# Patient Record
Sex: Female | Born: 1995 | Race: Black or African American | Hispanic: No | Marital: Single | Smoking: Never smoker
Health system: Southern US, Community
[De-identification: ages and names within clinical notes are randomized; demographics above are authoritative.]

## PROBLEM LIST (undated history)

## (undated) DIAGNOSIS — F99 Mental disorder, not otherwise specified: Secondary | ICD-10-CM

## (undated) DIAGNOSIS — J45909 Unspecified asthma, uncomplicated: Secondary | ICD-10-CM

## (undated) HISTORY — DX: Unspecified asthma, uncomplicated: J45.909

---
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
## 2015-01-01 ENCOUNTER — Ambulatory Visit (INDEPENDENT_AMBULATORY_CARE_PROVIDER_SITE_OTHER): Payer: PRIVATE HEALTH INSURANCE | Admitting: Certified Nurse Midwife

## 2015-01-01 ENCOUNTER — Encounter: Payer: Self-pay | Admitting: Certified Nurse Midwife

## 2015-01-01 ENCOUNTER — Encounter: Payer: Self-pay | Admitting: Obstetrics

## 2015-01-01 VITALS — BP 111/59 | HR 64 | Temp 98.2°F | Ht 64.0 in | Wt 155.0 lb

## 2015-01-01 DIAGNOSIS — Z862 Personal history of diseases of the blood and blood-forming organs and certain disorders involving the immune mechanism: Secondary | ICD-10-CM

## 2015-01-01 DIAGNOSIS — Z01419 Encounter for gynecological examination (general) (routine) without abnormal findings: Secondary | ICD-10-CM | POA: Diagnosis not present

## 2015-01-01 DIAGNOSIS — N939 Abnormal uterine and vaginal bleeding, unspecified: Secondary | ICD-10-CM

## 2015-01-01 DIAGNOSIS — F3281 Premenstrual dysphoric disorder: Secondary | ICD-10-CM

## 2015-01-01 DIAGNOSIS — Z113 Encounter for screening for infections with a predominantly sexual mode of transmission: Secondary | ICD-10-CM

## 2015-01-01 LAB — COMPREHENSIVE METABOLIC PANEL
ALT: 13 U/L (ref 5–32)
AST: 21 U/L (ref 12–32)
Albumin: 4.3 g/dL (ref 3.6–5.1)
Alkaline Phosphatase: 61 U/L (ref 47–176)
BUN: 11 mg/dL (ref 7–20)
CHLORIDE: 101 mmol/L (ref 98–110)
CO2: 25 mmol/L (ref 20–31)
CREATININE: 0.9 mg/dL (ref 0.50–1.00)
Calcium: 9.7 mg/dL (ref 8.9–10.4)
GLUCOSE: 77 mg/dL (ref 65–99)
Potassium: 3.8 mmol/L (ref 3.8–5.1)
Sodium: 136 mmol/L (ref 135–146)
Total Bilirubin: 0.5 mg/dL (ref 0.2–1.1)
Total Protein: 7.4 g/dL (ref 6.3–8.2)

## 2015-01-01 LAB — CBC WITH DIFFERENTIAL/PLATELET
BASOS PCT: 0 % (ref 0–1)
Basophils Absolute: 0 10*3/uL (ref 0.0–0.1)
EOS ABS: 0.1 10*3/uL (ref 0.0–0.7)
Eosinophils Relative: 1 % (ref 0–5)
HCT: 36.3 % (ref 36.0–46.0)
Hemoglobin: 11.9 g/dL — ABNORMAL LOW (ref 12.0–15.0)
Lymphocytes Relative: 31 % (ref 12–46)
Lymphs Abs: 2.6 10*3/uL (ref 0.7–4.0)
MCH: 29 pg (ref 26.0–34.0)
MCHC: 32.8 g/dL (ref 30.0–36.0)
MCV: 88.3 fL (ref 78.0–100.0)
MONO ABS: 0.4 10*3/uL (ref 0.1–1.0)
MONOS PCT: 5 % (ref 3–12)
MPV: 8.9 fL (ref 8.6–12.4)
NEUTROS PCT: 63 % (ref 43–77)
Neutro Abs: 5.3 10*3/uL (ref 1.7–7.7)
PLATELETS: 347 10*3/uL (ref 150–400)
RBC: 4.11 MIL/uL (ref 3.87–5.11)
RDW: 13.8 % (ref 11.5–15.5)
WBC: 8.4 10*3/uL (ref 4.0–10.5)

## 2015-01-01 LAB — TSH: TSH: 0.404 u[IU]/mL (ref 0.350–4.500)

## 2015-01-01 MED ORDER — FLUOXETINE HCL (PMDD) 20 MG PO TABS: 1.0000 | ORAL_TABLET | Freq: Every day | ORAL | Status: DC

## 2015-01-01 NOTE — Progress Notes (Signed)
Patient ID: Lynn Gonzales, female   DOB: 1995/10/25, 19 y.o.   MRN: [id number]    Subjective:      Lynn ParcelKerani Palmero is a 19 y.o. female here for a routine exam.  Current complaints: cramping with periods, has a hx of ovarian cysts.  Monthly periods, lasting about 7-9 days, soaking a pad/tampon in an hour for the first several days, clots about dime sized.  Does have a hx of anemia with her periods. Has not been on birth control since 2012.  Takes midol for the cramping, sometimes it helps sometimes it does not help with the pain.   Currently sexually active, does not desire pregnancy at this time.    Personal health questionnaire:  Is patient Ashkenazi Jewish, have a family history of breast and/or ovarian cancer: yes; MGM ovarian Is there a family history of uterine cancer diagnosed at age < 8050, gastrointestinal cancer, urinary tract cancer, family member who is a Personnel officerLynch syndrome-associated carrier: no Is the patient overweight and hypertensive, family history of diabetes, personal history of gestational diabetes, preeclampsia or PCOS: no Is patient over 3255, have PCOS,  family history of premature CHD under age 19, diabetes, smoke, have hypertension or peripheral artery disease:  DM MGM, PGM At any time, has a partner hit, kicked or otherwise hurt or frightened you?: no Over the past 2 weeks, have you felt down, depressed or hopeless?: yes when she is on her cycle Over the past 2 weeks, have you felt little interest or pleasure in doing things?:no   Gynecologic History Patient's last menstrual period was 11/20/2014. Contraception: none Last Pap: N/A.  Last mammogram: N/A.   Obstetric History OB History  Gravida Para Term Preterm AB SAB TAB Ectopic Multiple Living  0 0 0 0 0 0 0 0 0 0         Past Medical History  Diagnosis Date  . Asthma     Past Surgical History  Procedure Laterality Date  . Ovarian cyst removal Left 2011  . Tonsillectomy  2015  . Anterior cruciate ligament  repair Left 2015    No current outpatient prescriptions on file. No Known Allergies  Social History  Substance Use Topics  . Smoking status: Never Smoker   . Smokeless tobacco: Not on file  . Alcohol Use: 0.0 oz/week    0 Standard drinks or equivalent per week     Comment: Socially    History reviewed. No pertinent family history.    Review of Systems  Constitutional: negative for fatigue and weight loss Respiratory: negative for cough and wheezing Cardiovascular: negative for chest pain, fatigue and palpitations Gastrointestinal: negative for abdominal pain and change in bowel habits Musculoskeletal:negative for myalgias Neurological: negative for gait problems and tremors Behavioral/Psych: negative for abusive relationship, depression Endocrine: negative for temperature intolerance   Genitourinary:negative for abnormal menstrual periods, genital lesions, hot flashes, sexual problems and vaginal discharge Integument/breast: negative for breast lump, breast tenderness, nipple discharge and skin lesion(s)    Objective:       BP 111/59 mmHg  Pulse 64  Temp(Src) 98.2 F (36.8 C)  Ht 5\' 4"  (1.626 m)  Wt 155 lb (70.308 kg)  BMI 26.59 kg/m2  LMP 11/20/2014 General:   alert  Skin:   no rash or abnormalities  Lungs:   clear to auscultation bilaterally  Heart:   regular rate and rhythm, S1, S2 normal, no murmur, click, rub or gallop  Breasts:   normal without suspicious masses, skin or nipple changes or axillary  nodes  Abdomen:  normal findings: no organomegaly, soft, non-tender and no hernia  Pelvis:  External genitalia: normal general appearance Urinary system: urethral meatus normal and bladder without fullness, nontender Vaginal: normal without tenderness, induration or masses Cervix: normal appearance Adnexa: normal bimanual exam Uterus: anteverted and non-tender, normal size   Lab Review Urine pregnancy test Labs reviewed yes Radiologic studies reviewed no  50%  of 30 min visit spent on counseling and coordination of care.   Assessment:    Healthy female exam.    AUB  Contraception management  H/O ovarian cysts  H/O anemia Plan:    Education reviewed: depression evaluation, low fat, low cholesterol diet, safe sex/STD prevention, self breast exams, skin cancer screening and weight bearing exercise. Follow up in: 2 weeks. Skyla insertion   No orders of the defined types were placed in this encounter.   No orders of the defined types were placed in this encounter.   Possible management options include: continuous OCPs

## 2015-01-02 LAB — RPR

## 2015-01-02 LAB — HIV ANTIBODY (ROUTINE TESTING W REFLEX): HIV: NONREACTIVE

## 2015-01-02 LAB — HEPATITIS C ANTIBODY: HCV AB: NEGATIVE

## 2015-01-02 LAB — HEPATITIS B SURFACE ANTIGEN: Hepatitis B Surface Ag: NEGATIVE

## 2015-01-06 ENCOUNTER — Telehealth: Payer: Self-pay | Admitting: *Deleted

## 2015-01-06 ENCOUNTER — Other Ambulatory Visit: Payer: Self-pay | Admitting: Certified Nurse Midwife

## 2015-01-06 DIAGNOSIS — N760 Acute vaginitis: Secondary | ICD-10-CM

## 2015-01-06 DIAGNOSIS — B9689 Other specified bacterial agents as the cause of diseases classified elsewhere: Secondary | ICD-10-CM

## 2015-01-06 LAB — SURESWAB, VAGINOSIS/VAGINITIS PLUS
ATOPOBIUM VAGINAE: NOT DETECTED Log (cells/mL)
BV CATEGORY: UNDETERMINED — AB
C. albicans, DNA: NOT DETECTED
C. glabrata, DNA: NOT DETECTED
C. parapsilosis, DNA: NOT DETECTED
C. trachomatis RNA, TMA: NOT DETECTED
C. tropicalis, DNA: NOT DETECTED
GARDNERELLA VAGINALIS: 7.1 Log (cells/mL)
LACTOBACILLUS SPECIES: 7.4 Log (cells/mL)
MEGASPHAERA SPECIES: NOT DETECTED Log (cells/mL)
N. gonorrhoeae RNA, TMA: NOT DETECTED
T. VAGINALIS RNA, QL TMA: NOT DETECTED

## 2015-01-06 MED ORDER — METRONIDAZOLE 500 MG PO TABS: 500.0000 mg | ORAL_TABLET | Freq: Two times a day (BID) | ORAL | Status: DC

## 2015-01-06 NOTE — Telephone Encounter (Signed)
Patient is interested in the [place]inst[place]yla IUD. Per Rachelle Denney,CNM okay to schedule patient in 2 weeks from last appointment. Patient states she is currently sexually active and using condoms for protection. Patient states her last intercourse was 01-03-15. Patient advised no unprotected intercourse until after insertion of her device. Patient verbalized understanding. Patient has been scheduled for 01/15/15 @ 9:30 am.

## 2015-01-15 ENCOUNTER — Encounter: Payer: Self-pay | Admitting: Certified Nurse Midwife

## 2015-01-15 ENCOUNTER — Encounter: Payer: Self-pay | Admitting: Obstetrics

## 2015-01-15 ENCOUNTER — Ambulatory Visit (INDEPENDENT_AMBULATORY_CARE_PROVIDER_SITE_OTHER): Payer: PRIVATE HEALTH INSURANCE | Admitting: Certified Nurse Midwife

## 2015-01-15 VITALS — BP 114/58 | HR 52 | Temp 98.4°F | Wt 156.0 lb

## 2015-01-15 DIAGNOSIS — Z3202 Encounter for pregnancy test, result negative: Secondary | ICD-10-CM

## 2015-01-15 DIAGNOSIS — Z30430 Encounter for insertion of intrauterine contraceptive device: Secondary | ICD-10-CM | POA: Diagnosis not present

## 2015-01-15 DIAGNOSIS — Z01812 Encounter for preprocedural laboratory examination: Secondary | ICD-10-CM

## 2015-01-15 NOTE — Progress Notes (Signed)
Patient ID: Durward ParcelKerani Bless, female   DOB: [birth year], 19 y.o.   MRN: [id number]  IUD Procedure Note   DIAGNOSIS: Desires long-term, reversible contraception   PROCEDURE: IUD placement Performing Provider: Dory HornAmy Wren CNM  Patient counseled prior to procedure. I explained risks and benefits of Skyla IUD, reviewed alternative forms of contraception. Patient stated understanding and consented to continue with procedure.   LMP: 01/08/15 Pregnancy Test: Negative Lot #: [id number] Expiration Date: 02/17   IUD type: [   ] Mirena   [   ] Paraguard  [ X ] Skyla  PROCEDURE:  Timeout procedure was performed to ensure right patient and right site.  A bimanual exam was performed to determine the position of the uterus, retroverted. The speculum was placed. The vagina and cervix was sterilized in the usual manner and sterile technique was maintained throughout the course of the procedure. A single toothed tenaculum was applied to the posterior lip of the cervix and gentle traction applied. The depth of the uterus was sounded to 8 cm. With gentle traction on the tenaculum, the IUD was inserted to the appropriate depth and inserted without difficulty.  The string was cut to an estimated 4 cm length. Bleeding was minimal. The patient tolerated the procedure well.   Follow up: The patient tolerated the procedure well without complications.  Standard post-procedure care is explained and return precautions are given.  Orvilla Cornwallachelle Ranay Ketter CNM

## 2015-02-21 ENCOUNTER — Ambulatory Visit (INDEPENDENT_AMBULATORY_CARE_PROVIDER_SITE_OTHER): Payer: PRIVATE HEALTH INSURANCE | Admitting: Certified Nurse Midwife

## 2015-02-21 ENCOUNTER — Ambulatory Visit: Payer: PRIVATE HEALTH INSURANCE | Admitting: Certified Nurse Midwife

## 2015-02-21 ENCOUNTER — Encounter: Payer: Self-pay | Admitting: Certified Nurse Midwife

## 2015-02-21 VITALS — BP 111/72 | HR 55 | Temp 98.2°F | Wt 160.0 lb

## 2015-02-21 DIAGNOSIS — Z975 Presence of (intrauterine) contraceptive device: Secondary | ICD-10-CM

## 2015-02-21 DIAGNOSIS — Z30431 Encounter for routine checking of intrauterine contraceptive device: Secondary | ICD-10-CM | POA: Diagnosis not present

## 2015-02-21 NOTE — Progress Notes (Signed)
Patient ID: Lynn Gonzales, female   DOB: 1996/02/06, 20 y.o.   MRN: [id number]  Chief Complaint  Patient presents with  . Follow-up    skyla, bleeding since inserted, now spotting, some cramping    HPI Lynn ParcelKerani Oquendo is a 20 y.o. female.  Here for f/u for her Skyla IUD.  Patient states that she likes the [place]yla.  Reports prolonged first period of about 18 days, denies any heavy bleeding and has been spotting since.  Her dysmenorrhea was worse since the IUD was placed with increased cramping the week before her period and then continuing the week after her period.  Has not taken anything for the cramping/bleeding.  Encouragement given to the patient about the normalcy of the spotting/cramping and to give the device about 6 months.  If she is continuing to have troublesome bleeding/cramping with the motrin to call the office.    HPI  Past Medical History  Diagnosis Date  . Asthma     Past Surgical History  Procedure Laterality Date  . Ovarian cyst removal Left 2011  . Tonsillectomy  2015  . Anterior cruciate ligament repair Left 2015    No family history on file.  Social History Social History  Substance Use Topics  . Smoking status: Never Smoker   . Smokeless tobacco: Not on file  . Alcohol Use: 0.0 oz/week    0 Standard drinks or equivalent per week     Comment: Socially    No Known Allergies  Current Outpatient Prescriptions  Medication Sig Dispense Refill  . Fluoxetine HCl, PMDD, (SARAFEM) 20 MG TABS Take 1 tablet (20 mg total) by mouth daily. For the 2 weeks around your period (Patient not taking: Reported on 01/15/2015) 30 each 12   No current facility-administered medications for this visit.    Review of Systems Review of Systems Constitutional: negative for fatigue and weight loss Respiratory: negative for cough and wheezing Cardiovascular: negative for chest pain, fatigue and palpitations Gastrointestinal: negative for abdominal pain and change in bowel  habits Genitourinary: +spotting Integument/breast: negative for nipple discharge Musculoskeletal:negative for myalgias Neurological: negative for gait problems and tremors Behavioral/Psych: negative for abusive relationship, depression Endocrine: negative for temperature intolerance     Blood pressure 111/72, pulse 55, temperature 98.2 F (36.8 C), weight 160 lb (72.576 kg), last menstrual period 02/03/2015.  Physical Exam Physical Exam General:   alert  Skin:   no rash or abnormalities  Lungs:   clear to auscultation bilaterally  Heart:   regular rate and rhythm, S1, S2 normal, no murmur, click, rub or gallop  Breasts:   deferred  Abdomen:  normal findings: no organomegaly, soft, non-tender and no hernia  Pelvis:  External genitalia: normal general appearance Urinary system: urethral meatus normal and bladder without fullness, nontender Vaginal: normal without tenderness, induration or masses Cervix: normal appearance, +IUD strings present Adnexa: normal bimanual exam Uterus: anteverted and non-tender, normal size    50% of 15 min visit spent on counseling and coordination of care.   Data Reviewed Previous medical hx, meds  Assessment     IUD in place     Plan    No orders of the defined types were placed in this encounter.   No orders of the defined types were placed in this encounter.     Possible management options include: additional OCPs to stop bleeding.   Follow up as needed.

## 2015-03-31 ENCOUNTER — Ambulatory Visit (HOSPITAL_COMMUNITY)
Admission: RE | Admit: 2015-03-31 | Discharge: 2015-03-31 | Disposition: A | Discharge status: Home / Self Care | Payer: PRIVATE HEALTH INSURANCE | Attending: Psychiatry | Admitting: Psychiatry

## 2015-03-31 DIAGNOSIS — F331 Major depressive disorder, recurrent, moderate: Secondary | ICD-10-CM | POA: Diagnosis not present

## 2015-03-31 DIAGNOSIS — F419 Anxiety disorder, unspecified: Secondary | ICD-10-CM | POA: Diagnosis not present

## 2015-03-31 DIAGNOSIS — J45909 Unspecified asthma, uncomplicated: Secondary | ICD-10-CM | POA: Diagnosis not present

## 2015-03-31 NOTE — BH Assessment (Signed)
Tele Assessment Note   Lynn Gonzales is an 20 y.o. female.  -Patient presents as a walk-in to [facility].  She is unaccompanied.  Patient said that over the last two weeks she has had increased depression and anxiety.  She has had some suicidal thoughts but no intention or plan.  She says it is thoughts like "not wanting to be here."  She said it is more of a feeling that she wants to be rid of her anxiety and depression.  She has had one suicide attempt two years ago.  She did have thoughts and intention & plan back in July but went to see her current outpatient counselor and did not come inpatient.  Patient denies any HI or A/V hallucinations.  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  She lives off campus with a roommate that she does not have a close relationship with.  Patient is having anxiety over test taking and that some of her grades are slipping.  Patient says that she averages about two panic attacks per week but that today she had four.  Patient has had outpatient therapy since high school.  She has a Social worker at [facility] counseling.  She has an appointment tomorrow (02/14) at 15:30.  Patient was at a [facility] in [place] for inpatient care two years ago after a suicide attempt.  Patient is wanting to get outpatient referrals for psychiatry.  She thinks that an anti-anxiety medication may help her.  Patient is not interested in inpt care at this time.  -Clinician discussed patient care with Donell Sievert, PA.  He did not think that patient meets inpatient care criteria.  Recommended outpatient referrals for psychiatry.  Patient was given outpatient referrals and said she will contact the provider of her choice tomorrow.  Diagnosis: MDD recurrent, moderate; Anxiety D/O   Past Medical History:  Past Medical History  Diagnosis Date  . Asthma     Past Surgical History  Procedure Laterality Date  . Ovarian cyst removal  Left 2011  . Tonsillectomy  2015  . Anterior cruciate ligament repair Left 2015    Family History: No family history on file.  Social History:  reports that she has never smoked. She does not have any smokeless tobacco history on file. She reports that she drinks alcohol. She reports that she does not use illicit drugs.  Additional Social History:  Alcohol / Drug Use Pain Medications: None Prescriptions: None Over the Counter: N/A History of alcohol / drug use?: No history of alcohol / drug abuse  CIWA:   COWS:    PATIENT STRENGTHS: (choose at least two) Ability for insight Active sense of humor Average or above average intelligence Capable of independent living Communication skills General fund of knowledge Motivation for treatment/growth Supportive family/friends  Allergies: No Known Allergies  Home Medications:  (Not in a hospital admission)  OB/GYN Status:  No LMP recorded.  General Assessment Data Location of Assessment[facility] TTS Assessment: In system Is this a Tele or Face-to-Face Assessment?: Face-to-Face Is this an Initial Assessment or a Re-assessment for this encounter?: Initial Assessment Marital status: Single Is patient pregnant?: No Pregnancy Status: No Living Arrangements: Non-relatives/Friends (Roommate.  Lives off campus from [employer]) Can pt return to current living arrangement?: Yes Admission Status: Voluntary Is patient capable of signing voluntary admission?: Yes Referral Source: Self/Family/Friend Insurance type: GHI  Medical Screening Exam [facility] Walk-in ONLY) Medical Exam completed: No Reason for  MSE not completed: Patient Refused (Pt signed a MSE declination form.)  Crisis Care Plan Living Arrangements: Non-relatives/Friends (Roommate.  Lives off campus from [employer]) Name of Psychiatrist: None Name of Therapist: counselor [place] w/ [employer] student counseling  Education Status Is patient currently in school?: Yes Current  Grade: Junior year Name of school: [employer] Contact person: patient  Risk to self with the past 6 months Suicidal Ideation: No-Not Currently/Within Last 6 Months Has patient been a risk to self within the past 6 months prior to admission? : No Suicidal Intent: No-Not Currently/Within Last 6 Months Has patient had any suicidal intent within the past 6 months prior to admission? : Yes Is patient at risk for suicide?: No Suicidal Plan?: No-Not Currently/Within Last 6 Months Has patient had any suicidal plan within the past 6 months prior to admission? : Yes Access to Means: No What has been your use of drugs/alcohol within the last 12 months?: Pt denies Previous Attempts/Gestures: Yes How many times?: 1 Other Self Harm Risks: None Triggers for Past Attempts: Family contact, Other (Comment) (School pressure.) Intentional Self Injurious Behavior: None Family Suicide History: No Recent stressful life event(s): Other (Comment) (School pressure) Persecutory voices/beliefs?: No Depression: Yes Depression Symptoms: Despondent, Tearfulness, Isolating, Loss of interest in usual pleasures, Feeling angry/irritable Substance abuse history and/or treatment for substance abuse?: No Suicide prevention information given to non-admitted patients: Not applicable  Risk to Others within the past 6 months Homicidal Ideation: No Does patient have any lifetime risk of violence toward others beyond the six months prior to admission? : No Thoughts of Harm to Others: No Current Homicidal Intent: No Current Homicidal Plan: No Access to Homicidal Means: No Identified Victim: No one History of harm to others?: No Assessment of Violence: None Noted Violent Behavior Description: Pt denies Does patient have access to weapons?: No Criminal Charges Pending?: No Does patient have a court date: No Is patient on probation?: No  Psychosis Hallucinations: None noted Delusions: None noted  Mental Status  Report Appearance/Hygiene: Unremarkable Eye Contact: Good Motor Activity: Freedom of movement, Unremarkable Speech: Logical/coherent Level of Consciousness: Alert Mood: Depressed, Anxious Affect: Anxious, Sad Anxiety Level: Panic Attacks Panic attack frequency: 2x/W. Most recent panic attack: Four today Thought Processes: Coherent, Relevant Judgement: Unimpaired Orientation: Person, Place, Time, Situation, Appropriate for developmental age Obsessive Compulsive Thoughts/Behaviors: None  Cognitive Functioning Concentration: Decreased Memory: Recent Intact, Remote Intact IQ: Above Average Insight: Good Impulse Control: Good Appetite: Fair Weight Loss: 0 Weight Gain: 0 Sleep: No Change Total Hours of Sleep:  (4H/D last week.  8-9 this week.) Vegetative Symptoms: None  ADLScreening [facility] Assessment Services) Patient's cognitive ability adequate to safely complete daily activities?: Yes Patient able to express need for assistance with ADLs?: Yes Independently performs ADLs?: Yes (appropriate for developmental age)  Prior Inpatient Therapy Prior Inpatient Therapy: Yes Prior Therapy Dates: 2 years ago Prior Therapy Facilty/Provider(s): [facility] in [place] Reason for Treatment: SI.  Attempted overdose  Prior Outpatient Therapy Prior Outpatient Therapy: Yes Prior Therapy Dates: Since high school; current since July '16 Prior Therapy Facilty/Provider(s): Various counselors; Copy at [facility] counseling Reason for Treatment: Depression & anxiety Does patient have an ACCT team?: No Does patient have Intensive In-House Services?  : No Does patient have [place] services? : No Does patient have P4CC services?: No  ADL Screening (condition at time of admission) Patient's cognitive ability adequate to safely complete daily activities?: Yes Is the patient deaf or have difficulty hearing?: No Does the patient have  difficulty seeing, even when wearing  glasses/contacts?: No Does the patient have difficulty concentrating, remembering, or making decisions?: No Patient able to express need for assistance with ADLs?: Yes Does the patient have difficulty dressing or bathing?: No Independently performs ADLs?: Yes (appropriate for developmental age) Does the patient have difficulty walking or climbing stairs?: No Weakness of Legs: None Weakness of Arms/Hands: None       Abuse/Neglect Assessment (Assessment to be complete while patient is alone) Physical Abuse: Denies Verbal Abuse: Denies Sexual Abuse: Denies Exploitation of patient/patient's resources: Denies Self-Neglect: Denies     Merchant navy officer (For Healthcare) Does patient have an advance directive?: No Would patient like information on creating an advanced directive?: No - patient declined information    Additional Information 1:1 In Past 12 Months?: No CIRT Risk: No Elopement Risk: No Does patient have medical clearance?: Yes     Disposition:  Disposition Initial Assessment Completed for this Encounter: Yes Disposition of Patient: Outpatient treatment (Pt given outpatient referrals for psychiatry.) Type of outpatient treatment: Adult  Alexandria Lodge 03/31/2015 10:53 PM

## 2015-06-16 ENCOUNTER — Telehealth: Payer: Self-pay | Admitting: *Deleted

## 2015-06-16 NOTE — Telephone Encounter (Signed)
[facility] and her bleeding. 5:43 Mailbox is full- no message left

## 2015-06-17 ENCOUNTER — Other Ambulatory Visit: Payer: Self-pay | Admitting: Certified Nurse Midwife

## 2015-06-17 NOTE — Telephone Encounter (Signed)
Patient states she is having issues with her Skyla. She has been continuously bleeding for 3 months. Patient has bled daily. Patient is using around 4 super tampons a day. Patient has had Skyla since November- bleeding stopped for about 1 month. Patient needs to stop bleeding. Told her I would speak with her provider and get back to her on a plan.

## 2015-06-17 NOTE — Telephone Encounter (Signed)
9:23 Patient called back- left message. 11:30 Mailbox is full- can't leave message.

## 2015-06-24 ENCOUNTER — Telehealth: Payer: Self-pay | Admitting: *Deleted

## 2015-06-24 NOTE — Telephone Encounter (Signed)
Please offer either LOLO or nuva ring.  Thank you.

## 2015-06-24 NOTE — Telephone Encounter (Signed)
Patient called for a response about what to do about her constant bleeding. 5/9 10:11 Spoke with Boykin Reaperachelle- Choices for patient are Provera, OCP or Nuva ring. All depends on how heavy the bleeding is.  Attempt to call patient- mailbox full. Lo Lo if bleeding is not heavy per Rachelle.

## 2015-07-17 ENCOUNTER — Telehealth: Payer: Self-pay | Admitting: *Deleted

## 2015-07-17 NOTE — Telephone Encounter (Signed)
Patient reports she is still bleeding Skyla/OCP. 5:10 Patient states the bleeding only stopped 1 day- she is using LOLOEstrin and she is having SE- dizziness, decreased appetite, and emotional, acne increase. Patient is just starting the 3rd week- bleeding is lighter.

## 2015-07-18 ENCOUNTER — Other Ambulatory Visit: Payer: Self-pay | Admitting: Certified Nurse Midwife

## 2015-07-18 DIAGNOSIS — N926 Irregular menstruation, unspecified: Secondary | ICD-10-CM

## 2015-07-18 DIAGNOSIS — L709 Acne, unspecified: Secondary | ICD-10-CM

## 2015-07-18 MED ORDER — DOXYCYCLINE HYCLATE 50 MG PO CAPS: 50.0000 mg | ORAL_CAPSULE | Freq: Two times a day (BID) | ORAL | Status: DC

## 2015-07-18 NOTE — Telephone Encounter (Signed)
Hi; Please let her know that I have sent in doxycycline for the irregular bleeding with Skyla and to stop the LoLo.   Thank you.  R.Bluma Buresh CNM

## 2015-08-01 NOTE — Telephone Encounter (Signed)
Lynn SquibbJane Can you follow up with pt regarding bleeding.

## 2015-08-05 NOTE — Telephone Encounter (Signed)
Attemted to call patient- call dropped - then called her back and got mailbox full message.

## 2015-09-15 ENCOUNTER — Inpatient Hospital Stay (HOSPITAL_COMMUNITY)
Admission: EM | Admit: 2015-09-15 | Discharge: 2015-09-17 | DRG: 918 | Disposition: A | Discharge status: Other Acute Inpatient Hospital | Payer: BLUE CROSS/BLUE SHIELD | Attending: Internal Medicine | Admitting: Internal Medicine

## 2015-09-15 ENCOUNTER — Encounter (HOSPITAL_COMMUNITY): Payer: Self-pay | Admitting: *Deleted

## 2015-09-15 DIAGNOSIS — R45851 Suicidal ideations: Secondary | ICD-10-CM

## 2015-09-15 DIAGNOSIS — F909 Attention-deficit hyperactivity disorder, unspecified type: Secondary | ICD-10-CM | POA: Diagnosis present

## 2015-09-15 DIAGNOSIS — T1491 Suicide attempt: Secondary | ICD-10-CM | POA: Diagnosis not present

## 2015-09-15 DIAGNOSIS — F339 Major depressive disorder, recurrent, unspecified: Secondary | ICD-10-CM | POA: Diagnosis present

## 2015-09-15 DIAGNOSIS — T1491XA Suicide attempt, initial encounter: Secondary | ICD-10-CM

## 2015-09-15 DIAGNOSIS — T6592XA Toxic effect of unspecified substance, intentional self-harm, initial encounter: Principal | ICD-10-CM | POA: Diagnosis present

## 2015-09-15 DIAGNOSIS — T6591XA Toxic effect of unspecified substance, accidental (unintentional), initial encounter: Secondary | ICD-10-CM | POA: Diagnosis present

## 2015-09-15 HISTORY — DX: Mental disorder, not otherwise specified: F99

## 2015-09-15 LAB — I-STAT ARTERIAL BLOOD GAS, ED
ACID-BASE DEFICIT: 1 mmol/L (ref 0.0–2.0)
BICARBONATE: 24.1 meq/L — AB (ref 20.0–24.0)
O2 Saturation: 97 %
PH ART: 7.397 (ref 7.350–7.450)
TCO2: 25 mmol/L (ref 0–100)
pCO2 arterial: 39.2 mmHg (ref 35.0–45.0)
pO2, Arterial: 90 mmHg (ref 80.0–100.0)

## 2015-09-15 LAB — POC URINE PREG, ED: PREG TEST UR: NEGATIVE

## 2015-09-15 MED ORDER — SODIUM CHLORIDE 0.9 % IV SOLN
INTRAVENOUS | Status: DC
  Administered 2015-09-15: via INTRAVENOUS

## 2015-09-15 MED ORDER — THIAMINE HCL 100 MG/ML IJ SOLN
100.0000 mg | Freq: Four times a day (QID) | INTRAMUSCULAR | Status: DC
  Administered 2015-09-16 – 2015-09-17 (×6): 100 mg via INTRAVENOUS
  Filled 2015-09-15 (×6): qty 2

## 2015-09-15 MED ORDER — PYRIDOXINE HCL 100 MG/ML IJ SOLN
100.0000 mg | Freq: Four times a day (QID) | INTRAMUSCULAR | Status: DC
  Filled 2015-09-15 (×2): qty 1

## 2015-09-15 MED ORDER — SODIUM CHLORIDE 0.9 % IV SOLN
1000.0000 mg | Freq: Once | INTRAVENOUS | Status: AC
  Administered 2015-09-16: 1000 mg via INTRAVENOUS
  Filled 2015-09-15: qty 1

## 2015-09-15 NOTE — ED Triage Notes (Signed)
The pt has no pain or discomfort  She has no burns on her tongue  Salivation is normal  Sitter requested   Chg rn notified of sitter need  Pt undressed and placed into  [place] scrubs    Pt discussed with dr ward  Pt smiling happy

## 2015-09-15 NOTE — ED Notes (Signed)
EDP at bedside  

## 2015-09-15 NOTE — ED Triage Notes (Signed)
The pt rfeports that she attempted suic ide 4 hours ago  By ingesting antifreeze  She has no pain anywhere and she s not sure how much or if she drank  lmp July 3rd

## 2015-09-15 NOTE — ED Provider Notes (Addendum)
By signing my name below, I, Vista Mink, attest that this documentation has been prepared under the direction and in the presence of Garrie Woodin N Briannon Boggio, DO. Electronically signed, Vista Mink, ED Scribe. 09/15/15. 11:44 PM.  TIME SEEN: 11:33 PM  CHIEF COMPLAINT: SI  HPI:  HPI Comments: Lynn Gonzales is a 20 y.o. female, who presents to the Emergency Department s/p a suicide attempt that occurred at around 1900 this evening. Pt states she drank approximately 8oz of antifreeze to attempt to kill herself; denies ingesting any other substances. Pt states she called her parents who told her to go to the hospital. Pt reports she had a previous suicide attempt in 2013 by attempting to overdose. Pt states she currently takes Adderall prn. Pt currently complains of a mild headache but denies any other pain. Pt denies any drug or alcohol use. Pt denies any changes in vision, nausea, vomiting, diarrhea, chest pain, shortness of breath, abdominal pain.  Levin Bacon Burkette: Psychiatrist Ms. Fayrene Fearing: Therapist PCP - in [place]   ROS: See HPI Constitutional: no fever  Eyes: no drainage  ENT: no runny nose   Cardiovascular:  no chest pain  Resp: no SOB  GI: no vomiting GU: no dysuria Integumentary: no rash  Allergy: no hives  Musculoskeletal: no leg swelling  Neurological: no slurred speech ROS otherwise negative  PAST MEDICAL HISTORY/PAST SURGICAL HISTORY:  Past Medical History:  Diagnosis Date  . Asthma   . Mental disorder     MEDICATIONS:  Prior to Admission medications   Medication Sig Start Date End Date Taking? Authorizing Provider  doxycycline (VIBRAMYCIN) 50 MG capsule Take 1 capsule (50 mg total) by mouth 2 (two) times daily. 07/18/15   Rachelle A Denney, CNM  Fluoxetine HCl, PMDD, (SARAFEM) 20 MG TABS Take 1 tablet (20 mg total) by mouth daily. For the 2 weeks around your period Patient not taking: Reported on 01/15/2015 01/01/15   Roe Coombs, CNM    ALLERGIES:  No Known  Allergies  SOCIAL HISTORY:  Social History  Substance Use Topics  . Smoking status: Never Smoker  . Smokeless tobacco: Never Used  . Alcohol use 0.0 oz/week     Comment: Socially    FAMILY HISTORY: No family history on file.  EXAM: BP 123/70   Pulse 60   Temp 98.6 F (37 C)   Resp 16   Ht  (1.626 m)   Wt 156 lb (70.8 kg)   LMP 09/08/2015   SpO2 99%   BMI 26.78 kg/m  CONSTITUTIONAL: Alert and oriented and responds appropriately to questions. Well-appearing; well-nourished HEAD: Normocephalic EYES: Conjunctivae clear, PERRL ENT: normal nose; no rhinorrhea; moist mucous membranes NECK: Supple, no meningismus, no LAD  CARD: RRR; S1 and S2 appreciated; no murmurs, no clicks, no rubs, no gallops RESP: Normal chest excursion without splinting or tachypnea; breath sounds clear and equal bilaterally; no wheezes, no rhonchi, no rales, no hypoxia or respiratory distress, speaking full sentences ABD/GI: Normal bowel sounds; non-distended; soft, non-tender, no rebound, no guarding, no peritoneal signs BACK:  The back appears normal and is non-tender to palpation, there is no CVA tenderness EXT: Normal ROM in all joints; non-tender to palpation; no edema; normal capillary refill; no cyanosis, no calf tenderness or swelling    SKIN: Normal color for age and race; warm; no rash NEURO: Moves all extremities equally, sensation to light touch intact diffusely, cranial nerves II through XII intact PSYCH: The patient's mood and manner are appropriate. Grooming and personal hygiene are  appropriate.  MEDICAL DECISION MAKING: Patient here with ingestion of antifreeze at 7 PM. Reports she thinks she drank a "cup" that she describes as 8 ounces. No other coingestions. No current medical complaints other than mild diffuse headache. Neurologically intact, hemodynamically stable. We'll discuss with poison control. Will obtain labs, urine, EKG.  ED PROGRESS: Discussed with Lawson Fiscal with poison control.  She recommends sending a toxic alcohol panel stat to [facility]. I have discussed this with nursing staff, laboratory staff. Laboratory staff will contact the courier to take this lab directly to [facility]. She also recommends obtaining an ABG. Will give fomepizole 15 mg/kg now - discussed with pharmacist who is making medication immediately. Will also give thiamine 100 mg IV every 6 hours and folic acid 50 mg IV every 6 hours per poison control recommendations.    12:30 AM  Pt is still hemodynamically stable without complaints.  1:00 AM  Pt's labs are unremarkable. Tylenol and salicylate levels negative. Will admit to medicine.   1:10 AM  D/w Dr. Toniann Fail with hospitalist service. He will discuss with critical care to see if patient would be better served in the ICU. Patient to be admitted. She is still stable at this time.  1:18 AM  D/w hospitalist who recommends admission to step down, observation. I will place holding orders per his request. Care transferred to hospitalist service.   EKG Interpretation  Date/Time:  Monday September 15 2015 23:23:55 EDT Ventricular Rate:  57 PR Interval:  138 QRS Duration: 90 QT Interval:  432 QTC Calculation: 420 R Axis:   78 Text Interpretation:  Sinus bradycardia Otherwise normal ECG No old tracing to compare Confirmed by Kenzie Thoreson,  DO, Yarenis Cerino ([id number]) on 09/15/2015 11:30:11 PM        CRITICAL CARE Performed by: Raelyn Number   Total critical care time: 35 minutes  Critical care time was exclusive of separately billable procedures and treating other patients.  Critical care was necessary to treat or prevent imminent or life-threatening deterioration.  Critical care was time spent personally by me on the following activities: development of treatment plan with patient and/or surrogate as well as nursing, discussions with consultants, evaluation of patient's response to treatment, examination of patient, obtaining history from patient or surrogate,  ordering and performing treatments and interventions, ordering and review of laboratory studies, ordering and review of radiographic studies, pulse oximetry and re-evaluation of patient's condition.   I personally performed the services described in this documentation, which was scribed in my presence. The recorded information has been reviewed and is accurate.     Layla Maw Briceida Rasberry, DO 09/16/15 0114    Layla Maw Viva Gallaher, DO 09/16/15 8416

## 2015-09-16 ENCOUNTER — Encounter (HOSPITAL_COMMUNITY): Payer: Self-pay | Admitting: Internal Medicine

## 2015-09-16 DIAGNOSIS — T1491 Suicide attempt: Secondary | ICD-10-CM

## 2015-09-16 DIAGNOSIS — F332 Major depressive disorder, recurrent severe without psychotic features: Secondary | ICD-10-CM | POA: Diagnosis not present

## 2015-09-16 DIAGNOSIS — T518X3D Toxic effect of other alcohols, assault, subsequent encounter: Secondary | ICD-10-CM | POA: Diagnosis not present

## 2015-09-16 DIAGNOSIS — R45851 Suicidal ideations: Secondary | ICD-10-CM | POA: Diagnosis not present

## 2015-09-16 DIAGNOSIS — F909 Attention-deficit hyperactivity disorder, unspecified type: Secondary | ICD-10-CM | POA: Diagnosis present

## 2015-09-16 DIAGNOSIS — F339 Major depressive disorder, recurrent, unspecified: Secondary | ICD-10-CM | POA: Diagnosis present

## 2015-09-16 DIAGNOSIS — T6592XA Toxic effect of unspecified substance, intentional self-harm, initial encounter: Secondary | ICD-10-CM | POA: Diagnosis present

## 2015-09-16 DIAGNOSIS — T6591XA Toxic effect of unspecified substance, accidental (unintentional), initial encounter: Secondary | ICD-10-CM | POA: Diagnosis present

## 2015-09-16 DIAGNOSIS — Z915 Personal history of self-harm: Secondary | ICD-10-CM | POA: Diagnosis not present

## 2015-09-16 DIAGNOSIS — F329 Major depressive disorder, single episode, unspecified: Secondary | ICD-10-CM | POA: Diagnosis present

## 2015-09-16 DIAGNOSIS — F322 Major depressive disorder, single episode, severe without psychotic features: Secondary | ICD-10-CM | POA: Diagnosis present

## 2015-09-16 LAB — CBC
HCT: 34.4 % — ABNORMAL LOW (ref 36.0–46.0)
HEMOGLOBIN: 11 g/dL — AB (ref 12.0–15.0)
MCH: 27.8 pg (ref 26.0–34.0)
MCHC: 32 g/dL (ref 30.0–36.0)
MCV: 86.9 fL (ref 78.0–100.0)
Platelets: 335 10*3/uL (ref 150–400)
RBC: 3.96 MIL/uL (ref 3.87–5.11)
RDW: 13.5 % (ref 11.5–15.5)
WBC: 8.3 10*3/uL (ref 4.0–10.5)

## 2015-09-16 LAB — RAPID URINE DRUG SCREEN, HOSP PERFORMED
AMPHETAMINES: NOT DETECTED
BARBITURATES: NOT DETECTED
Benzodiazepines: NOT DETECTED
Cocaine: NOT DETECTED
OPIATES: NOT DETECTED
TETRAHYDROCANNABINOL: POSITIVE — AB

## 2015-09-16 LAB — COMPREHENSIVE METABOLIC PANEL
ALK PHOS: 60 U/L (ref 38–126)
ALT: 18 U/L (ref 14–54)
AST: 23 U/L (ref 15–41)
Albumin: 3.9 g/dL (ref 3.5–5.0)
Anion gap: 6 (ref 5–15)
BUN: 9 mg/dL (ref 6–20)
CALCIUM: 9.5 mg/dL (ref 8.9–10.3)
CO2: 27 mmol/L (ref 22–32)
CREATININE: 0.83 mg/dL (ref 0.44–1.00)
Chloride: 105 mmol/L (ref 101–111)
Glucose, Bld: 96 mg/dL (ref 65–99)
Potassium: 3.5 mmol/L (ref 3.5–5.1)
Sodium: 138 mmol/L (ref 135–145)
Total Bilirubin: 0.2 mg/dL — ABNORMAL LOW (ref 0.3–1.2)
Total Protein: 7.2 g/dL (ref 6.5–8.1)

## 2015-09-16 LAB — TSH: TSH: 0.988 u[IU]/mL (ref 0.350–4.500)

## 2015-09-16 LAB — SALICYLATE LEVEL

## 2015-09-16 LAB — I-STAT CHEM 8, ED
BUN: 9 mg/dL (ref 6–20)
CALCIUM ION: 1.19 mmol/L (ref 1.13–1.30)
Chloride: 102 mmol/L (ref 101–111)
Creatinine, Ser: 0.8 mg/dL (ref 0.44–1.00)
Glucose, Bld: 89 mg/dL (ref 65–99)
HEMATOCRIT: 31 % — AB (ref 36.0–46.0)
HEMOGLOBIN: 10.5 g/dL — AB (ref 12.0–15.0)
Potassium: 3.5 mmol/L (ref 3.5–5.1)
SODIUM: 140 mmol/L (ref 135–145)
TCO2: 26 mmol/L (ref 0–100)

## 2015-09-16 LAB — URINALYSIS, ROUTINE W REFLEX MICROSCOPIC
BILIRUBIN URINE: NEGATIVE
Glucose, UA: NEGATIVE mg/dL
Hgb urine dipstick: NEGATIVE
KETONES UR: NEGATIVE mg/dL
NITRITE: NEGATIVE
PH: 7 (ref 5.0–8.0)
PROTEIN: NEGATIVE mg/dL
Specific Gravity, Urine: 1.018 (ref 1.005–1.030)

## 2015-09-16 LAB — ETHANOL: Alcohol, Ethyl (B): <5 mg/dL (ref <5)

## 2015-09-16 LAB — URINE MICROSCOPIC-ADD ON

## 2015-09-16 LAB — ACETAMINOPHEN LEVEL: Acetaminophen (Tylenol), Serum: <10 ug/mL — ABNORMAL LOW (ref 10–30)

## 2015-09-16 LAB — MAGNESIUM: Magnesium: 2.1 mg/dL (ref 1.7–2.4)

## 2015-09-16 LAB — MRSA PCR SCREENING: MRSA BY PCR: NEGATIVE

## 2015-09-16 MED ORDER — SODIUM CHLORIDE 0.9 % IV SOLN: 1000.0000 mg | Freq: Two times a day (BID) | INTRAVENOUS | Status: DC

## 2015-09-16 MED ORDER — SODIUM CHLORIDE 0.9 % IV SOLN
INTRAVENOUS | Status: AC
  Administered 2015-09-16: 05:00:00 via INTRAVENOUS

## 2015-09-16 MED ORDER — SODIUM CHLORIDE 0.9 % IV SOLN: 15.0000 mg/kg | Freq: Two times a day (BID) | INTRAVENOUS | Status: DC

## 2015-09-16 MED ORDER — ONDANSETRON HCL 4 MG/2ML IJ SOLN: 4.0000 mg | Freq: Four times a day (QID) | INTRAMUSCULAR | Status: DC | PRN

## 2015-09-16 MED ORDER — ONDANSETRON HCL 4 MG PO TABS: 4.0000 mg | ORAL_TABLET | Freq: Four times a day (QID) | ORAL | Status: DC | PRN

## 2015-09-16 MED ORDER — SODIUM CHLORIDE 0.9 % IV SOLN
10.0000 mg/kg | Freq: Two times a day (BID) | INTRAVENOUS | Status: DC
  Filled 2015-09-16 (×2): qty 0.71

## 2015-09-16 MED ORDER — SODIUM CHLORIDE 0.9 % IV SOLN
50.0000 mg | Freq: Four times a day (QID) | INTRAVENOUS | Status: DC
  Administered 2015-09-16 – 2015-09-17 (×5): 50 mg via INTRAVENOUS
  Filled 2015-09-16 (×11): qty 10

## 2015-09-16 NOTE — Consult Note (Signed)
[facility] Consult   Reason for Consult:  Intentional overdose of antifreeze as a suicide attempt Referring Physician:  Dr. Charlies Silvers Patient Identification: Lynn Gonzales MRN:  [id number] Principal Diagnosis: Antifreeze poisoning Diagnosis:   Patient Active Problem List   Diagnosis Date Noted  . Antifreeze poisoning [T51.8X1A] 09/16/2015  . Suicidal ideation [R45.851] 09/16/2015  . IUD contraception [Z97.5] 02/21/2015    Total Time spent with patient: 1 hour  Subjective:   Lynn Gonzales is a 20 y.o. female patient admitted with suicide attempt and intentional overdose of antifreeze.  HPI:  Lynn Gonzales is a 20 y.o. female, seen, chart reviewed for the face-to-face psychiatric consultation and evaluation of status post intentional ingestion of antifreeze from her car with intent to end her life. Patient endorses being depressed and want to end her life about 2 hours before she acted on those thoughts. Patient reported she has been fighting with depression for a long time and currently participating summer classes and research at [employer]. Patient stated she is being feeling more depressed, isolated, withdrawn, disturbed sleep and appetite, feels counselor was not supportive and saying she has been doing everything she showed to cope with her depression. She felt her parents has been saying that patient knows how to deal with her episodes of depression because he had previous episodes of depression. Patient urine drug screen is positive for tetrahydrocannabinol. Patient seems to be noncompliant with her ADHD medication because no urine drug screen positive for amphetamines. Patient Has Been Diagnosed with ADHD presently not on medications and previous attempts of suicide in 2013. Patient reported she contacted her mother after intentional  ingestion and felt sleepy and feels regrets about drinking antifreeze and came to the emergency department for seeking help as mother  suggested.  On initial examination Patient is not in respiratory distress and labs did not show any metabolic acidosis or renal failure. UA is not showing any crystals. 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 Patient has cannabis abuse and history of suicide attempt and previous acute psychiatric hospitalization at [facility] in [place].   Risk to Self: Is patient at risk for suicide?: Yes Risk to Others:   Prior Inpatient Therapy:   Prior Outpatient Therapy:    Past Medical History:  Past Medical History:  Diagnosis Date  . Asthma   . Mental disorder     Past Surgical History:  Procedure Laterality Date  . ANTERIOR CRUCIATE LIGAMENT REPAIR Left 2015  . OVARIAN CYST REMOVAL Left 2011  . TONSILLECTOMY  2015   Family History:  Family History  Problem Relation Age of Onset  . Diabetes Neg Hx   . Hypertension Neg Hx    Family Psychiatric  History: Patient denied family history of mental illness including depression Social History:  History  Alcohol Use  . 0.0 oz/week    Comment: Socially     History  Drug Use No    Social History   Social History  . Marital status: Single    Spouse name: N/A  . Number of children: N/A  . Years of education: N/A   Social History Main Topics  . Smoking status: Never Smoker  . Smokeless tobacco: Never Used  . Alcohol use 0.0  oz/week     Comment: Socially  . Drug use: No  . Sexual activity: Yes    Partners: Male    Birth control/ protection: Condom   Other Topics Concern  . None   Social History Narrative  . None   Additional Social History:    Allergies:  No Known Allergies  Labs:  Results  for orders placed or performed during the hospital encounter of 09/15/15 (from the past 48 hour(s))  Rapid urine drug screen (hospital performed)     Status: Abnormal   Collection Time: 09/15/15 11:31 PM  Result Value Ref Range   Opiates NONE DETECTED NONE DETECTED   Cocaine NONE DETECTED NONE DETECTED   Benzodiazepines NONE DETECTED NONE DETECTED   Amphetamines NONE DETECTED NONE DETECTED   Tetrahydrocannabinol POSITIVE (A) NONE DETECTED   Barbiturates NONE DETECTED NONE DETECTED    Comment:        DRUG SCREEN FOR MEDICAL PURPOSES ONLY.  IF CONFIRMATION IS NEEDED FOR ANY PURPOSE, NOTIFY LAB WITHIN 5 DAYS.        LOWEST DETECTABLE LIMITS FOR URINE DRUG SCREEN Drug Class       Cutoff (ng/mL) Amphetamine      1000 Barbiturate      200 Benzodiazepine   308 Tricyclics       657 Opiates          300 Cocaine          300 THC              50   Urinalysis, Routine w reflex microscopic (not at [facility])     Status: Abnormal   Collection Time: 09/15/15 11:31 PM  Result Value Ref Range   Color, Urine YELLOW YELLOW   APPearance CLOUDY (A) CLEAR   Specific Gravity, Urine 1.018 1.005 - 1.030   pH 7.0 5.0 - 8.0   Glucose, UA NEGATIVE NEGATIVE mg/dL   Hgb urine dipstick NEGATIVE NEGATIVE   Bilirubin Urine NEGATIVE NEGATIVE   Ketones, ur NEGATIVE NEGATIVE mg/dL   Protein, ur NEGATIVE NEGATIVE mg/dL   Nitrite NEGATIVE NEGATIVE   Leukocytes, UA SMALL (A) NEGATIVE  Urine microscopic-add on     Status: Abnormal   Collection Time: 09/15/15 11:31 PM  Result Value Ref Range   Squamous Epithelial / LPF 0-5 (A) NONE SEEN   WBC, UA 6-30 0 - 5 WBC/hpf   RBC / HPF 0-5 0 - 5 RBC/hpf   Bacteria, UA RARE (A) NONE SEEN   Urine-Other MUCOUS PRESENT   Comprehensive metabolic panel     Status: Abnormal   Collection Time: 09/15/15 11:39 PM  Result Value Ref Range   Sodium 138 135 - 145 mmol/L   Potassium 3.5 3.5 - 5.1 mmol/L   Chloride 105 101 - 111 mmol/L   CO2 27 22 - 32 mmol/L   Glucose, Bld 96 65  - 99 mg/dL   BUN 9 6 - 20 mg/dL   Creatinine, Ser 0.83 0.44 - 1.00 mg/dL   Calcium 9.5 8.9 - 10.3 mg/dL   Total Protein 7.2 6.5 - 8.1 g/dL   Albumin 3.9 3.5 - 5.0 g/dL   AST 23 15 - 41 U/L   ALT 18 14 - 54 U/L   Alkaline Phosphatase 60 38 - 126 U/L   Total Bilirubin 0.2 (L) 0.3 - 1.2 mg/dL   GFR calc non Af Amer >60 >60 mL/min   GFR calc Af Amer >60 >60 mL/min    Comment: (NOTE) The eGFR has been calculated  using the CKD EPI equation. This calculation has not been validated in all clinical situations. eGFR's persistently <60 mL/min signify possible Chronic Kidney Disease.    Anion gap 6 5 - 15  Ethanol     Status: None   Collection Time: 09/15/15 11:39 PM  Result Value Ref Range   Alcohol, Ethyl (B) <5 <5 mg/dL    Comment:        LOWEST DETECTABLE LIMIT FOR SERUM ALCOHOL IS 5 mg/dL FOR MEDICAL PURPOSES ONLY   Salicylate level     Status: None   Collection Time: 09/15/15 11:39 PM  Result Value Ref Range   Salicylate Lvl <6.2 2.8 - 30.0 mg/dL  Acetaminophen level     Status: Abnormal   Collection Time: 09/15/15 11:39 PM  Result Value Ref Range   Acetaminophen (Tylenol), Serum <10 (L) 10 - 30 ug/mL    Comment:        THERAPEUTIC CONCENTRATIONS VARY SIGNIFICANTLY. A RANGE OF 10-30 ug/mL MAY BE AN EFFECTIVE CONCENTRATION FOR MANY PATIENTS. HOWEVER, SOME ARE BEST TREATED AT CONCENTRATIONS OUTSIDE THIS RANGE. ACETAMINOPHEN CONCENTRATIONS >150 ug/mL AT 4 HOURS AFTER INGESTION AND >50 ug/mL AT 12 HOURS AFTER INGESTION ARE OFTEN ASSOCIATED WITH TOXIC REACTIONS.   cbc     Status: Abnormal   Collection Time: 09/15/15 11:39 PM  Result Value Ref Range   WBC 8.3 4.0 - 10.5 K/uL   RBC 3.96 3.87 - 5.11 MIL/uL   Hemoglobin 11.0 (L) 12.0 - 15.0 g/dL   HCT 34.4 (L) 36.0 - 46.0 %   MCV 86.9 78.0 - 100.0 fL   MCH 27.8 26.0 - 34.0 pg   MCHC 32.0 30.0 - 36.0 g/dL   RDW 13.5 11.5 - 15.5 %   Platelets 335 150 - 400 K/uL  Magnesium     Status: None   Collection Time: 09/15/15  11:39 PM  Result Value Ref Range   Magnesium 2.1 1.7 - 2.4 mg/dL  POC Urine Pregnancy, ED (do NOT order at [facility])     Status: None   Collection Time: 09/15/15 11:46 PM  Result Value Ref Range   Preg Test, Ur NEGATIVE NEGATIVE    Comment:        THE SENSITIVITY OF THIS METHODOLOGY IS >24 mIU/mL   I-Stat arterial blood gas, ED     Status: Abnormal   Collection Time: 09/15/15 11:58 PM  Result Value Ref Range   pH, Arterial 7.397 7.350 - 7.450   pCO2 arterial 39.2 35.0 - 45.0 mmHg   pO2, Arterial 90.0 80.0 - 100.0 mmHg   Bicarbonate 24.1 (H) 20.0 - 24.0 mEq/L   TCO2 25 0 - 100 mmol/L   O2 Saturation 97.0 %   Acid-base deficit 1.0 0.0 - 2.0 mmol/L   Patient temperature 98.7 F    Collection site RADIAL, ALLEN'S TEST ACCEPTABLE    Drawn by RT    Sample type ARTERIAL   I-stat chem 8, ed     Status: Abnormal   Collection Time: 09/16/15 12:47 AM  Result Value Ref Range   Sodium 140 135 - 145 mmol/L   Potassium 3.5 3.5 - 5.1 mmol/L   Chloride 102 101 - 111 mmol/L   BUN 9 6 - 20 mg/dL   Creatinine, Ser 0.80 0.44 - 1.00 mg/dL   Glucose, Bld 89 65 - 99 mg/dL   Calcium, Ion 1.19 1.13 - 1.30 mmol/L   TCO2 26 0 - 100 mmol/L   Hemoglobin 10.5 (L) 12.0 - 15.0 g/dL   HCT  31.0 (L) 36.0 - 46.0 %  TSH     Status: None   Collection Time: 09/16/15  4:33 AM  Result Value Ref Range   TSH 0.988 0.350 - 4.500 uIU/mL  MRSA PCR Screening     Status: None   Collection Time: 09/16/15  6:22 AM  Result Value Ref Range   MRSA by PCR NEGATIVE NEGATIVE    Comment:        The GeneXpert MRSA Assay (FDA approved for NASAL specimens only), is one component of a comprehensive MRSA colonization surveillance program. It is not intended to diagnose MRSA infection nor to guide or monitor treatment for MRSA infections.     Current Facility-Administered Medications  Medication Dose Route Frequency Provider Last Rate Last Dose  . 0.9 %  sodium chloride infusion   Intravenous Continuous Kristen N Ward, DO    Stopped at 09/16/15 0409  . 0.9 %  sodium chloride infusion   Intravenous STAT Kristen N Ward, DO 75 mL/hr at 09/16/15 1200    . folic acid 50 mg in sodium chloride 0.9 % 50 mL IVPB  50 mg Intravenous Q6H Kristen N Ward, DO   50 mg at 09/16/15 1020  . ondansetron (ZOFRAN) tablet 4 mg  4 mg Oral Q6H PRN Rise Patience, MD       Or  . ondansetron [facility]) injection 4 mg  4 mg Intravenous Q6H PRN Rise Patience, MD      . thiamine (B-1) injection 100 mg  100 mg Intravenous Q6H Kristen N Ward, DO   100 mg at 09/16/15 6433    Musculoskeletal: Strength & Muscle Tone: within normal limits Gait & Station: normal Patient leans: N/A  Psychiatric Specialty Exam: Physical Exam as per history and physical   ROS: depression, tearful, stressed about school, isolated and feels like helpless and hopeless. Patient does not want disturb her twin sister who is currently pregnant. Reportedly has sedation after intentional ingestion of antifreeze from her car. Denied shortness of breath and chest pain  No Fever-chills, No Headache, No changes with Vision or hearing, reports vertigo No problems swallowing food or Liquids, No Chest pain, Cough or Shortness of Breath, No Abdominal pain, No Nausea or Vommitting, Bowel movements are regular, No Blood in stool or Urine, No dysuria, No new skin rashes or bruises, No new joints pains-aches,  No new weakness, tingling, numbness in any extremity, No recent weight gain or loss, No polyuria, polydypsia or polyphagia,   A full 10 point Review of Systems was done, except as stated above, all other Review of Systems were negative.    Blood pressure (!) 98/41, pulse 74, temperature 98.2 F (36.8 C), temperature source Oral, resp. rate 16, height _0  (1.626 m), weight 70.2 kg (154 lb 12.2 oz), last menstrual period 09/08/2015, SpO2 94 %.Body mass index is 26.57 kg/m.  General Appearance: Guarded  Eye Contact:  Good  Speech:  Clear and Coherent   Volume:  Normal  Mood:  Depressed, Hopeless and Worthless  Affect:  Depressed and Tearful  Thought Process:  Coherent and Goal Directed  Orientation:  Full (Time, Place, and Person)  Thought Content:  Rumination  Suicidal Thoughts:  Yes.  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 Patient reported she has a history of major depressive disorder, recurrent and has seasonal affective disorder. She also has depression associated with her periods. Patient has been seen by a psychiatric services at [facility] including counseling services which are not adequate at this time.  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  Tetrahydrocannabinol abuse: Patient urine drug screen is positive for tetrahydrocannabinol and patient needed substance abuse counseling services  Psychosocial stresses: Patient was not able to identify due to this evaluation except feeling her consult is doing only  10 minutes time and not exploring her psychosocial stressors and coping mechanisms and asking for somebody who can invest in her care.  Appreciate psychiatric consultation and follow up as clinically required Please contact [phone] or [phone] if needs further assistance  Disposition: Contact the unit social service when medically cleared for availability of acute inpatient psychiatric bed Recommend psychiatric Inpatient admission when medically cleared. Supportive therapy provided about ongoing stressors.  Ambrose Finland, MD 09/16/2015 12:43 PM

## 2015-09-16 NOTE — Progress Notes (Signed)
CH responded to consult for suicidal thoughts. Pt was willing to engage in conversation. I feel this was more of an initial meeting and did not sense a willingness to go past surface topics at this point. I provided the ministry of presence and listening.  CH is available for follow up as needed.  Lorne Skeens Karsynn Deweese    09/16/15 1400  Clinical Encounter Type  Visited With Patient  Visit Type Initial;Spiritual support;Social support;Behavioral Health  Referral From Nurse  Spiritual Encounters  Spiritual Needs Emotional

## 2015-09-16 NOTE — Progress Notes (Addendum)
1300 Received pt from 2H15 via wheelchair, A&O x3, calm and pleasant, cooperative w/ 1:1 suicide sitter. Suicide precaution maint.

## 2015-09-16 NOTE — H&P (Signed)
History and Physical    Lynn Gonzales QJJ:[id number] DOB: [birth year] DOA: 09/15/2015  PCP: Pcp Not In System  Patient coming from: Home.  Chief Complaint: Intentional consumption of antifreeze.  HPI: Lynn Gonzales is a 20 y.o. female with ADHD presently not on medications and previous attempts of suicide in 2013 presents to the ER stating that she had intentionally consumed antifreeze last evening around 7 PM. Patient was depressed and wants to commit suicide. Denies taking any other medications. Patient is not in respiratory distress and labs did not show any metabolic acidosis or renal failure. UA is not showing any crystals. Poison control was contacted and patient's blood work was sent for ethylene glycol and methanol and poison control has advised to keep patient on Fomepizole, folic acid and thiamine until alcohol levels are back.   ED Course: See history of present illness.  Review of Systems: As per HPI, rest all negative.   Past Medical History:  Diagnosis Date  . Asthma   . Mental disorder     Past Surgical History:  Procedure Laterality Date  . ANTERIOR CRUCIATE LIGAMENT REPAIR Left 2015  . OVARIAN CYST REMOVAL Left 2011  . TONSILLECTOMY  2015     reports that she has never smoked. She has never used smokeless tobacco. She reports that she drinks alcohol. She reports that she does not use drugs.  No Known Allergies  Family History  Problem Relation Age of Onset  . Diabetes Neg Hx   . Hypertension Neg Hx     Prior to Admission medications   Medication Sig Start Date End Date Taking? Authorizing Provider  amphetamine-dextroamphetamine (ADDERALL XR) 15 MG 24 hr capsule Take 15 mg by mouth every morning.   Yes Historical Provider, MD  doxycycline (VIBRAMYCIN) 50 MG capsule Take 1 capsule (50 mg total) by mouth 2 (two) times daily. Patient not taking: Reported on 09/15/2015 07/18/15   Roe Coombs, CNM  Fluoxetine HCl, PMDD, (SARAFEM) 20 MG TABS Take 1 tablet  (20 mg total) by mouth daily. For the 2 weeks around your period Patient not taking: Reported on 01/15/2015 01/01/15   Roe Coombs, CNM    Physical Exam: Vitals:   09/16/15 0245 09/16/15 0300 09/16/15 0315 09/16/15 0330  BP: 109/60 101/59 (!) 102/54 110/59  Pulse: 66 (!) 57 (!) 54 (!) 55  Resp: 14 12 18 15   Temp:      SpO2: 99% 97% 97% 100%  Weight:      Height:          Constitutional: Not in distress. Vitals:   09/16/15 0245 09/16/15 0300 09/16/15 0315 09/16/15 0330  BP: 109/60 101/59 (!) 102/54 110/59  Pulse: 66 (!) 57 (!) 54 (!) 55  Resp: 14 12 18 15   Temp:      SpO2: 99% 97% 97% 100%  Weight:      Height:       Eyes: Anicteric no pallor. ENMT: No discharge from the ears eyes nose and mouth. Neck: No mass felt. No JVD appreciated. Respiratory: No rhonchi or crepitations. Cardiovascular: S1 and S2 heard. Abdomen: Soft nontender bowel sounds present. Musculoskeletal: No edema. Skin: No rash. Neurologic: Alert awake oriented to time place and person. Moves all extremities. Psychiatric: Appears normal.   Labs on Admission: I have personally reviewed following labs and imaging studies  CBC:  Recent Labs Lab 09/15/15 2339 09/16/15 0047  WBC 8.3  --   HGB 11.0* 10.5*  HCT 34.4* 31.0*  MCV 86.9  --  PLT 335  --    Basic Metabolic Panel:  Recent Labs Lab 09/15/15 2339 09/16/15 0047  NA 138 140  K 3.5 3.5  CL 105 102  CO2 27  --   GLUCOSE 96 89  BUN 9 9  CREATININE 0.83 0.80  CALCIUM 9.5  --   MG 2.1  --    GFR: Estimated Creatinine Clearance: 108.2 mL/min (by C-G formula based on SCr of 0.8 mg/dL). Liver Function Tests:  Recent Labs Lab 09/15/15 2339  AST 23  ALT 18  ALKPHOS 60  BILITOT 0.2*  PROT 7.2  ALBUMIN 3.9   No results for input(s): LIPASE, AMYLASE in the last 168 hours. No results for input(s): AMMONIA in the last 168 hours. Coagulation Profile: No results for input(s): INR, PROTIME in the last 168 hours. Cardiac  Enzymes: No results for input(s): CKTOTAL, CKMB, CKMBINDEX, TROPONINI in the last 168 hours. BNP (last 3 results) No results for input(s): PROBNP in the last 8760 hours. HbA1C: No results for input(s): HGBA1C in the last 72 hours. CBG: No results for input(s): GLUCAP in the last 168 hours. Lipid Profile: No results for input(s): CHOL, HDL, LDLCALC, TRIG, CHOLHDL, LDLDIRECT in the last 72 hours. Thyroid Function Tests: No results for input(s): TSH, T4TOTAL, FREET4, T3FREE, THYROIDAB in the last 72 hours. Anemia Panel: No results for input(s): VITAMINB12, FOLATE, FERRITIN, TIBC, IRON, RETICCTPCT in the last 72 hours. Urine analysis:    Component Value Date/Time   COLORURINE YELLOW 09/15/2015 2331   APPEARANCEUR CLOUDY (A) 09/15/2015 2331   LABSPEC 1.018 09/15/2015 2331   PHURINE 7.0 09/15/2015 2331   GLUCOSEU NEGATIVE 09/15/2015 2331   HGBUR NEGATIVE 09/15/2015 2331   BILIRUBINUR NEGATIVE 09/15/2015 2331   KETONESUR NEGATIVE 09/15/2015 2331   PROTEINUR NEGATIVE 09/15/2015 2331   NITRITE NEGATIVE 09/15/2015 2331   LEUKOCYTESUR SMALL (A) 09/15/2015 2331   Sepsis Labs: (procalcitonin:4,lacticidven:4) )No results found for this or any previous visit (from the past 240 hour(s)).   Radiological Exams on Admission: No results found.  EKG: Independently reviewed. Sinus bradycardia with beats around 57 bpm.  Assessment/Plan Principal Problem:   Antifreeze poisoning Active Problems:   Suicidal ideation    1. Intentional consumption of antifreeze with suicidal ideation - patient is placed on suicide precautions with sitter. At this time as per the recommendation of poison control Ethylene Glycol and methanol levels have been sent and until the levels are available patient will be on Fomepizole, which will be dosed per pharmacy and I have discussed with pharmacy along with folate and thiamine. I had also discussed with pulmonary critical care. At this time patient is not in  distress. Closely follow metabolic panel for any renal failure or worsening metabolic acidosis. 2. Depression - consult psychiatrist in a.m. 3. Anemia, normocytic normochromic - follow CBC and if there is no further worsening further workup as outpatient.   DVT prophylaxis: SCDs. Code Status: Full code.  Family Communication: No family at the bedside.  Disposition Plan: Home.  Consults called: Discussed with poison control and pulmonary critical care.  Admission status: Observation. Stepdown.    Eduard Clos MD [facility] Pager [phone].  If 7PM-7AM, please contact night-coverage [email or web address] Password [email or web address]  09/16/2015, 4:05 AM

## 2015-09-16 NOTE — Progress Notes (Signed)
Pt seen and examined at the bedside. Admission for suicidal ideations and attempt with overdose Psych consulted Transfer to medical floor  Manson Passey [facility] [phone]

## 2015-09-16 NOTE — ED Notes (Signed)
Spoke with Angelique Blonder from poison control for follow up.

## 2015-09-16 NOTE — Progress Notes (Signed)
MEDICATION RELATED CONSULT NOTE - INITIAL   Pharmacy Consult for Fomepizole Indication: etyhylene glycol poisoning  No Known Allergies  Patient Measurements: Height: 5\' 4"  (162.6 cm) Weight: 156 lb (70.8 kg) IBW/kg (Calculated) : 54.7  Vital Signs: Temp: 98.6 F (37 C) (07/31 2253) BP: 112/66 (08/01 0400) Pulse Rate: 57 (08/01 0400) Intake/Output from previous day: No intake/output data recorded. Intake/Output from this shift: No intake/output data recorded.  Labs:  Recent Labs  09/15/15 2339 09/16/15 0047  WBC 8.3  --   HGB 11.0* 10.5*  HCT 34.4* 31.0*  PLT 335  --   CREATININE 0.83 0.80  MG 2.1  --   ALBUMIN 3.9  --   PROT 7.2  --   AST 23  --   ALT 18  --   ALKPHOS 60  --   BILITOT 0.2*  --    Estimated Creatinine Clearance: 108.2 mL/min (by C-G formula based on SCr of 0.8 mg/dL).   Microbiology: No results found for this or any previous visit (from the past 720 hour(s)).  Medical History: Past Medical History:  Diagnosis Date  . Asthma   . Mental disorder     Medications:  See electronic med rec  Assessment: 20 y.o. F presents after trying to commit suicide by ingesting 8 oz ethlyene glycol. Pt received fomepizole loading dose 1000mg  (~15mg /kg) in ED ~0120. Pharmacy consulted to continue fomepizole dosing. Pt also on high dose folic acid and thiamine. Current labs wnl. Ethylene glycol level pending.  Poison Control Center called by ED MD and all recommendations currently being followed.  Goal of Therapy:  Ethylene glycol level < 20 mcg/dL  Normal blood pH  Plan:  - F/u pending ethylene glycol level - Fomepizole (710) 10mg /kg IV q12h x 4 doses then 1000mg  (15mg /kg) q12h until ethylene glycol level < 20 mg/dL - Will call [facility] for further recommendations  Christoper Fabian, PharmD, BCPS Clinical pharmacist, pager [phone] 09/16/2015,4:09 AM

## 2015-09-17 ENCOUNTER — Inpatient Hospital Stay (HOSPITAL_COMMUNITY)
Admission: AD | Admit: 2015-09-17 | Discharge: 2015-09-20 | DRG: 885 | Disposition: A | Discharge status: Home / Self Care | Payer: PRIVATE HEALTH INSURANCE | Source: Intra-hospital | Attending: Psychiatry | Admitting: Psychiatry

## 2015-09-17 ENCOUNTER — Encounter (HOSPITAL_COMMUNITY): Payer: Self-pay | Admitting: *Deleted

## 2015-09-17 DIAGNOSIS — F322 Major depressive disorder, single episode, severe without psychotic features: Secondary | ICD-10-CM | POA: Diagnosis present

## 2015-09-17 DIAGNOSIS — T518X3D Toxic effect of other alcohols, assault, subsequent encounter: Secondary | ICD-10-CM | POA: Diagnosis not present

## 2015-09-17 DIAGNOSIS — R45851 Suicidal ideations: Secondary | ICD-10-CM | POA: Diagnosis present

## 2015-09-17 DIAGNOSIS — T6592XA Toxic effect of unspecified substance, intentional self-harm, initial encounter: Secondary | ICD-10-CM | POA: Diagnosis not present

## 2015-09-17 DIAGNOSIS — T1491 Suicide attempt: Secondary | ICD-10-CM | POA: Diagnosis not present

## 2015-09-17 DIAGNOSIS — Z915 Personal history of self-harm: Secondary | ICD-10-CM | POA: Diagnosis not present

## 2015-09-17 DIAGNOSIS — F329 Major depressive disorder, single episode, unspecified: Secondary | ICD-10-CM | POA: Diagnosis present

## 2015-09-17 LAB — VOLATILES,BLD-ACETONE,ETHANOL,ISOPROP,METHANOL

## 2015-09-17 LAB — ETHYLENE GLYCOL

## 2015-09-17 MED ORDER — SERTRALINE HCL 20 MG/ML PO CONC
25.0000 mg | Freq: Every day | ORAL | Status: DC
Start: 2015-09-18 — End: 2015-09-18
  Filled 2015-09-17: qty 1.25

## 2015-09-17 MED ORDER — TRAZODONE HCL 50 MG PO TABS
50.0000 mg | ORAL_TABLET | Freq: Every day | ORAL | Status: DC
  Filled 2015-09-17 (×5): qty 1

## 2015-09-17 MED ORDER — MAGNESIUM HYDROXIDE 400 MG/5ML PO SUSP: 30.0000 mL | Freq: Every day | ORAL | Status: DC | PRN

## 2015-09-17 MED ORDER — ACETAMINOPHEN 325 MG PO TABS: 650.0000 mg | ORAL_TABLET | Freq: Four times a day (QID) | ORAL | Status: DC | PRN

## 2015-09-17 MED ORDER — ALUM & MAG HYDROXIDE-SIMETH 200-200-20 MG/5ML PO SUSP: 30.0000 mL | ORAL | Status: DC | PRN

## 2015-09-17 MED ORDER — HYDROXYZINE HCL 25 MG PO TABS: 25.0000 mg | ORAL_TABLET | Freq: Four times a day (QID) | ORAL | Status: DC | PRN

## 2015-09-17 NOTE — Tx Team (Signed)
Initial Interdisciplinary Treatment Plan   PATIENT STRESSORS: Educational concerns Occupational concerns   PATIENT STRENGTHS: Active sense of humor Average or above average intelligence Communication skills Financial means Motivation for treatment/growth Physical Health Supportive family/friends   PROBLEM LIST: Problem List/Patient Goals Date to be addressed Date deferred Reason deferred Estimated date of resolution  At risk for suicide 09/17/2015  09/17/2015   D/C  Depression 09/17/2015  09/17/2015   D/C  "Finding a better counselor to talk to" 09/17/2015  09/17/2015   D/C  Better utilizing my coping skills" 09/17/2015  09/17/2015   D/C                                 DISCHARGE CRITERIA:  Adequate post-discharge living arrangements Improved stabilization in mood, thinking, and/or behavior Motivation to continue treatment in a less acute level of care Need for constant or close observation no longer present Reduction of life-threatening or endangering symptoms to within safe limits Verbal commitment to aftercare and medication compliance  PRELIMINARY DISCHARGE PLAN: Outpatient therapy Return to previous living arrangement Return to previous work or school arrangements  PATIENT/FAMIILY INVOLVEMENT: This treatment plan has been presented to and reviewed with the patient, Lynn Gonzales.  The patient and family have been given the opportunity to ask questions and make suggestions.  Larry Sierras P 09/17/2015, 10:41 PM

## 2015-09-17 NOTE — Progress Notes (Signed)
Admission Note:  20 yr old female who presents voluntary in no acute distress for the treatment of SI and Depression.  Patient reports "I mixed antifreeze with fruit juice and drank it" with the intent to die.  Patient appears sad and depressed. Patient was calm and cooperative with admission process. Patient presents with passive SI and contracts for safety upon admission. Patient reports SI thoughts "come and go". Patient denies AVH. Patient reports main stressor as school.  Patient states "I don't know if i'm going to graduate, if I'm transferring, or changing my major".  Patient reports that it is currently finals week and she has to decide what type of research to complete for school.  Patient reports additional stressor of her twin sister having a baby.  Patient states "I have to grow up and be there for my sister. I'm not even sure she is ready to have this baby herself".  Patient reports that she has been working and going to school all summer and states that she has not been sleeping "I go to work 10:00pm-7am and then go to school 9:00am-6:45pm, 5 days a week".  Patient reports that she has been depressed since the 6th grade.  Skin was assessed and found to be clear of any abnormal marks.  Patient has multiple tattoos.  Patient searched and no contraband found, POC and unit policies explained and understanding verbalized. Consents obtained. Food and fluids offered and accepted. Patient had no additional questions or concerns.

## 2015-09-17 NOTE — Clinical Social Work Note (Signed)
CSW met with patient and asked patient's parents to step out of room. Patient was willing to go to [facility]. However, after patient spoke with her parents she informed CSW she no longer wanted to go to [facility] because she did not know what the facility was like. Patient then requested to speak with house RN. House RN met with CSW, patient, and patient's family. Patient reported she was willing to go an inpatient psychiatric facility, but would like a different facility. House RN called and spoke with [place] with [facility] AC Otila Kluver reported she would accept patient to [facility] to room 400 Bed II at 8:00PM. CSW met with patient patient signed Voluntary Admission and Consent for Treatment form. CSW called [facility] transportation.

## 2015-09-17 NOTE — Discharge Summary (Signed)
Physician Discharge Summary  Lynn Gonzales GGY:[id number] DOB: 1995/10/13 DOA: 09/15/2015  PCP: Pcp Not In System  Admit date: 09/15/2015 Discharge date: 09/17/2015  Recommendations for Outpatient Follow-up:  Continue adderall on discharge   Discharge Diagnoses:  Principal Problem:   Antifreeze poisoning Active Problems:   Suicidal ideation    Discharge Condition: stable   Diet recommendation: as tolerated   History of present illness:  Per HPI 09/16/2015  "20 y.o. female with ADHD presently not on medications and previous attempts of suicide in 2013 presents to the ER stating that she had intentionally consumed antifreeze last evening around 7 PM. Patient was depressed and wants to commit suicide. Denies taking any other medications. Patient is not in respiratory distress and labs did not show any metabolic acidosis or renal failure. UA is not showing any crystals. Poison control was contacted and patient's blood work was sent for ethylene glycol and methanol and poison control has advised to keep patient on Fomepizole, folic acid and thiamine until alcohol levels are back."  Hospital Course:   Suicidal attempt by overdose on antifreeze  / suicidal ideation / major depression  - Patient placed on suicide precaution - Sitter at bedside - Patient started on fomepizole - Poison control has been contacted with recommendations to continue to follow metabolic panel for any renal failure - Electrolytes and creatinine all within normal limits so fomepizole stopped 09/16/2015 - Patient seen by psychiatry in consultation and recommendation for inpatient psychiatric treatment - Patient is medically stable for discharge today   Signed:  Manson Passey, MD  [facility] 09/17/2015, 10:00 AM  Pager #: [phone]  Time spent in minutes: less than 30 minutes    Discharge Exam: Vitals:   09/16/15 2150 09/17/15 0541  BP: (!) 108/39 (!) 104/56  Pulse: 73 (!) 51  Resp: 17 16   Temp: 98.6 F (37 C) 98.1 F (36.7 C)   Vitals:   09/16/15 1303 09/16/15 1453 09/16/15 2150 09/17/15 0541  BP: (!) 104/55 119/64 (!) 108/39 (!) 104/56  Pulse: (!) 58 71 73 (!) 51  Resp: 16 16 17 16   Temp: 98.2 F (36.8 C) 97.8 F (36.6 C) 98.6 F (37 C) 98.1 F (36.7 C)  TempSrc: Oral Axillary Oral Oral  SpO2: 99% 100% 100% 100%  Weight:      Height:        General: Pt is alert, follows commands appropriately, not in acute distress Cardiovascular: Regular rate and rhythm, S1/S2 +, no murmurs Respiratory: Clear to auscultation bilaterally, no wheezing, no crackles, no rhonchi Abdominal: Soft, non tender, non distended, bowel sounds +, no guarding Extremities: no edema, no cyanosis, pulses palpable bilaterally DP and PT Neuro: Grossly nonfocal  Discharge Instructions  Discharge Instructions    Call MD for:  difficulty breathing, headache or visual disturbances    Complete by:  As directed   Call MD for:  persistant dizziness or light-headedness    Complete by:  As directed   Call MD for:  persistant nausea and vomiting    Complete by:  As directed   Call MD for:  severe uncontrolled pain    Complete by:  As directed   Diet - low sodium heart healthy    Complete by:  As directed   Increase activity slowly    Complete by:  As directed       Medication List    STOP taking these medications   doxycycline 50 MG capsule Commonly known as:  VIBRAMYCIN  Fluoxetine HCl (PMDD) 20 MG Tabs Commonly known as:  SARAFEM     TAKE these medications   amphetamine-dextroamphetamine 15 MG 24 hr capsule Commonly known as:  ADDERALL XR Take 15 mg by mouth every morning.         The results of significant diagnostics from this hospitalization (including imaging, microbiology, ancillary and laboratory) are listed below for reference.    Significant Diagnostic Studies: No results found.  Microbiology: Recent Results (from the past 240 hour(s))  MRSA PCR Screening     Status:  None   Collection Time: 09/16/15  6:22 AM  Result Value Ref Range Status   MRSA by PCR NEGATIVE NEGATIVE Final    Comment:        The GeneXpert MRSA Assay (FDA approved for NASAL specimens only), is one component of a comprehensive MRSA colonization surveillance program. It is not intended to diagnose MRSA infection nor to guide or monitor treatment for MRSA infections.      Labs: Basic Metabolic Panel:  Recent Labs Lab 09/15/15 2339 09/16/15 0047  NA 138 140  K 3.5 3.5  CL 105 102  CO2 27  --   GLUCOSE 96 89  BUN 9 9  CREATININE 0.83 0.80  CALCIUM 9.5  --   MG 2.1  --    Liver Function Tests:  Recent Labs Lab 09/15/15 2339  AST 23  ALT 18  ALKPHOS 60  BILITOT 0.2*  PROT 7.2  ALBUMIN 3.9   No results for input(s): LIPASE, AMYLASE in the last 168 hours. No results for input(s): AMMONIA in the last 168 hours. CBC:  Recent Labs Lab 09/15/15 2339 09/16/15 0047  WBC 8.3  --   HGB 11.0* 10.5*  HCT 34.4* 31.0*  MCV 86.9  --   PLT 335  --    Cardiac Enzymes: No results for input(s): CKTOTAL, CKMB, CKMBINDEX, TROPONINI in the last 168 hours. BNP: BNP (last 3 results) No results for input(s): BNP in the last 8760 hours.  ProBNP (last 3 results) No results for input(s): PROBNP in the last 8760 hours.  CBG: No results for input(s): GLUCAP in the last 168 hours.

## 2015-09-17 NOTE — Progress Notes (Signed)
Parents at bedside frustrated about MD (psyche)not giving update with the patient. This Clinical research associate stated that according to the MD note that patient is a candidate for acute inpatient psyche bed. Mother is not ina greement with the plan. SW was at the unit and stated a bed was available at [facility]. SW talked with the patient and initially agreed to the plan but the family refuse and wanting to go somewhere else. House Supervisor made aware of the problem .House supervisor will assist with the placement. Will continue to monitor.

## 2015-09-17 NOTE — Discharge Instructions (Signed)
Suicidal Feelings: How to Help Yourself °Suicide is the taking of one's own life. If you feel as though life is getting too tough to handle and are thinking about suicide, get help right away. To get help: °· Call your local emergency services (911 in the [place] °· Call a suicide hotline to speak with a trained counselor who understands how you are feeling. The following is a list of suicide hotlines in the [place]. For a list of hotlines in [place], visit www.suicide.org/hotlines/international/[place]-suicide-hotlines.html. °¨  1-800-273-TALK (1-800-273-8255). °¨  1-800-SUICIDE (1-800-784-2433). °¨  1-888-628-9454. This is a hotline for Spanish speakers. °¨  1-800-799-4TTY (1-800-799-4889). This is a hotline for TTY users. °¨  1-866-4-U-TREVOR (1-866-488-7386). This is a hotline for lesbian, gay, bisexual, transgender, or questioning youth. °· Contact a crisis center or a local suicide prevention center. To find a crisis center or suicide prevention center: °¨ Call your local hospital, clinic, community service organization, mental health center, social service provider, or health department. Ask for assistance in connecting to a crisis center. °¨ Visit www.suicidepreventionlifeline.org/getinvolved/locator for a list of crisis centers in the [place], or visit www.suicideprevention.[place]/thinking-about-suicide/find-a-crisis-centre for a list of centers in [place]. °· Visit the following websites: °¨  National Suicide Prevention Lifeline: www.suicidepreventionlifeline.org °¨  Hopeline: www.hopeline.com °¨  American Foundation for Suicide Prevention: www.afsp.org °¨  The Trevor Project (for lesbian, gay, bisexual, transgender, or questioning youth): www.thetrevorproject.org °HOW CAN I HELP MYSELF FEEL BETTER? °· Promise yourself that you will not do anything drastic when you have suicidal feelings. Remember, there is hope. Many people have gotten through suicidal thoughts and feelings, and you will, too. You may  have gotten through them before, and this proves that you can get through them again. °· Let family, friends, teachers, or counselors know how you are feeling. Try not to isolate yourself from those who care about you. Remember, they will want to help you. Talk with someone every day, even if you do not feel sociable. Face-to-face conversation is best. °· Call a mental health professional and see one regularly. °· Visit your primary health care provider every year. °· Eat a well-balanced diet, and space your meals so you eat regularly. °· Get plenty of rest. °· Avoid alcohol and drugs, and remove them from your home. They will only make you feel worse. °· If you are thinking of taking a lot of medicine, give your medicine to someone who can give it to you one day at a time. If you are on antidepressants and are concerned you will overdose, let your health care provider know so he or she can give you safer medicines. Ask your mental health professional about the possible side effects of any medicines you are taking. °· Remove weapons, poisons, knives, and anything else that could harm you from your home. °· Try to stick to routines. Follow a schedule every day. Put self-care on your schedule. °· Make a list of realistic goals, and cross them off when you achieve them. Accomplishments give a sense of worth. °· Wait until you are feeling better before doing the things you find difficult or unpleasant. °· Exercise if you are able. You will feel better if you exercise for even a half hour each day. °· Go out in the sun or into nature. This will help you recover from depression faster. If you have a favorite place to walk, go there. °· Do the things that have always given you pleasure. Play your favorite music, read a good book, paint a picture, play your favorite instrument, or do anything   else that takes your mind off your depression if it is safe to do. °· Keep your living space well lit. °· When you are feeling well,  write yourself a letter about tips and support that you can read when you are not feeling well. °· Remember that life's difficulties can be sorted out with help. Conditions can be treated. You can work on thoughts and strategies that serve you well. °  °This information is not intended to replace advice given to you by your health care provider. Make sure you discuss any questions you have with your health care provider. °  °Document Released: 08/08/2002 Document Revised: 02/22/2014 Document Reviewed: 05/29/2013 °Elsevier Interactive Patient Education ©2016 [employer] ° °

## 2015-09-17 NOTE — Progress Notes (Signed)
Report given to [facility], RN at inpatient [place] health.

## 2015-09-18 DIAGNOSIS — T1491 Suicide attempt: Secondary | ICD-10-CM

## 2015-09-18 DIAGNOSIS — T6592XA Toxic effect of unspecified substance, intentional self-harm, initial encounter: Secondary | ICD-10-CM

## 2015-09-18 MED ORDER — SERTRALINE HCL 50 MG PO TABS
50.0000 mg | ORAL_TABLET | Freq: Every day | ORAL | Status: DC
  Administered 2015-09-19 – 2015-09-20 (×2): 50 mg via ORAL
  Filled 2015-09-18 (×4): qty 1

## 2015-09-18 MED ORDER — SERTRALINE HCL 25 MG PO TABS
25.0000 mg | ORAL_TABLET | Freq: Every day | ORAL | Status: DC
  Administered 2015-09-18: 25 mg via ORAL
  Filled 2015-09-18 (×2): qty 1

## 2015-09-18 NOTE — Tx Team (Addendum)
Interdisciplinary Treatment Plan Update (Adult) Date: 09/18/2015    Time Reviewed: 9:30 AM  Progress in Treatment: Attending groups: Yes Participating in groups: Minimally Taking medication as prescribed: Yes Tolerating medication: Yes Family/Significant other contact made: Yes, CSW spoke with motherPatient understands diagnosis: Yes Discussing patient identified problems/goals with staff: Yes Medical problems stabilized or resolved: Yes Denies suicidal/homicidal ideation: Yes Issues/concerns per patient self-inventory: Yes Other:  New problem(s) identified: N/A  Discharge Plan or Barriers: Home with outpatient services  Reason for Continuation of Hospitalization:  Depression Anxiety Medication Stabilization   Comments: N/A  Estimated length of stay: Discharge anticipated for 09/20/15   Patient is a 20 year old female who presented to the hospital with depression and SI attempt by drinking antifreeze. Primary triggers for admission include employment, school, and family stressors. Patient will benefit from crisis stabilization, medication evaluation, group therapy and psycho education in addition to case management for discharge planning. At discharge, it is recommended that Pt remain compliant with established discharge plan and continued treatment.   Review of initial/current patient goals per problem list:  1. Goal(s): Patient will participate in aftercare plan   Met: Yes   Target date: 3-5 days post admission date   As evidenced by: Patient will participate within aftercare plan AEB aftercare provider and housing plan at discharge being identified.  8/3: Goal not met: CSW assessing for appropriate referrals for pt and will have follow up secured prior to d/c.  8/4: Goal met. Patient plans to return home to follow up with outpatient services.     2. Goal (s): Patient will exhibit decreased depressive symptoms and suicidal ideations.   Met: Adequate for discharge  per MD   Target date: 3-5 days post admission date   As evidenced by: Patient will utilize self rating of depression at 3 or below and demonstrate decreased signs of depression or be deemed stable for discharge by MD.  8/3: Goal not met: Pt presents with flat affect and depressed mood.  Pt admitted with high levels of depression. Pt to show decreased sign of depression and a rating of 3 or less before d/c.    8/4: Adequate for discharge per MD. Patient reports improvement in her symptoms, denies SI.    Attendees: Patient:    Family:    Physician: Dr. Cobos 09/18/2015 9:30 AM  Nursing: Patrice White, Roni Waller,RN 09/18/2015 9:30 AM  Clinical Social Worker:  , LCSW 09/18/2015 9:30 AM  Other: Heather Smart, LCSW  09/18/2015 9:30 AM  Other:    Other:    Other: Aggie Nwoko,NP 09/18/2015 9:30 AM  Other:     Scribe for Treatment Team:   , LCSW [phone]     

## 2015-09-18 NOTE — BHH Counselor (Signed)
Adult Comprehensive Assessment  Patient ID: Lynn Gonzales, female   DOB: [birth year], 20 y.o.   MRN: [id number]  Information Source: Information source: Patient  Current Stressors:  Educational / Learning stressors: In college at [employer], juggling school and work has been stressful.  Employment / Job issues: Quit job a few days ago, had been working at [employer] for 2 months  Family Relationships: Worried about twin sister who is pregnant Surveyor, quantity / Lack of resources (include bankruptcy): Denies Housing / Lack of housing: Lives in [place] in student apartment with a roommate Physical health (include injuries & life threatening diseases): Asthma Social relationships: Dance movement psychotherapist at school recently left Substance abuse: THC use Bereavement / Loss: Denies  Living/Environment/Situation:  Living Arrangements: Non-relatives/Friends Living conditions (as described by patient or guardian): Lives in [place] in student apartment with a roommate What is atmosphere in current home: Comfortable  Family History:  Marital status: Single Does patient have children?: No  Childhood History:  By whom was/is the patient raised?: Both parents Description of patient's relationship with caregiver when they were a child: Close with parents  Patient's description of current relationship with people who raised him/her: Close with parents  Does patient have siblings?: Yes Number of Siblings: 3 Description of patient's current relationship with siblings: Good relationship with sisters  Did patient suffer any verbal/emotional/physical/sexual abuse as a child?: No Did patient suffer from severe childhood neglect?: No Has patient ever been sexually abused/assaulted/raped as an adolescent or adult?: No Was the patient ever a victim of a crime or a disaster?: No Witnessed domestic violence?: No Has patient been effected by domestic violence as an adult?: No  Education:  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 [employer]  Currently a student?: Yes How long has the patient attended?: 1 yr  Learning disability?: Yes What learning problems does patient have?: Has been diagnosed with ADHD  Employment/Work Situation:   Employment situation: Unemployed What is the longest time patient has a held a job?: 3 years Where was the patient employed at that time?: [employer]  Has patient ever been in the [employer]?: No  Financial Resources:   Surveyor, quantity resources: Income from employment Does patient have a representative payee or guardian?: No  Alcohol/Substance Abuse:   What has been your use of drugs/alcohol within the last 12 months?: THC use If attempted suicide, did drugs/alcohol play a role in this?: No Alcohol/Substance Abuse Treatment Hx: Denies past history Has alcohol/substance abuse ever caused legal problems?: No  Social Support System:   Conservation officer, nature Support System: Good Describe Community Support System: Family support, friends  Type of faith/religion: Ephriam Knuckles How does patient's faith help to cope with current illness?: Helpful  Leisure/Recreation:   Leisure and Hobbies: cook, baking, watching netflix, playing lacross, shopping, spending time with friends   Strengths/Needs:   What things does the patient do well?: work well with others, communicate well, usually in a good mood, good problem solving skills In what areas does patient struggle / problems for patient: letting myself open up to others, chronic depression, discussing her depression with others, juggling school and work   Discharge Plan:   Does patient have access to transportation?: Yes Will patient be returning to same living situation after discharge?: Yes Currently receiving community mental health services: Yes (From Whom) ([facility]) If no, would patient like referral for services when discharged?:  ([place]) Does patient have financial barriers related to discharge  medications?: No  Summary/Recommendations:   Summary and  Recommendations (to be completed by the evaluator): Patient is a 20 year old female who presented to the hospital with depression and SI attempt by drinking antifreeze. Primary triggers for admission include employment, school, and family stressors. Patient will benefit from crisis stabilization, medication evaluation, group therapy and psycho education in addition to case management for discharge planning. At discharge, it is recommended that Pt remain compliant with established discharge plan and continued treatment.  Rainna Nearhood, [place] 09/18/2015

## 2015-09-18 NOTE — BHH Suicide Risk Assessment (Signed)
[facility] Admission Suicide Risk Assessment   Nursing information obtained from:  Patient Demographic factors:  Adolescent or young adult, Living alone, Unemployed Current Mental Status:  Suicidal ideation indicated by patient, Suicide plan, Plan includes specific time, place, or method, Self-harm thoughts, Self-harm behaviors, Intention to act on suicide plan, Belief that plan would result in death Loss Factors:  Decrease in vocational status, Loss of significant relationship Historical Factors:  Prior suicide attempts, NA Risk Reduction Factors:  Positive social support  Total Time spent with patient: 45 minutes Principal Problem: suicide attempt by poisoning  Diagnosis:   Patient Active Problem List   Diagnosis Date Noted  . MDD (major depressive disorder), single episode, severe (HCC) [F32.2] 09/17/2015  . Antifreeze poisoning [T51.8X1A] 09/16/2015  . Suicidal ideation [R45.851] 09/16/2015  . IUD contraception [Z97.5] 02/21/2015     Continued Clinical Symptoms:  Alcohol Use Disorder Identification Test Final Score (AUDIT): 2 The "Alcohol Use Disorders Identification Test", Guidelines for Use in Primary Care, Second Edition.  World Science writer [facility]). Score between 0-7:  no or low risk or alcohol related problems. Score between 8-15:  moderate risk of alcohol related problems. Score between 16-19:  high risk of alcohol related problems. Score 20 or above:  warrants further diagnostic evaluation for alcohol dependence and treatment.   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 Reports history of chronic, fluctuating depression and of ADHD. Impulsively attempted suicide by ingesting antifreeze- initially admitted to medical unit for medical clearance     Psychiatric Specialty Exam: Physical Exam  ROS  Blood pressure (!) 105/91, pulse  80, temperature 98.3 F (36.8 C), temperature source Oral, resp. rate 16, height 5\' 4"  (1.626 m), weight 155 lb (70.3 kg), last menstrual period 09/08/2015, SpO2 100 %.Body mass index is 26.61 kg/m.   see admit note MSE    COGNITIVE FEATURES THAT CONTRIBUTE TO RISK:  Closed-mindedness and Loss of executive function    SUICIDE RISK:   Moderate:  Frequent suicidal ideation with limited intensity, and duration, some specificity in terms of plans, no associated intent, good self-control, limited dysphoria/symptomatology, some risk factors present, and identifiable protective factors, including available and accessible social support.   PLAN OF CARE: Patient will be admitted to inpatient psychiatric unit for stabilization and safety. Will provide and encourage milieu participation. Provide medication management and maked adjustments as needed.  Will follow daily.    I certify that inpatient services furnished can reasonably be expected to improve the patient's condition.  Nehemiah Massed, MD 09/18/2015, 10:31 AM

## 2015-09-18 NOTE — Progress Notes (Signed)
D: Pt presents with flat affect and depressed mood. Pt appears to be minimizing her symptoms. Pt denies feeling depressed but reports depression for several days prior to admission. Pt initially refused Zoloft this morning stating that she had taken it in the past and it was not effective. Pt then brought writer a list of meds that she had taken in the past that were not effective. After speaking with the MD pt then agreed to taking Zoloft 25 mg.  A: Medications administered as ordered per MD. Meds reviewed with pt.Verbal support provided. Pt encouraged to attend groups. 15 minute checks performed for safety.  R: Pt receptive to tx.

## 2015-09-18 NOTE — H&P (Signed)
Psychiatric Admission Assessment Adult  Patient Identification: Lynn Gonzales MRN:  [id number] Date of Evaluation:  09/18/2015 Chief Complaint:   " I did something I regret " Principal Diagnosis: Suicide attempt  Diagnosis:   Patient Active Problem List   Diagnosis Date Noted  . MDD (major depressive disorder), single episode, severe (HCC) [F32.2] 09/17/2015  . Antifreeze poisoning [T51.8X1A] 09/16/2015  . Suicidal ideation [R45.851] 09/16/2015  . 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 She states her depression is chronic, fluctuates , but that generally she " has a handle on it". She states she had a " real bad day", and impulsively attempted suicide by ingesting about a half a cup of antifreeze , after which she regretted it and came to the hospital. Initially admitted to medical unit for medical clearance . She cannot establish any specific trigger or stressor , but states that she had been trying to juggle work and college ( currently taking Summer Courses ) , and had decided to quit her job a few days prior . She states she did feel frustrated because " even though I was doing the right things, studying, working, seeing a therapist, I still felt bad ". At this time she states she is feeling " better", although still " a little down"  Associated Signs/Symptoms: Depression Symptoms:  depressed mood, insomnia, suicidal attempt,  But denies changes in appetite or energy level  (Hypo) Manic Symptoms:  Denies  Anxiety Symptoms:  Denies agoraphobia, denies panic, denies social anxiety  Psychotic Symptoms:  Denies  PTSD Symptoms: Denies  Total Time spent with patient: 45 minutes  Past Psychiatric History:  One prior psychiatric admission for depression and overdose . No history of psychosis, no history of PTSD, no history of violence, denies history of panic or agoraphobia. History of  self cutting,  But not in several years. Reports recurrent depression, usually lasts a few weeks to months.  Denies any history of mania or hypomania, but states she was tried on Depakote briefly three years ago, because " they felt I was high energy". Has been diagnosed with ADHD , and states she did notice her grades improved when she took Adderall .   Is the patient at risk to self? Yes.    Has the patient been a risk to self in the past 6 months? Yes.    Has the patient been a risk to self within the distant past? Yes.    Is the patient a risk to others? No.  Has the patient been a risk to others in the past 6 months? No.  Has the patient been a risk to others within the distant past? No.   Prior Inpatient Therapy:  as above  Prior Outpatient Therapy:  sees school therapist twice a  Month   Alcohol Screening: 1. How often do you have a drink containing alcohol?: 2 to 4 times a month 2. How many drinks containing alcohol do you have on a typical day when you are drinking?: 1 or 2 3. How often do you have six or more drinks on one occasion?: Never Preliminary Score: 0 9. Have you or someone else been injured as a result of your drinking?: No 10. Has a relative or friend or a doctor or another health worker been concerned about your drinking or suggested you cut down?: No Alcohol Use Disorder Identification Test Final Score (AUDIT): 2 Brief Intervention: AUDIT score  less than 7 or less-screening does not suggest unhealthy drinking-brief intervention not indicated Substance Abuse History in the last 12 months: denies any drug or alcohol abuse Consequences of Substance Abuse: Denies  Previous Psychotropic Medications: has been on Adderall in the past, stopped two weeks ago, states that in 2014 tried different medications , including Lexapro, Prozac, Depakote, Abilify, Risperidone . Psychological Evaluations: No  Past Medical History:  Denies medical illnesses other than asthma Past  Medical History:  Diagnosis Date  . Asthma   . Mental disorder     Past Surgical History:  Procedure Laterality Date  . ANTERIOR CRUCIATE LIGAMENT REPAIR Left 2015  . OVARIAN CYST REMOVAL Left 2011  . TONSILLECTOMY  2015   Family History: parents alive, live together, in [place] , has three sisters .  Family History  Problem Relation Age of Onset  . Diabetes Neg Hx   . Hypertension Neg Hx    Family Psychiatric  History: non contributory  Tobacco Screening: Have you used any form of tobacco in the last 30 days? 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  History  Alcohol Use  . 0.0 oz/week    Comment: Socially     History  Drug Use No    Additional Social History:  Allergies:  No Known Allergies Lab Results: No results found for this or any previous visit (from the past 48 hour(s)).  Blood Alcohol level:  Lab Results  Component Value Date   ETH <5 09/15/2015    Metabolic Disorder Labs:  No results found for: HGBA1C, MPG No results found for: PROLACTIN No results found for: CHOL, TRIG, HDL, CHOLHDL, VLDL, LDLCALC  Current Medications: Current Facility-Administered Medications  Medication Dose Route Frequency Provider Last Rate Last Dose  . acetaminophen (TYLENOL) tablet 650 mg  650 mg Oral Q6H PRN Kerry Hough, PA-C      . alum & mag hydroxide-simeth (MAALOX/MYLANTA) 200-200-20 MG/5ML suspension 30 mL  30 mL Oral Q4H PRN Kerry Hough, PA-C      . hydrOXYzine (ATARAX/VISTARIL) tablet 25 mg  25 mg Oral Q6H PRN Kerry Hough, PA-C      . magnesium hydroxide (MILK OF MAGNESIA) suspension 30 mL  30 mL Oral Daily PRN Kerry Hough, PA-C      . sertraline (ZOLOFT)  tablet 25 mg  25 mg Oral Daily Kerry Hough, PA-C      . traZODone (DESYREL) tablet 50 mg  50 mg Oral QHS Kerry Hough, PA-C       PTA Medications: Prescriptions Prior to Admission  Medication Sig Dispense Refill Last Dose  . amphetamine-dextroamphetamine (ADDERALL XR) 15 MG 24 hr capsule Take 15 mg by mouth every morning.   Past Month at Unknown time    Musculoskeletal: Strength & Muscle Tone: within normal limits Gait & Station: normal Patient leans: N/A  Psychiatric Specialty Exam: Physical Exam  Review of Systems  Constitutional: Negative.   Eyes: Negative.   Respiratory: Negative.   Cardiovascular: Negative.   Gastrointestinal: Negative.   Genitourinary: Negative.   Skin: Negative.   Neurological: Positive for headaches. Negative for seizures.  Endo/Heme/Allergies: Negative.   Psychiatric/Behavioral: Positive for depression and suicidal ideas.  All other systems reviewed and are  negative.   Blood pressure (!) 105/91, pulse 80, temperature 98.3 F (36.8 C), temperature source Oral, resp. rate 16, height 5\' 4"  (1.626 m), weight 155 lb (70.3 kg), last menstrual period 09/08/2015, SpO2 100 %.Body mass index is 26.61 kg/m.  General Appearance: Well Groomed  Eye Contact:  Good  Speech:  Normal Rate  Volume:  Normal  Mood:  states she feels better, still depressed, but improved   Affect:  mildly constricted, but reactive   Thought Process:  Linear  Orientation:  Full (Time, Place, and Person)  Thought Content:  denies hallucinations, no delusions  Suicidal Thoughts:  No- denies any suicidal or self injurious ideations , contracts for safety on the unit   Homicidal Thoughts:  No- denies any homicidal ideations   Memory:  recent and remote grossly intact   Judgement:  Other:  improved   Insight:  fair   Psychomotor Activity:  Normal  Concentration:  Concentration: Good and Attention Span: Good  Recall:  Good  Fund of Knowledge:  Good  Language:  Good  Akathisia:   Negative  Handed:  Right  AIMS (if indicated):     Assets:  Desire for Improvement Resilience  ADL's:  Intact  Cognition:  WNL  Sleep:  Number of Hours: 6.25       Treatment Plan Summary: Daily contact with patient to assess and evaluate symptoms and progress in treatment, Medication management, Plan inpatient treatment and medications as below   Observation Level/Precautions:  15 minute checks  Laboratory:  as needed   Psychotherapy:  Milieu, supportive , groups   Medications:  We discussed options- at this time continue Zoloft antidepressant trial- will titrate to 50 mgrs daily   Consultations:  As needed   Discharge Concerns:  -   Estimated LOS: 5 days   Other:     I certify that inpatient services furnished can reasonably be expected to improve the patient's condition.    Nehemiah Massed, MD 8/3/20179:03 AM

## 2015-09-18 NOTE — BHH Group Notes (Signed)
BHH LCSW Group Therapy 09/18/2015 1:15 PM Type of Therapy: Group Therapy Participation Level: Active  Participation Quality: Attentive, Sharing and Supportive  Affect: Blunted  Cognitive: Alert and Oriented  Insight: Developing/Improving and Engaged  Engagement in Therapy: Developing/Improving and Engaged  Modes of Intervention: Activity, Clarification, Confrontation, Discussion, Education, Exploration, Limit-setting, Orientation, Problem-solving, Rapport Building, Reality Testing, Socialization and Support  Summary of Progress/Problems: Patient was attentive and engaged with speaker from [employer]. Patient was attentive to speaker while they shared their story of dealing with mental health and overcoming it. Patient expressed interest in their programs and services and received information on their agency. Patient processed ways they can relate to the speaker.   Cinque Begley, LCSW Clinical Social Worker [facility] [phone]   

## 2015-09-19 NOTE — BHH Group Notes (Signed)
   BHH LCSW Group Therapy 09/19/2015  1:15 pm   Type of Therapy: Group Therapy Participation Level: Minimal  Participation Quality: Attentive  Affect: Blunted  Cognitive: Alert and Oriented  Insight: Developing/Improving and Engaged  Engagement in Therapy: Developing/Improving and Engaged  Modes of Intervention: Clarification, Confrontation, Discussion, Education, Exploration, Limit-setting, Orientation, Problem-solving, Rapport Building, Dance movement psychotherapist, Socialization and Support  Summary of Progress/Problems: The topic for group was balance in life. Today's group focused on defining balance in one's own words, identifying things that can knock one off balance, and exploring healthy ways to maintain balance in life. Group members were asked to provide an example of a time when they felt off balance, describe how they handled that situation,and process healthier ways to regain balance in the future. Group members were asked to share the most important tool for maintaining balance that they learned while at [facility] and how they plan to apply this method after discharge. Patient participated minimally in discussion but identified academics as meaningful in her pursuit to become a physician.   Samuella Bruin, MSW, LCSW Clinical Social Worker [facility] [phone]

## 2015-09-19 NOTE — Progress Notes (Signed)
  [facility] Adult Case Management Discharge Plan :  Will you be returning to the same living situation after discharge:  Yes,  patient plans to return home At discharge, do you have transportation home?: Yes,  family Do you have the ability to pay for your medications: Yes,  patient will be provided with prescriptions at discharge  Release of information consent forms completed and in the chart;  Patient's signature needed at discharge.  Patient to Follow up at: Follow-up Information    Patient has declined referral for outpatient services at this time,she would prefer to schedule her own appointments. Provided with listing of local providers. .           Next level of care provider has access to [facility] Link:no  Safety Planning and Suicide Prevention discussed: Yes,  with patient and mother  Have you used any form of tobacco in the last 30 days? (Cigarettes, Smokeless Tobacco, Cigars, and/or Pipes): No  Has patient been referred to the Quitline?: Patient refused referral  Patient has been referred for addiction treatment: No, patient declined  Shaneece Stockburger, [place] 09/19/2015, 3:45 PM

## 2015-09-19 NOTE — [facility] Suicide Risk Assessment (Signed)
[facility] INPATIENT:  Family/Significant Other Suicide Prevention Education  Suicide Prevention Education:  Education Completed; mother Kimball Vander [phone],  (name of family member/significant other) has been identified by the patient as the family member/significant other with whom the patient will be residing, and identified as the person(s) who will aid the patient in the event of a mental health crisis (suicidal ideations/suicide attempt).  With written consent from the patient, the family member/significant other has been provided the following suicide prevention education, prior to the and/or following the discharge of the patient.  The suicide prevention education provided includes the following:  Suicide risk factors  Suicide prevention and interventions  National Suicide Hotline telephone number  [facility] assessment telephone number  [facility] Emergency Assistance 911  [facility] and/or Residential Mobile Crisis Unit telephone number  Request made of family/significant other to:  Remove weapons (e.g., guns, rifles, knives), all items previously/currently identified as safety concern.    Remove drugs/medications (over-the-counter, prescriptions, illicit drugs), all items previously/currently identified as a safety concern.  The family member/significant other verbalizes understanding of the suicide prevention education information provided.  The family member/significant other agrees to remove the items of safety concern listed above.  Harinder Romas, [place] 09/19/2015, 3:41 PM

## 2015-09-19 NOTE — Progress Notes (Signed)
Recreation Therapy Notes  Date: 09/19/15 Time: 0930 Location: 300 Hall Group Room  Group Topic: Stress Management  Goal Area(s) Addresses:  Patient will verbalize importance of using healthy stress management.  Patient will identify positive emotions associated with healthy stress management.   Intervention: Stress Management  Activity :  Peaceful Waves Guided Imagery.  LRT introduced to the technique of guided imagery to the patients.  Patients were asked to follow along with LRT as a script was read to engage in the technique.  Education:  Stress Management, Discharge Planning.    Clinical Observations/Feedback: Pt did not attend group.    Dyanara Cozza, LRT/CTRS  

## 2015-09-19 NOTE — Progress Notes (Signed)
D:  Patient denied SI and HI, contracts for safety.  Denied A/V hallucinations.   A:  Medications administered per MD orders.  Emotional support and encouragement given patient. R:  Safety maintained with 15 minute checks.  

## 2015-09-19 NOTE — Plan of Care (Signed)
Problem: Education: Goal: Knowledge of the prescribed therapeutic regimen will improve Outcome: Progressing Nurse discussed depression/coping skills with patient.        

## 2015-09-19 NOTE — Progress Notes (Signed)
[facility] MD Progress Note  09/19/2015 1:00 PM Lynn Gonzales  MRN:  [id number] Subjective:  Patient states she feels " all right " today. States she had a somewhat difficult family visit with her mother last evening- states mother supportive but at the same time confronting her recent behaviors and unhappy about finding out that patient had alcohol in her apartment . Of note, patient denies alcohol abuse . Denies medication side effects thus far. Denies any physical /neurological symptoms or pain following antifreeze  Ingestion- no GI or GU  symptoms,  no hematuria,fully alert, attentive, no confusion. Objective : I have met with patient and have discussed case with treatment team . Patient presents alert, attentive, fully oriented x 3. Reports feeling better, and at this time denies any suicidal ideations. She does continue to present vaguely depressed, although affect is reactive . She denies medication side effects.  Visible on unit, limited participation in groups,staff reports affect presents sad, flat at times .  We reviewed medications , patient states she has been on different antidepressant medications in the past , does not remember having had side effects, unsure if it was effective, but apparently took for only a short period of time. At patient's request and in her presence I spoke with her mother on the phone. Mother reported patient seemed improved, stable when she visited her yesterday, and reiterated her  support for patient and for ongoing outpatient therapy and treatment after discharge. Mother requested that staff inform her of patient's projected  Discharge date  with enough time, as she does not live close .  Patient is future oriented, hoping for discharge soon, planning on returning to classes .  Principal Problem: suicidal attempt  Diagnosis:   Patient Active Problem List   Diagnosis Date Noted  . MDD (major depressive disorder), single episode, severe ([place]) [F32.2] 09/17/2015   . Antifreeze poisoning [T51.8X1A] 09/16/2015  . Suicidal ideation [R45.851] 09/16/2015  . IUD contraception [Z97.5] 02/21/2015   Total Time spent with patient: 25 minutes    Past Medical History:  Past Medical History:  Diagnosis Date  . Asthma   . Mental disorder     Past Surgical History:  Procedure Laterality Date  . ANTERIOR CRUCIATE LIGAMENT REPAIR Left 2015  . OVARIAN CYST REMOVAL Left 2011  . TONSILLECTOMY  2015   Family History:  Family History  Problem Relation Age of Onset  . Diabetes Neg Hx   . Hypertension Neg Hx     Social History:  History  Alcohol Use  . 0.0 oz/week    Comment: Socially     History  Drug Use No    Social History   Social History  . Marital status: Single    Spouse name: N/A  . Number of children: N/A  . Years of education: N/A   Social History Main Topics  . Smoking status: Never Smoker  . Smokeless tobacco: Never Used  . Alcohol use 0.0 oz/week     Comment: Socially  . Drug use: No  . Sexual activity: Yes    Partners: Male    Birth control/ protection: Condom, IUD   Other Topics Concern  . None   Social History Narrative  . None   Additional Social History:   Sleep: improved   Appetite:  Good  Current Medications: Current Facility-Administered Medications  Medication Dose Route Frequency Provider Last Rate Last Dose  . acetaminophen (TYLENOL) tablet 650 mg  650 mg Oral Q6H PRN Laverle Hobby, PA-C      .  alum & mag hydroxide-simeth (MAALOX/MYLANTA) 200-200-20 MG/5ML suspension 30 mL  30 mL Oral Q4H PRN Laverle Hobby, PA-C      . hydrOXYzine (ATARAX/VISTARIL) tablet 25 mg  25 mg Oral Q6H PRN Laverle Hobby, PA-C      . magnesium hydroxide (MILK OF MAGNESIA) suspension 30 mL  30 mL Oral Daily PRN Laverle Hobby, PA-C      . sertraline (ZOLOFT) tablet 50 mg  50 mg Oral Daily Jenne Campus, MD   50 mg at 09/19/15 0809  . traZODone (DESYREL) tablet 50 mg  50 mg Oral QHS Laverle Hobby, PA-C        Lab  Results: No results found for this or any previous visit (from the past 48 hour(s)).  Blood Alcohol level:  Lab Results  Component Value Date   ETH <5 79/04/8331    Metabolic Disorder Labs: No results found for: HGBA1C, MPG No results found for: PROLACTIN No results found for: CHOL, TRIG, HDL, CHOLHDL, VLDL, LDLCALC  Physical Findings: AIMS: Facial and Oral Movements Muscles of Facial Expression: None, normal Lips and Perioral Area: None, normal Jaw: None, normal Tongue: None, normal,Extremity Movements Upper (arms, wrists, hands, fingers): None, normal Lower (legs, knees, ankles, toes): None, normal, Trunk Movements Neck, shoulders, hips: None, normal, Overall Severity Severity of abnormal movements (highest score from questions above): None, normal Incapacitation due to abnormal movements: None, normal Patient's awareness of abnormal movements (rate only patient's report): No Awareness, Dental Status Current problems with teeth and/or dentures?: No Does patient usually wear dentures?: No  CIWA:    COWS:     Musculoskeletal: Strength & Muscle Tone: within normal limits Gait & Station: normal Patient leans: N/A  Psychiatric Specialty Exam: Physical Exam  ROS no headache, no chest pain, no shortness of breath, no vomiting   Blood pressure (!) 106/50, pulse 73, temperature 98.4 F (36.9 C), temperature source Oral, resp. rate 18, height _0  (1.626 m), weight 155 lb (70.3 kg), last menstrual period 09/08/2015, SpO2 100 %.Body mass index is 26.61 kg/m.  General Appearance: Well Groomed  Eye Contact:  Good  Speech:  Normal Rate  Volume:  Normal  Mood:  reports mood is "OK", minimizes depression at this time  Affect:  mildly constricted, but reactive   Thought Process:  Linear  Orientation:  Full (Time, Place, and Person)  Thought Content:  denies hallucinations, no delusions   Suicidal Thoughts:  No at this time denies any suicidal ideations, denies any self injurious  ideations, contracts for safety   Homicidal Thoughts:  No denies any violent or homicidal ideations   Memory:  recent and remote grossly intact   Judgement:  Other:  improved   Insight:  Fair  Psychomotor Activity:  Normal  Concentration:  Concentration: Good and Attention Span: Good  Recall:  Good  Fund of Knowledge:  Good  Language:  Good  Akathisia:  Negative  Handed:  Right  AIMS (if indicated):     Assets:  Desire for Improvement Resilience  ADL's:  Intact  Cognition:  WNL  Sleep:  Number of Hours: 6.75   Assessment - patient reports feeling better,  And at this time minimizing depression , but affect does remain somewhat constricted, although she does smile at times during session . She denies any suicidal ideations or any physical or neurological symptoms following her recent ethylene glycol ingestion. She is tolerating Zoloft trial well thus far . At this time better able to process recent decompensation,  feels it was a combination of stressors, including managing work and education/college responsibilities, feeling upset about being depressed in spite of going to therapy and doing well in school, and states she has noticed a tendency towards increased depressive symptoms on the days prior to the onset of menses .    Treatment Plan Summary: Daily contact with patient to assess and evaluate symptoms and progress in treatment, Medication management, Plan inpatient treatment  and medications as below  Encourage ongoing group and milieu participation  Continue Zoloft 50 mgrs QDAY for depression, anxiety- side effects reviewed, including potential for increased suicidal ideations or activation early in treatment with antidepressants in young adults . Continue Vistaril 25 mgrs Q 6 hours PRN for anxiety, agitation Continue Trazodone 50 mgrs QHS for insomnia . Patient interested in continuing outpatient psychotherapy and psychiatric medication management after discharge from unit    Neita Garnet, MD 09/19/2015, 1:00 PM

## 2015-09-20 DIAGNOSIS — F322 Major depressive disorder, single episode, severe without psychotic features: Principal | ICD-10-CM

## 2015-09-20 MED ORDER — TRAZODONE HCL 50 MG PO TABS: 50.0000 mg | ORAL_TABLET | Freq: Every day | ORAL | 0 refills | Status: DC

## 2015-09-20 MED ORDER — SERTRALINE HCL 50 MG PO TABS: 50.0000 mg | ORAL_TABLET | Freq: Every day | ORAL | 0 refills | Status: DC

## 2015-09-20 MED ORDER — HYDROXYZINE HCL 25 MG PO TABS: 25.0000 mg | ORAL_TABLET | Freq: Four times a day (QID) | ORAL | 0 refills | Status: DC | PRN

## 2015-09-20 NOTE — Plan of Care (Signed)
Problem: Activity: Goal: Sleeping patterns will improve Outcome: Progressing PT has been sleeping without the aide of medication

## 2015-09-20 NOTE — Progress Notes (Signed)
D.  Pt pleasant on approach, denies complaints at this time.  Pt positive for evening wrap up group. Pt went to room after group to read.  Denies SI/HI/hallucinations at this time.  A.  Support and encouragement offered  R.  Pt remains safe on the unit, will continue to monitor.

## 2015-09-20 NOTE — Discharge Summary (Signed)
Physician Discharge Summary Note  Patient:  Lynn Gonzales is an 20 y.o., female MRN:  [id number] DOB:  09/24/1995 Patient phone:  [phone] (home)  Patient address:   [address] [facility] [address],  Total Time spent with patient: Greater than 30 minutes  Date of Admission:  09/17/2015 Date of Discharge: 09-20-15  Reason for Admission:  Suicide attempt by ingestion of antifreeze.  Principal Problem: Major depressive disorder Discharge Diagnoses: Patient Active Problem List   Diagnosis Date Noted  . MDD (major depressive disorder), single episode, severe ([facility]) [F32.2] 09/17/2015  . Antifreeze poisoning [T51.8X1A] 09/16/2015  . Suicidal ideation [R45.851] 09/16/2015  . IUD contraception [Z97.5] 02/21/2015   Past Psychiatric History: Major depression  Past Medical History:  Past Medical History:  Diagnosis Date  . Asthma   . Mental disorder     Past Surgical History:  Procedure Laterality Date  . ANTERIOR CRUCIATE LIGAMENT REPAIR Left 2015  . OVARIAN CYST REMOVAL Left 2011  . TONSILLECTOMY  2015   Family History:  Family History  Problem Relation Age of Onset  . Diabetes Neg Hx   . Hypertension Neg Hx    Family Psychiatric  History: See H&P  Social History:  History  Alcohol Use  . 0.0 oz/week    Comment: Socially     History  Drug Use No    Social History   Social History  . Marital status: Single    Spouse name: N/A  . Number of children: N/A  . Years of education: N/A   Social History Main Topics  . Smoking status: Never Smoker  . Smokeless tobacco: Never Used  . Alcohol use 0.0 oz/week     Comment: Socially  . Drug use: No  . Sexual activity: Yes    Partners: Male    Birth control/ protection: Condom, IUD   Other Topics Concern  . None   Social History Narrative  . 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 She states her depression is chronic, fluctuates , but that  generally she " has a handle on it". She states she had a " real bad day", and impulsively attempted suicide by ingesting about a half a cup of antifreeze , after which she regretted it and came to the hospital. Initially admitted to medical unit for medical clearance . She cannot establish any specific trigger or stressor , but states that she had been trying to juggle work and college ( currently taking Summer Courses ) , and had decided to quit her job a few days prior . She states she did feel frustrated because " even though I was doing the right things, studying, working, seeing a therapist, I still felt bad ". At this time she states she is feeling " better", although still " a little down"  Lynn Gonzales was admitted to the [facility] adult unit for mood stabilization treatments after a suicide attempt. She was experiencing worsening symptoms of depression triggered by what she described as "had a bad day", then, impulsively ingested a half of cup of an antifreeze. 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 She was in need of mood stabilization treatments.   After evaluation of her symptoms, Lynn Gonzales was started on medication regimen targeting her presenting symptoms (see MAR). She was also enrolled in the group milieu being offered & held on this unit. She was taught & leaned coping skills that should held her cope better after discharge.  She presented no other significant pre-existing medical issues that required treatment. Lynn Gonzales tolerated her treatment regimen without any adverse effects or reactions reported.  Lynn Gonzales's symptoms responded well to her treatment regimen. This is evidenced by her reports of improved mood, absence of AVH & SI. She is currently being discharged to continue further mental health care on an outpatient basis as noted below. She left [facility] with all belongings in no apparent distress. Transportation per family.  Physical Findings: AIMS: Facial and Oral Movements Muscles of  Facial Expression: None, normal Lips and Perioral Area: None, normal Jaw: None, normal Tongue: None, normal,Extremity Movements Upper (arms, wrists, hands, fingers): None, normal Lower (legs, knees, ankles, toes): None, normal, Trunk Movements Neck, shoulders, hips: None, normal, Overall Severity Severity of abnormal movements (highest score from questions above): None, normal Incapacitation due to abnormal movements: None, normal Patient's awareness of abnormal movements (rate only patient's report): No Awareness, Dental Status Current problems with teeth and/or dentures?: No Does patient usually wear dentures?: No  CIWA:  CIWA-Ar Total: 1 COWS:  COWS Total Score: 1  Musculoskeletal: Strength & Muscle Tone: within normal limits Gait & Station: normal Patient leans: N/A  Psychiatric Specialty Exam: Physical Exam  Constitutional: She appears well-developed.  HENT:  Head: Normocephalic.  Eyes: Pupils are equal, round, and reactive to light.  Neck: Normal range of motion.  Cardiovascular: Normal rate.   Respiratory: Effort normal.  GI: Soft.  Genitourinary:  Genitourinary Comments: Denies any issues in this area  Musculoskeletal: Normal range of motion.  Neurological: She is alert.  Skin: Skin is warm.    Review of Systems  Constitutional: Negative.   HENT: Negative.   Eyes: Negative.   Respiratory: Negative.   Cardiovascular: Negative.   Gastrointestinal: Negative.   Genitourinary: Negative.   Musculoskeletal: Negative.   Skin: Negative.   Neurological: Negative.   Endo/Heme/Allergies: Negative.   Psychiatric/Behavioral: Positive for depression (Stable). Negative for hallucinations, memory loss and suicidal ideas. The patient has insomnia (Stable). The patient is not nervous/anxious.     Blood pressure (!) 105/57, pulse 75, temperature 98.6 F (37 C), temperature source Oral, resp. rate 16, height 5\' 4"  (1.626 m), weight 70.3 kg (155 lb), last menstrual period  09/08/2015, SpO2 100 %.Body mass index is 26.61 kg/m.  See Md's SRA   Have you used any form of tobacco in the last 30 days? (Cigarettes, Smokeless Tobacco, Cigars, and/or Pipes): No  Has this patient used any form of tobacco in the last 30 days? (Cigarettes, Smokeless Tobacco, Cigars, and/or Pipes)No  Blood Alcohol level:  Lab Results  Component Value Date   ETH <5 09/15/2015    Metabolic Disorder Labs:  No results found for: HGBA1C, MPG No results found for: PROLACTIN No results found for: CHOL, TRIG, HDL, CHOLHDL, VLDL, LDLCALC  See Psychiatric Specialty Exam and Suicide Risk Assessment completed by Attending Physician prior to discharge.  Discharge destination:  Home  Is patient on multiple antipsychotic therapies at discharge:  No   Has Patient had three or more failed trials of antipsychotic monotherapy by history:  No  Recommended Plan for Multiple Antipsychotic Therapies: NA    Medication List    STOP taking these medications   amphetamine-dextroamphetamine 15 MG 24 hr capsule Commonly known as:  ADDERALL XR     TAKE these medications     Indication  hydrOXYzine 25 MG tablet Commonly known as:  ATARAX/VISTARIL Take 1 tablet (25 mg total) by mouth every 6 (six) hours as needed for anxiety.  Indication:  Anxiety Neurosis   sertraline 50 MG tablet Commonly known as:  ZOLOFT Take 1 tablet (50 mg total) by mouth daily. For depression  Indication:  Major Depressive Disorder   traZODone 50 MG tablet Commonly known as:  DESYREL Take 1 tablet (50 mg total) by mouth at bedtime. For insomnia  Indication:  Trouble Sleeping      Follow-up Information    Patient has declined referral for outpatient services at this time,she would prefer to schedule her own appointments. Provided with listing of local providers. .          Follow-up recommendations: Activity:  As tolerated Diet: As recommended by your primary care doctor. Keep all scheduled follow-up  appointments as recommended.   Comments: Patient is instructed prior to discharge to: Take all medications as prescribed by his/her mental healthcare provider. Report any adverse effects and or reactions from the medicines to his/her outpatient provider promptly. Patient has been instructed & cautioned: To not engage in alcohol and or illegal drug use while on prescription medicines. In the event of worsening symptoms, patient is instructed to call the crisis hotline, 911 and or go to the nearest ED for appropriate evaluation and treatment of symptoms. To follow-up with his/her primary care provider for your other medical issues, concerns and or health care needs.   Signed: Sanjuana Kava, NP, PMHNP, FNP 09/20/2015, 10:10 AM  I have examined the patient and agree with the discharge plan and findings. I have also done suicide assessment on this patient.

## 2015-09-20 NOTE — [facility] Group Notes (Signed)
[facility] Group Notes: (Clinical Social Work)   09/20/2015      Type of Therapy:  Group Therapy at 1:15pm  Participation Level:  Did Not Attend due to having been discharged   [facility], LCSW 09/20/2015, 4:08 PM

## 2015-09-20 NOTE — [facility] Suicide Risk Assessment (Signed)
[facility] Discharge Suicide Risk Assessment   Principal Problem: <principal problem not specified> Discharge Diagnoses:  Patient Active Problem List   Diagnosis Date Noted  . MDD (major depressive disorder), single episode, severe ([facility]) [F32.2] 09/17/2015  . Antifreeze poisoning [T51.8X1A] 09/16/2015  . Suicidal ideation [R45.851] 09/16/2015  . IUD contraception [Z97.5] 02/21/2015    Total Time spent with patient: 30 minutes  Musculoskeletal: Strength & Muscle Tone: within normal limits Gait & Station: normal Patient leans: no lean  Psychiatric Specialty Exam: Review of Systems  Constitutional: Negative for fever.  Cardiovascular: Negative for chest pain.  Skin: Negative for rash.  Psychiatric/Behavioral: Negative for depression and suicidal ideas.    Blood pressure (!) 105/57, pulse 75, temperature 98.6 F (37 C), temperature source Oral, resp. rate 16, height 5\' 4"  (1.626 m), weight 70.3 kg (155 lb), last menstrual period 09/08/2015, SpO2 100 %.Body mass index is 26.61 kg/m.  General Appearance: Casual  Eye Contact::  Fair  Speech:  Normal Rate409  Volume:  Normal  Mood:  Euthymic  Affect:  Congruent  Thought Process:  Goal Directed  Orientation:  Full (Time, Place, and Person)  Thought Content:  Rumination  Suicidal Thoughts:  No  Homicidal Thoughts:  No  Memory:  Immediate;   Fair Recent;   Good  Judgement:  Fair  Insight:  Fair  Psychomotor Activity:  Normal  Concentration:  Good  Recall:  Good  Fund of Knowledge:Good  Language: Good  Akathisia:  No  Handed:  Right  AIMS (if indicated):     Assets:  Desire for Improvement  Sleep:  Number of Hours: 6.5  Cognition: WNL  ADL's:  Intact   Mental Status Per Nursing Assessment::   On Admission:  Suicidal ideation indicated by patient, Suicide plan, Plan includes specific time, place, or method, Self-harm thoughts, Self-harm behaviors, Intention to act on suicide plan, Belief that plan would result in  death  Demographic Factors:  young female but has good support system.  Loss Factors: Loss of significant relationship  Historical Factors: Impulsivity  Risk Reduction Factors:   Positive therapeutic relationship and Positive coping skills or problem solving skills  Continued Clinical Symptoms:  Previous Psychiatric Diagnoses and Treatments  Cognitive Features That Contribute To Risk:  None    Suicide Risk:  Minimal: No identifiable suicidal ideation.  Patients presenting with no risk factors but with morbid ruminations; may be classified as minimal risk based on the severity of the depressive symptoms  Follow-up Information    Patient has declined referral for outpatient services at this time,she would prefer to schedule her own appointments. Provided with listing of local providers. .          Tolerating zoloft and future oriented. Wants to keep outpatient appointment and insight is improved See discharge summary for detail Plan Of Care/Follow-up recommendations:  Activity:  as tolerated Diet:  regular  Thresa [name], MD 09/20/2015, 10:48 AM

## 2015-09-20 NOTE — Progress Notes (Signed)
Patient ID: Lynn Gonzales, female   DOB: Jul 15, 1995, 20 y.o.   MRN: [id number]   Pt was discharged home with family. Pt reported that she was ready for discharge, and that she was feeling much better. Pt reported that her depression was a 2, her hopelessness was a 0, and her anxiety was a 3. Pt reported that she was negative SI/HI, no AH/VH noted. All discharge instructions were given to patient, no issues or concerns noted at time of discharge.

## 2015-10-23 ENCOUNTER — Encounter: Payer: Self-pay | Admitting: Obstetrics

## 2015-10-23 ENCOUNTER — Ambulatory Visit (INDEPENDENT_AMBULATORY_CARE_PROVIDER_SITE_OTHER): Payer: PRIVATE HEALTH INSURANCE | Admitting: Certified Nurse Midwife

## 2015-10-23 ENCOUNTER — Encounter: Payer: Self-pay | Admitting: *Deleted

## 2015-10-23 VITALS — BP 106/64 | HR 88 | Temp 100.3°F | Wt 152.6 lb

## 2015-10-23 DIAGNOSIS — N926 Irregular menstruation, unspecified: Secondary | ICD-10-CM

## 2015-10-23 DIAGNOSIS — N730 Acute parametritis and pelvic cellulitis: Secondary | ICD-10-CM | POA: Diagnosis not present

## 2015-10-23 DIAGNOSIS — Z3202 Encounter for pregnancy test, result negative: Secondary | ICD-10-CM | POA: Diagnosis not present

## 2015-10-23 DIAGNOSIS — R6889 Other general symptoms and signs: Secondary | ICD-10-CM | POA: Diagnosis not present

## 2015-10-23 LAB — POCT URINE PREGNANCY: Preg Test, Ur: NEGATIVE

## 2015-10-23 MED ORDER — CEFTRIAXONE SODIUM 1 G IJ SOLR
250.0000 mg | Freq: Once | INTRAMUSCULAR | Status: AC
  Administered 2015-10-23: 250 mg via INTRAMUSCULAR

## 2015-10-23 MED ORDER — DOXYCYCLINE HYCLATE 100 MG PO TABS: 100.0000 mg | ORAL_TABLET | Freq: Two times a day (BID) | ORAL | 0 refills | Status: DC

## 2015-10-23 MED ORDER — METRONIDAZOLE 500 MG PO TABS: 500.0000 mg | ORAL_TABLET | Freq: Two times a day (BID) | ORAL | 0 refills | Status: DC

## 2015-10-23 MED ORDER — IBUPROFEN 800 MG PO TABS: 800.0000 mg | ORAL_TABLET | Freq: Three times a day (TID) | ORAL | 1 refills | Status: DC | PRN

## 2015-10-23 NOTE — Progress Notes (Signed)
Patient ID: Lynn Gonzales, female   DOB: 20-Nov-1995, 20 y.o.   MRN: [id number]  Chief Complaint  Patient presents with  . Vaginal Bleeding    Pt states she is passing clots larger than golf ball size and fainting while on her menstrial cycle this month. She also complains of sore throat, and states she has had an increased temp since yesterday.    HPI Lynn Gonzales is a 20 y.o. female.  Has had a fever and sore throat for a few days.  Had recently been diagnosed with Chlamydia and took Azithromycin yesterday.  States that she had been happy with her IUD up till this month when she started having a lot of abdominal pain and golf ball sized clots.      HPI  Past Medical History:  Diagnosis Date  . Asthma   . Mental disorder     Past Surgical History:  Procedure Laterality Date  . ANTERIOR CRUCIATE LIGAMENT REPAIR Left 2015  . OVARIAN CYST REMOVAL Left 2011  . TONSILLECTOMY  2015    Family History  Problem Relation Age of Onset  . Diabetes Neg Hx   . Hypertension Neg Hx     Social History Social History  Substance Use Topics  . Smoking status: Never Smoker  . Smokeless tobacco: Never Used  . Alcohol use No     Comment: Socially    No Known Allergies  Current Outpatient Prescriptions  Medication Sig Dispense Refill  . doxycycline (VIBRA-TABS) 100 MG tablet Take 1 tablet (100 mg total) by mouth 2 (two) times daily. 28 tablet 0  . hydrOXYzine (ATARAX/VISTARIL) 25 MG tablet Take 1 tablet (25 mg total) by mouth every 6 (six) hours as needed for anxiety. (Patient not taking: Reported on 10/23/2015) 60 tablet 0  . ibuprofen (ADVIL,MOTRIN) 800 MG tablet Take 1 tablet (800 mg total) by mouth every 8 (eight) hours as needed. 60 tablet 1  . metroNIDAZOLE (FLAGYL) 500 MG tablet Take 1 tablet (500 mg total) by mouth 2 (two) times daily. 28 tablet 0  . sertraline (ZOLOFT) 50 MG tablet Take 1 tablet (50 mg total) by mouth daily. For depression (Patient not taking: Reported on 10/23/2015)  30 tablet 0  . traZODone (DESYREL) 50 MG tablet Take 1 tablet (50 mg total) by mouth at bedtime. For insomnia (Patient not taking: Reported on 10/23/2015) 30 tablet 0   No current facility-administered medications for this visit.     Review of Systems Review of Systems Constitutional: + for fatigue and negative for weight loss, + fever Respiratory: + for cough and negative for wheezing Cardiovascular: negative for chest pain, and palpitations Gastrointestinal: negative for abdominal pain and change in bowel habits Genitourinary:+ AUB with pain Integument/breast: negative for nipple discharge Musculoskeletal:+for myalgias Neurological: negative for gait problems and tremors Behavioral/Psych: negative for abusive relationship, depression Endocrine: negative for temperature intolerance     Blood pressure 106/64, pulse 88, temperature 100.3 F (37.9 C), weight 152 lb 9.6 oz (69.2 kg), last menstrual period 10/20/2015.  Physical Exam Physical Exam General:   alert  Skin:   no rash or abnormalities  Lungs:   clear to auscultation bilaterally  Heart:   regular rate and rhythm, S1, S2 normal, no murmur, click, rub or gallop  Breasts:   deferred  Abdomen:  normal findings: no organomegaly, soft, non-tender and no hernia  Pelvis:  External genitalia: normal general appearance Urinary system: urethral meatus normal and bladder without fullness, nontender Vaginal: normal without tenderness, induration or masses, +  vaginal bleeding with dark rubera Cervix: +CMT, IUD strings presen Adnexa: normal bimanual exam Uterus: anteverted and tender, normal size    50% of 25 min visit spent on counseling and coordination of care.   Data Reviewed Previous medical hx, meds, labs  Assessment     PID Probable Viral illness IUD check up    Plan    Orders Placed This Encounter  Procedures  . Influenza a and b  . US Transvaginal Non-OB    Standing Status:   Future    Standing Expiration Date:    12/22/2016    Order Specific Question:   Reason for Exam (SYMPTOM  OR DIAGNOSIS REQUIRED)    Answer:   acute PID    Order Specific Question:   Preferred imaging location?    Answer:   Internal  . US Pelvis Complete    Standing Status:   Future    Standing Expiration Date:   12/22/2016    Order Specific Question:   Reason for Exam (SYMPTOM  OR DIAGNOSIS REQUIRED)    Answer:   recent PID, has Skyla IUD    Order Specific Question:   Preferred imaging location?    Answer:   Internal  . CBC with Differential/Platelet  . POCT urine pregnancy   Meds ordered this encounter  Medications  . doxycycline (VIBRA-TABS) 100 MG tablet    Sig: Take 1 tablet (100 mg total) by mouth 2 (two) times daily.    Dispense:  28 tablet    Refill:  0  . metroNIDAZOLE (FLAGYL) 500 MG tablet    Sig: Take 1 tablet (500 mg total) by mouth 2 (two) times daily.    Dispense:  28 tablet    Refill:  0  . ibuprofen (ADVIL,MOTRIN) 800 MG tablet    Sig: Take 1 tablet (800 mg total) by mouth every 8 (eight) hours as needed.    Dispense:  60 tablet    Refill:  1  . cefTRIAXone (ROCEPHIN) injection 250 mg    Order Specific Question:   Antibiotic Indication:    Answer:   Other Indication (list below)    Order Specific Question:   Other Indication:    Answer:   PID     Possible management options include:IUD removal Follow up as needed.

## 2015-10-23 NOTE — Patient Instructions (Addendum)
Pt advised to call office tomorrow for Flu results. Pelvic Inflammatory Disease Pelvic inflammatory disease (PID) refers to an infection in some or all of the female organs. The infection can be in the uterus, ovaries, fallopian tubes, or the surrounding tissues in the pelvis. PID can cause abdominal or pelvic pain that comes on suddenly (acute pelvic pain). PID is a serious infection because it can lead to lasting (chronic) pelvic pain or the inability to have children (infertility). CAUSES This condition is most often caused by an infection that is spread during sexual contact. However, the infection can also be caused by the normal bacteria that are found in the vaginal tissues if these bacteria travel upward into the reproductive organs. PID can also occur following:  The birth of a baby.  A miscarriage.  An abortion.  Major pelvic surgery.  The use of an intrauterine device (IUD).  A sexual assault. RISK FACTORS This condition is more likely to develop in women who:  Are younger than 20 years of age.  [facility].  Use nonbarrier contraception.  Have multiple sexual partners.  Have sex with someone who has symptoms of an STD (sexually transmitted disease).  Use oral contraception. At times, certain behaviors can also increase the possibility of getting PID, such as:  Using a vaginal douche.  Having an IUD in place. SYMPTOMS Symptoms of this condition include:  Abdominal or pelvic pain.  Fever.  Chills.  Abnormal vaginal discharge.  Abnormal uterine bleeding.  Unusual pain shortly after the end of a menstrual period.  Painful urination.  Pain with sexual intercourse.  Nausea and vomiting. DIAGNOSIS To diagnose this condition, your health care provider will do a physical exam and take your medical history. A pelvic exam typically reveals great tenderness in the uterus and the surrounding pelvic tissues. You may also have tests, such  as:  Lab tests, including a pregnancy test, blood tests, and urine test.  Culture tests of the vagina and cervix to check for an STD.  Ultrasound.  A laparoscopic procedure to look inside the pelvis.  Examining vaginal secretions under a microscope. TREATMENT Treatment for this condition may involve one or more approaches.  Antibiotic medicines may be prescribed to be taken by mouth.  Sexual partners may need to be treated if the infection is caused by an STD.  For more severe cases, hospitalization may be needed to give antibiotics directly into a vein through an IV tube.  Surgery may be needed if other treatments do not help, but this is rare. It may take weeks until you are completely well. If you are diagnosed with PID, you should also be checked for human immunodeficiency virus (HIV). Your health care provider may test you for infection again 3 months after treatment. You should not have unprotected sex. HOME CARE INSTRUCTIONS  Take over-the-counter and prescription medicines only as told by your health care provider.  If you were prescribed an antibiotic medicine, take it as told by your health care provider. Do not stop taking the antibiotic even if you start to feel better.  Do not have sexual intercourse until treatment is completed or as told by your health care provider. If PID is confirmed, your recent sexual partners will need treatment, especially if you had unprotected sex.  Keep all follow-up visits as told by your health care provider. This is important. SEEK MEDICAL CARE IF:  You have increased or abnormal vaginal discharge.  Your pain does not improve.  You vomit.  You have a fever.  You cannot tolerate your medicines.  Your partner has an STD.  You have pain when you urinate. SEEK IMMEDIATE MEDICAL CARE IF:  You have increased abdominal or pelvic pain.  You have chills.  Your symptoms are not better in 72 hours even with treatment.   This  information is not intended to replace advice given to you by your health care provider. Make sure you discuss any questions you have with your health care provider.   Document Released: 02/01/2005 Document Revised: 10/23/2014 Document Reviewed: 03/11/2014 Elsevier Interactive Patient Education 2016 [employer]. Sexually Transmitted Disease A sexually transmitted disease (STD) is a disease or infection that may be passed (transmitted) from person to person, usually during sexual activity. This may happen by way of saliva, semen, blood, vaginal mucus, or urine. Common STDs include:  Gonorrhea.  Chlamydia.  Syphilis.  HIV and AIDS.  Genital herpes.  Hepatitis B and C.  Trichomonas.  Human papillomavirus (HPV).  Pubic lice.  Scabies.  Mites.  Bacterial vaginosis. WHAT ARE CAUSES OF STDs? An STD may be caused by bacteria, a virus, or parasites. STDs are often transmitted during sexual activity if one person is infected. However, they may also be transmitted through nonsexual means. STDs may be transmitted after:   Sexual intercourse with an infected person.  Sharing sex toys with an infected person.  Sharing needles with an infected person or using unclean piercing or tattoo needles.  Having intimate contact with the genitals, mouth, or rectal areas of an infected person.  Exposure to infected fluids during birth. WHAT ARE THE SIGNS AND SYMPTOMS OF STDs? Different STDs have different symptoms. Some people may not have any symptoms. If symptoms are present, they may include:  Painful or bloody urination.  Pain in the pelvis, abdomen, vagina, anus, throat, or eyes.  A skin rash, itching, or irritation.  Growths, ulcerations, blisters, or sores in the genital and anal areas.  Abnormal vaginal discharge with or without bad odor.  Penile discharge in men.  Fever.  Pain or bleeding during sexual intercourse.  Swollen glands in the groin area.  Yellow skin and  eyes (jaundice). This is seen with hepatitis.  Swollen testicles.  Infertility.  Sores and blisters in the mouth. HOW ARE STDs DIAGNOSED? To make a diagnosis, your health care provider may:  Take a medical history.  Perform a physical exam.  Take a sample of any discharge to examine.  Swab the throat, cervix, opening to the penis, rectum, or vagina for testing.  Test a sample of your first morning urine.  Perform blood tests.  Perform a Pap test, if this applies.  Perform a colposcopy.  Perform a laparoscopy. HOW ARE STDs TREATED? Treatment depends on the STD. Some STDs may be treated but not cured.  Chlamydia, gonorrhea, trichomonas, and syphilis can be cured with antibiotic medicine.  Genital herpes, hepatitis, and HIV can be treated, but not cured, with prescribed medicines. The medicines lessen symptoms.  Genital warts from HPV can be treated with medicine or by freezing, burning (electrocautery), or surgery. Warts may come back.  HPV cannot be cured with medicine or surgery. However, abnormal areas may be removed from the cervix, vagina, or vulva.  If your diagnosis is confirmed, your recent sexual partners need treatment. This is true even if they are symptom-free or have a negative culture or evaluation. They should not have sex until their health care providers say it is okay.  Your health  care provider may test you for infection again 3 months after treatment. HOW CAN I REDUCE MY RISK OF GETTING AN STD? Take these steps to reduce your risk of getting an STD:  Use latex condoms, dental dams, and water-soluble lubricants during sexual activity. Do not use petroleum jelly or oils.  Avoid having multiple sex partners.  Do not have sex with someone who has other sex partners  Do not have sex with anyone you do not know or who is at high risk for an STD.  Avoid risky sex practices that can break your skin.  Do not have sex if you have open sores on your mouth  or skin.  Avoid drinking too much alcohol or taking illegal drugs. Alcohol and drugs can affect your judgment and put you in a vulnerable position.  Avoid engaging in oral and anal sex acts.  Get vaccinated for HPV and hepatitis. If you have not received these vaccines in the past, talk to your health care provider about whether one or both might be right for you.  If you are at risk of being infected with HIV, it is recommended that you take a prescription medicine daily to prevent HIV infection. This is called pre-exposure prophylaxis (PrEP). You are considered at risk if:  You are a man who has sex with other men (MSM).  You are a heterosexual man or woman and are sexually active with more than one partner.  You take drugs by injection.  You are sexually active with a partner who has HIV.  Talk with your health care provider about whether you are at high risk of being infected with HIV. If you choose to begin PrEP, you should first be tested for HIV. You should then be tested every 3 months for as long as you are taking PrEP. WHAT SHOULD I DO IF I THINK I HAVE AN STD?  See your health care provider.  Tell your sexual partner(s). They should be tested and treated for any STDs.  Do not have sex until your health care provider says it is okay. WHEN SHOULD I GET IMMEDIATE MEDICAL CARE? Contact your health care provider right away if:   You have severe abdominal pain.  You are a man and notice swelling or pain in your testicles.  You are a woman and notice swelling or pain in your vagina.   This information is not intended to replace advice given to you by your health care provider. Make sure you discuss any questions you have with your health care provider.   Document Released: 04/24/2002 Document Revised: 02/22/2014 Document Reviewed: 08/22/2012 Elsevier Interactive Patient Education [employer]

## 2015-10-24 ENCOUNTER — Telehealth: Payer: Self-pay | Admitting: Pediatrics

## 2015-10-24 ENCOUNTER — Ambulatory Visit: Payer: PRIVATE HEALTH INSURANCE

## 2015-10-24 NOTE — Telephone Encounter (Signed)
Pt called and left message on nurse line in regards to flu results from yesterday.  The results are not in yet. Pt was advised and asked how to protect her sister from flu. I advised she wear mask and hand washing. She voiced understanding.

## 2015-10-25 LAB — CBC WITH DIFFERENTIAL/PLATELET
BASOS ABS: 0 10*3/uL (ref 0.0–0.2)
BASOS: 0 %
EOS (ABSOLUTE): 0.1 10*3/uL (ref 0.0–0.4)
Eos: 1 %
Hematocrit: 33.1 % — ABNORMAL LOW (ref 34.0–46.6)
Hemoglobin: 10.6 g/dL — ABNORMAL LOW (ref 11.1–15.9)
IMMATURE GRANULOCYTES: 0 %
Immature Grans (Abs): 0 10*3/uL (ref 0.0–0.1)
Lymphocytes Absolute: 1.1 10*3/uL (ref 0.7–3.1)
Lymphs: 14 %
MCH: 27.9 pg (ref 26.6–33.0)
MCHC: 32 g/dL (ref 31.5–35.7)
MCV: 87 fL (ref 79–97)
MONOS ABS: 0.8 10*3/uL (ref 0.1–0.9)
Monocytes: 10 %
NEUTROS ABS: 5.9 10*3/uL (ref 1.4–7.0)
NEUTROS PCT: 75 %
PLATELETS: 306 10*3/uL (ref 150–379)
RBC: 3.8 x10E6/uL (ref 3.77–5.28)
RDW: 14.7 % (ref 12.3–15.4)
WBC: 7.9 10*3/uL (ref 3.4–10.8)

## 2015-10-25 LAB — INFLUENZA A AND B
INFLUENZA A AG, EIA: POSITIVE — AB
Influenza B Ag, EIA: NEGATIVE

## 2015-10-27 ENCOUNTER — Other Ambulatory Visit: Payer: Self-pay | Admitting: Certified Nurse Midwife

## 2015-10-27 DIAGNOSIS — D509 Iron deficiency anemia, unspecified: Secondary | ICD-10-CM

## 2015-10-27 MED ORDER — NIFEREX PO TABS: 1.0000 | ORAL_TABLET | Freq: Two times a day (BID) | ORAL | 5 refills | Status: DC

## 2015-10-28 ENCOUNTER — Ambulatory Visit (HOSPITAL_COMMUNITY)
Admission: RE | Admit: 2015-10-28 | Discharge: 2015-10-28 | Disposition: A | Discharge status: Home / Self Care | Payer: BLUE CROSS/BLUE SHIELD | Source: Ambulatory Visit | Attending: Certified Nurse Midwife | Admitting: Certified Nurse Midwife

## 2015-10-28 DIAGNOSIS — N730 Acute parametritis and pelvic cellulitis: Secondary | ICD-10-CM | POA: Insufficient documentation

## 2015-10-28 DIAGNOSIS — Z975 Presence of (intrauterine) contraceptive device: Secondary | ICD-10-CM | POA: Insufficient documentation

## 2015-10-29 ENCOUNTER — Telehealth: Payer: Self-pay | Admitting: *Deleted

## 2015-10-29 NOTE — Telephone Encounter (Signed)
Patient aware needs to make appointment to remove and replace her Mirena.

## 2015-10-29 NOTE — Telephone Encounter (Signed)
Patient made aware that she did have the Flu but it is to far out to get Tamiflu.Patient states she is doing better anyway.

## 2015-11-03 ENCOUNTER — Ambulatory Visit (INDEPENDENT_AMBULATORY_CARE_PROVIDER_SITE_OTHER): Payer: PRIVATE HEALTH INSURANCE | Admitting: Obstetrics & Gynecology

## 2015-11-03 ENCOUNTER — Ambulatory Visit: Payer: Self-pay | Admitting: Obstetrics & Gynecology

## 2015-11-03 ENCOUNTER — Encounter: Payer: Self-pay | Admitting: *Deleted

## 2015-11-03 VITALS — BP 102/33 | HR 53 | Temp 98.4°F | Wt 150.0 lb

## 2015-11-03 DIAGNOSIS — Z30432 Encounter for removal of intrauterine contraceptive device: Secondary | ICD-10-CM | POA: Diagnosis not present

## 2015-11-03 DIAGNOSIS — Z30430 Encounter for insertion of intrauterine contraceptive device: Secondary | ICD-10-CM

## 2015-11-03 DIAGNOSIS — R102 Pelvic and perineal pain: Secondary | ICD-10-CM

## 2015-11-03 DIAGNOSIS — Z30014 Encounter for initial prescription of intrauterine contraceptive device: Secondary | ICD-10-CM

## 2015-11-03 MED ORDER — LEVONORGESTREL 20 MCG/24HR IU IUD
INTRAUTERINE_SYSTEM | Freq: Once | INTRAUTERINE | Status: AC
  Administered 2015-11-03: 11:00:00 via INTRAUTERINE

## 2015-11-03 NOTE — Patient Instructions (Signed)
Levonorgestrel intrauterine device (IUD) What is this medicine? LEVONORGESTREL IUD (LEE voe nor jes trel) is a contraceptive (birth control) device. The device is placed inside the uterus by a healthcare professional. It is used to prevent pregnancy and can also be used to treat heavy bleeding that occurs during your period. Depending on the device, it can be used for 3 to 5 years. This medicine may be used for other purposes; ask your health care provider or pharmacist if you have questions. What should I tell my health care provider before I take this medicine? They need to know if you have any of these conditions: -abnormal Pap smear -cancer of the breast, uterus, or cervix -diabetes -endometritis -genital or pelvic infection now or in the past -have more than one sexual partner or your partner has more than one partner -heart disease -history of an ectopic or tubal pregnancy -immune system problems -IUD in place -liver disease or tumor -problems with blood clots or take blood-thinners -use intravenous drugs -uterus of unusual shape -vaginal bleeding that has not been explained -an unusual or allergic reaction to levonorgestrel, other hormones, silicone, or polyethylene, medicines, foods, dyes, or preservatives -pregnant or trying to get pregnant -breast-feeding How should I use this medicine? This device is placed inside the uterus by a health care professional. Talk to your pediatrician regarding the use of this medicine in children. Special care may be needed. Overdosage: If you think you have taken too much of this medicine contact a poison control center or emergency room at once. NOTE: This medicine is only for you. Do not share this medicine with others. What if I miss a dose? This does not apply. What may interact with this medicine? Do not take this medicine with any of the following medications: -amprenavir -bosentan -fosamprenavir This medicine may also interact with  the following medications: -aprepitant -barbiturate medicines for inducing sleep or treating seizures -bexarotene -griseofulvin -medicines to treat seizures like carbamazepine, ethotoin, felbamate, oxcarbazepine, phenytoin, topiramate -modafinil -pioglitazone -rifabutin -rifampin -rifapentine -some medicines to treat HIV infection like atazanavir, indinavir, lopinavir, nelfinavir, tipranavir, ritonavir -St. John's wort -warfarin This list may not describe all possible interactions. Give your health care provider a list of all the medicines, herbs, non-prescription drugs, or dietary supplements you use. Also tell them if you smoke, drink alcohol, or use illegal drugs. Some items may interact with your medicine. What should I watch for while using this medicine? Visit your doctor or health care professional for regular check ups. See your doctor if you or your partner has sexual contact with others, becomes HIV positive, or gets a sexual transmitted disease. This product does not protect you against HIV infection (AIDS) or other sexually transmitted diseases. You can check the placement of the IUD yourself by reaching up to the top of your vagina with clean fingers to feel the threads. Do not pull on the threads. It is a good habit to check placement after each menstrual period. Call your doctor right away if you feel more of the IUD than just the threads or if you cannot feel the threads at all. The IUD may come out by itself. You may become pregnant if the device comes out. If you notice that the IUD has come out use a backup birth control method like condoms and call your health care provider. Using tampons will not change the position of the IUD and are okay to use during your period. What side effects may I notice from receiving this medicine?   Side effects that you should report to your doctor or health care professional as soon as possible: -allergic reactions like skin rash, itching or  hives, swelling of the face, lips, or tongue -fever, flu-like symptoms -genital sores -high blood pressure -no menstrual period for 6 weeks during use -pain, swelling, warmth in the leg -pelvic pain or tenderness -severe or sudden headache -signs of pregnancy -stomach cramping -sudden shortness of breath -trouble with balance, talking, or walking -unusual vaginal bleeding, discharge -yellowing of the eyes or skin Side effects that usually do not require medical attention (report to your doctor or health care professional if they continue or are bothersome): -acne -breast pain -change in sex drive or performance -changes in weight -cramping, dizziness, or faintness while the device is being inserted -headache -irregular menstrual bleeding within first 3 to 6 months of use -nausea This list may not describe all possible side effects. Call your doctor for medical advice about side effects. You may report side effects to FDA at 1-800-FDA-1088. Where should I keep my medicine? This does not apply. NOTE: This sheet is a summary. It may not cover all possible information. If you have questions about this medicine, talk to your doctor, pharmacist, or health care provider.    2016, Elsevier/Gold Standard. (2011-03-04 13:54:04) Intrauterine Device Insertion, Care After Refer to this sheet in the next few weeks. These instructions provide you with information on caring for yourself after your procedure. Your health care provider may also give you more specific instructions. Your treatment has been planned according to current medical practices, but problems sometimes occur. Call your health care provider if you have any problems or questions after your procedure. WHAT TO EXPECT AFTER THE PROCEDURE Insertion of the IUD may cause some discomfort, such as cramping. The cramping should improve after the IUD is in place. You may have bleeding after the procedure. This is normal. It varies from light  spotting for a few days to menstrual-like bleeding. When the IUD is in place, a string will extend past the cervix into the vagina for 1-2 inches. The strings should not bother you or your partner. If they do, talk to your health care provider.  HOME CARE INSTRUCTIONS   Check your intrauterine device (IUD) to make sure it is in place before you resume sexual activity. You should be able to feel the strings. If you cannot feel the strings, something may be wrong. The IUD may have fallen out of the uterus, or the uterus may have been punctured (perforated) during placement. Also, if the strings are getting longer, it may mean that the IUD is being forced out of the uterus. You no longer have full protection from pregnancy if any of these problems occur.  You may resume sexual intercourse if you are not having problems with the IUD. The copper IUD is considered immediately effective, and the hormone IUD works right away if inserted within 7 days of your period starting. You will need to use a backup method of birth control for 7 days if the IUD in inserted at any other time in your cycle.  Continue to check that the IUD is still in place by feeling for the strings after every menstrual period.  You may need to take pain medicine such as acetaminophen or ibuprofen. Only take medicines as directed by your health care provider. SEEK MEDICAL CARE IF:   You have bleeding that is heavier or lasts longer than a normal menstrual cycle.  You have a fever.    You have increasing cramps or abdominal pain not relieved with medicine.  You have abdominal pain that does not seem to be related to the same area of earlier cramping and pain.  You are lightheaded, unusually weak, or faint.  You have abnormal vaginal discharge or smells.  You have pain during sexual intercourse.  You cannot feel the IUD strings, or the IUD string has gotten longer.  You feel the IUD at the opening of the cervix in the  vagina.  You think you are pregnant, or you miss your menstrual period.  The IUD string is hurting your sex partner. MAKE SURE YOU:  Understand these instructions.  Will watch your condition.  Will get help right away if you are not doing well or get worse.   This information is not intended to replace advice given to you by your health care provider. Make sure you discuss any questions you have with your health care provider.   Document Released: 09/30/2010 Document Revised: 11/22/2012 Document Reviewed: 07/23/2012 Elsevier Interactive Patient Education 2016 Elsevier Inc.  

## 2015-11-03 NOTE — Progress Notes (Signed)
History:  20 y.o. G0P0000 here today for IUD removal and reinsertion. Pt was seen for pelvic pain and was thought to have PID. No cx were obtained. Pt is still on meds. A sono was done that revealed a misplaced IUD.   Pt had a 3 year IUD placed initially but, reports significant bleeding with that.  The following portions of the patient's history were reviewed and updated as appropriate: allergies, current medications, past family history, past medical history, past social history, past surgical history and problem list.  Review of Systems:  Pertinent items are noted in HPI.  Objective:  Physical Exam Blood pressure (!) 102/33, pulse (!) 53, temperature 98.4 F (36.9 C), weight 150 lb (68 kg), last menstrual period 10/30/2015. Gen: NAD Abd: Soft, nontender and nondistended   GYNECOLOGY CLINIC PROCEDURE NOTE Patient identified, informed consent performed.  Discussed risks of irregular bleeding, cramping, infection, malpositioning or misplacement of the IUD outside the uterus which may require further procedures. Time out was performed.  Patient was in the dorsal lithotomy position, normal external genitalia was noted.  A speculum was placed in the patient's vagina, normal discharge was noted, no lesions. The cervix was visualized, no lesions, no abnormal discharge;  and no CMT was noted.  The strings of the IUD were grasped and pulled using ring forceps.  The IUD was successfully removed in its entirety. Patient tolerated the procedure well.  At this point the cervix was cleaned with Betadine x 2.  Grasped anteriorly with a single tooth tenaculum.  Uterus sounded to 6 cm.  Mirena IUD placed per manufacturer's recommendations.  Strings trimmed to 3 cm. Tenaculum was removed, good hemostasis noted.  Patient tolerated procedure well.    Labs and Imaging US Transvaginal Non-ob  Result Date: 10/28/2015 CLINICAL DATA:  Acute pelvic inflammatory disease. IUD. Pelvic pain. EXAM: TRANSABDOMINAL AND  TRANSVAGINAL ULTRASOUND OF PELVIS TECHNIQUE: Both transabdominal and transvaginal ultrasound examinations of the pelvis were performed. Transabdominal technique was performed for global imaging of the pelvis including uterus, ovaries, adnexal regions, and pelvic cul-de-sac. It was necessary to proceed with endovaginal exam following the transabdominal exam to visualize the endometrium. COMPARISON:  None FINDINGS: Uterus Measurements: 7.1 x 2.8 x 4.9 cm. No fibroids or other mass visualized. Endometrium Thickness: Normal thickness, 6 mm. No focal abnormality visualized. IUD noted in the cervix and lower uterine segment region. Right ovary Measurements: 2.9 x 2.4 x 1.7 cm. Normal appearance/no adnexal mass. Left ovary Measurements: 3.3 x 2.0 x 3.1 cm. Normal appearance/no adnexal mass. Other findings No free fluid. IMPRESSION: No acute findings. IUD in the lower uterine segment and cervical region. Electronically Signed   By: Charlett Nose M.D.   On: 10/28/2015 09:42   US Pelvis Complete  Result Date: 10/28/2015 CLINICAL DATA:  Acute pelvic inflammatory disease. IUD. Pelvic pain. EXAM: TRANSABDOMINAL AND TRANSVAGINAL ULTRASOUND OF PELVIS TECHNIQUE: Both transabdominal and transvaginal ultrasound examinations of the pelvis were performed. Transabdominal technique was performed for global imaging of the pelvis including uterus, ovaries, adnexal regions, and pelvic cul-de-sac. It was necessary to proceed with endovaginal exam following the transabdominal exam to visualize the endometrium. COMPARISON:  None FINDINGS: Uterus Measurements: 7.1 x 2.8 x 4.9 cm. No fibroids or other mass visualized. Endometrium Thickness: Normal thickness, 6 mm. No focal abnormality visualized. IUD noted in the cervix and lower uterine segment region. Right ovary Measurements: 2.9 x 2.4 x 1.7 cm. Normal appearance/no adnexal mass. Left ovary Measurements: 3.3 x 2.0 x 3.1 cm. Normal appearance/no adnexal mass.  Other findings No free fluid.  IMPRESSION: No acute findings. IUD in the lower uterine segment and cervical region. Electronically Signed   By: Charlett NoseKevin  Dover M.D.   On: 10/28/2015 09:42    Assessment & Plan:  IUD removal and reinsertion due to malposition of prior IUD.  Pt with presumed PID on a prior visit.  Cx not done at that time.Marland Kitchen.  Cx obtained prior to IUD removal and reinsertion.  Patient was given post-procedure instructions.  Patient was also asked to follow up in 4 weeks for IUD check.  F/u on GC/Clhamydia cx  Meko Masterson L. Harraway-Smith, M.D., Evern CoreFACOG

## 2015-11-06 LAB — NUSWAB VAGINITIS PLUS (VG+)
Candida albicans, NAA: POSITIVE — AB
Candida glabrata, NAA: POSITIVE — AB
Chlamydia trachomatis, NAA: NEGATIVE
Neisseria gonorrhoeae, NAA: NEGATIVE
TRICH VAG BY NAA: NEGATIVE

## 2015-11-10 ENCOUNTER — Other Ambulatory Visit: Payer: Self-pay | Admitting: Obstetrics & Gynecology

## 2015-11-10 DIAGNOSIS — B3731 Acute candidiasis of vulva and vagina: Secondary | ICD-10-CM

## 2015-11-10 DIAGNOSIS — B373 Candidiasis of vulva and vagina: Secondary | ICD-10-CM

## 2015-11-10 MED ORDER — FLUCONAZOLE 150 MG PO TABS: 150.0000 mg | ORAL_TABLET | Freq: Once | ORAL | 0 refills | Status: AC

## 2015-11-11 NOTE — Progress Notes (Signed)
LM on VM- Rx at pharmacy. Please call for results

## 2015-11-12 LAB — CHLAMYDIA/GONOCOCCUS/TRICHOMONAS, NAA
CHLAMYDIA BY NAA: NEGATIVE
Gonococcus by NAA: NEGATIVE
Trich vag by NAA: NEGATIVE

## 2015-11-13 ENCOUNTER — Other Ambulatory Visit: Payer: PRIVATE HEALTH INSURANCE

## 2015-11-14 ENCOUNTER — Ambulatory Visit: Payer: PRIVATE HEALTH INSURANCE | Admitting: Certified Nurse Midwife

## 2015-12-03 ENCOUNTER — Ambulatory Visit (INDEPENDENT_AMBULATORY_CARE_PROVIDER_SITE_OTHER): Payer: PRIVATE HEALTH INSURANCE | Admitting: Obstetrics and Gynecology

## 2015-12-03 ENCOUNTER — Encounter: Payer: Self-pay | Admitting: Obstetrics

## 2015-12-03 VITALS — BP 107/70 | HR 65

## 2015-12-03 DIAGNOSIS — B373 Candidiasis of vulva and vagina: Secondary | ICD-10-CM | POA: Diagnosis not present

## 2015-12-03 DIAGNOSIS — Z30431 Encounter for routine checking of intrauterine contraceptive device: Secondary | ICD-10-CM

## 2015-12-03 DIAGNOSIS — B3731 Acute candidiasis of vulva and vagina: Secondary | ICD-10-CM

## 2015-12-03 DIAGNOSIS — Z975 Presence of (intrauterine) contraceptive device: Secondary | ICD-10-CM

## 2015-12-03 MED ORDER — TERCONAZOLE 0.4 % VA CREA: 1.0000 | TOPICAL_CREAM | Freq: Every day | VAGINAL | 0 refills | Status: DC

## 2015-12-03 NOTE — Patient Instructions (Signed)

## 2015-12-03 NOTE — Progress Notes (Signed)
Pt states that she needs Rx for yeast infection after taking antibiotics Pt states that she is having irregular spotting with IUD107/70

## 2015-12-03 NOTE — Progress Notes (Signed)
Subjective:     Lynn Gonzales is a 20 y.o. female here for follow of Gonzales insertion. Lynn Gonzales inserted on 11/03/15. She reports some spotting and she thinks she still has a yeast infection. Was treated after last visit with Diflucan. But still with some itching and discharge. She been sexual active since Gonzales insertion wihtout problems.   Review of Systems Pertinent items are noted in HPI.    Objective:    PE  AF    VSS Lungs clear Heart RRR Pelvic Nl EGBUS some spotting noted, Gonzales string visualized    Assessment:    Gonzales check Yeast Vagitinis   Plan:    Will treat yeast with Terazol x 7 days F/U with yearly or PRN

## 2015-12-30 ENCOUNTER — Telehealth: Payer: Self-pay | Admitting: *Deleted

## 2015-12-30 NOTE — Telephone Encounter (Signed)
Pt called to office stating that she is still bleeding with Mirena.  Return call to pt. Pt states her IUD was placed in September.  Pt states that she is still bleeding and has bad cramping.  Pt made aware that irregular bleeding is normal with any device. Pt advised to take Tylenol/Motrin as needed for cramping.   Pt would like to know if any other suggestions. Please advise.

## 2016-01-20 ENCOUNTER — Ambulatory Visit: Payer: PRIVATE HEALTH INSURANCE | Admitting: Psychology

## 2016-01-20 ENCOUNTER — Ambulatory Visit (INDEPENDENT_AMBULATORY_CARE_PROVIDER_SITE_OTHER): Payer: PRIVATE HEALTH INSURANCE | Admitting: Psychology

## 2016-01-20 DIAGNOSIS — F3341 Major depressive disorder, recurrent, in partial remission: Secondary | ICD-10-CM | POA: Diagnosis not present

## 2016-01-28 ENCOUNTER — Ambulatory Visit (INDEPENDENT_AMBULATORY_CARE_PROVIDER_SITE_OTHER): Payer: PRIVATE HEALTH INSURANCE | Admitting: Psychology

## 2016-01-28 DIAGNOSIS — F3341 Major depressive disorder, recurrent, in partial remission: Secondary | ICD-10-CM | POA: Diagnosis not present

## 2016-02-05 ENCOUNTER — Ambulatory Visit (INDEPENDENT_AMBULATORY_CARE_PROVIDER_SITE_OTHER): Payer: PRIVATE HEALTH INSURANCE | Admitting: Psychology

## 2016-02-05 DIAGNOSIS — F411 Generalized anxiety disorder: Secondary | ICD-10-CM

## 2016-02-17 ENCOUNTER — Ambulatory Visit (INDEPENDENT_AMBULATORY_CARE_PROVIDER_SITE_OTHER): Payer: PRIVATE HEALTH INSURANCE | Admitting: Psychology

## 2016-02-17 DIAGNOSIS — F3341 Major depressive disorder, recurrent, in partial remission: Secondary | ICD-10-CM | POA: Diagnosis not present

## 2016-02-25 ENCOUNTER — Ambulatory Visit: Payer: Managed Care, Other (non HMO) | Admitting: Psychology

## 2016-03-02 ENCOUNTER — Ambulatory Visit (INDEPENDENT_AMBULATORY_CARE_PROVIDER_SITE_OTHER): Payer: PRIVATE HEALTH INSURANCE | Admitting: Psychology

## 2016-03-02 DIAGNOSIS — F3341 Major depressive disorder, recurrent, in partial remission: Secondary | ICD-10-CM | POA: Diagnosis not present

## 2016-03-04 ENCOUNTER — Ambulatory Visit (INDEPENDENT_AMBULATORY_CARE_PROVIDER_SITE_OTHER): Payer: PRIVATE HEALTH INSURANCE | Admitting: Family Medicine

## 2016-03-04 ENCOUNTER — Encounter: Payer: Self-pay | Admitting: Family Medicine

## 2016-03-04 VITALS — BP 112/73 | HR 76 | Wt 158.0 lb

## 2016-03-04 DIAGNOSIS — Z30017 Encounter for initial prescription of implantable subdermal contraceptive: Secondary | ICD-10-CM | POA: Diagnosis not present

## 2016-03-04 DIAGNOSIS — Z3202 Encounter for pregnancy test, result negative: Secondary | ICD-10-CM

## 2016-03-04 LAB — POCT URINE PREGNANCY: Preg Test, Ur: NEGATIVE

## 2016-03-04 MED ORDER — ETONOGESTREL 68 MG ~~LOC~~ IMPL
68.0000 mg | DRUG_IMPLANT | Freq: Once | SUBCUTANEOUS | Status: AC
  Administered 2016-03-04: 68 mg via SUBCUTANEOUS

## 2016-03-04 NOTE — Patient Instructions (Addendum)
Safe Sex Safe sex is about reducing the risk of giving or getting a sexually transmitted disease (STD). STDs are spread through sexual contact involving the genitals, mouth, or rectum. Some STDs can be cured and others cannot. Safe sex can also prevent unintended pregnancies.  WHAT ARE SOME SAFE SEX PRACTICES?  Limit your sexual activity to only one partner who is having sex with only you.  Talk to your partner about his or her past partners, past STDs, and drug use.  Use a condom every time you have sexual intercourse. This includes vaginal, oral, and anal sexual activity. Both females and males should wear condoms during oral sex. Only use latex or polyurethane condoms and water-based lubricants. Using petroleum-based lubricants or oils to lubricate a condom will weaken the condom and increase the chance that it will break. The condom should be in place from the beginning to the end of sexual activity. Wearing a condom reduces, but does not completely eliminate, your risk of getting or giving an STD. STDs can be spread by contact with infected body fluids and skin.  Get vaccinated for hepatitis B and HPV.  Avoid alcohol and recreational drugs, which can affect your judgment. You may forget to use a condom or participate in high-risk sex.  For females, avoid douching after sexual intercourse. Douching can spread an infection farther into the reproductive tract.  Check your body for signs of sores, blisters, rashes, or unusual discharge. See your health care provider if you notice any of these signs.  Avoid sexual contact if you have symptoms of an infection or are being treated for an STD. If you or your partner has herpes, avoid sexual contact when blisters are present. Use condoms at all other times.  If you are at risk of being infected with HIV, it is recommended that you take a prescription medicine daily to prevent HIV infection. This is called pre-exposure prophylaxis (PrEP). You are  considered at risk if:  You are a man who has sex with other men (MSM).  You are a heterosexual man or woman who is sexually active with more than one partner.  You take drugs by injection.  You are sexually active with a partner who has HIV.  Talk with your health care provider about whether you are at high risk of being infected with HIV. If you choose to begin PrEP, you should first be tested for HIV. You should then be tested every 3 months for as long as you are taking PrEP.  See your health care provider for regular screenings, exams, and tests for other STDs. Before having sex with a new partner, each of you should be screened for STDs and should talk about the results with each other. WHAT ARE THE BENEFITS OF SAFE SEX?   There is less chance of getting or giving an STD.  You can prevent unwanted or unintended pregnancies.  By discussing safe sex concerns with your partner, you may increase feelings of intimacy, comfort, trust, and honesty between the two of you. This information is not intended to replace advice given to you by your health care provider. Make sure you discuss any questions you have with your health care provider. Document Released: 03/11/2004 Document Revised: 02/22/2014 Document Reviewed: 12/22/2014 Elsevier Interactive Patient Education  2017 [employer]. Etonogestrel implant What is this medicine? ETONOGESTREL (et oh noe JES trel) is a contraceptive (birth control) device. It is used to prevent pregnancy. It can be used for up to 3 years. COMMON  BRAND NAME(S): Implanon, Nexplanon What should I tell my health care provider before I take this medicine? They need to know if you have any of these conditions: -abnormal vaginal bleeding -blood vessel disease or blood clots -cancer of the breast, cervix, or liver -depression -diabetes -gallbladder disease -headaches -heart disease or recent heart attack -high blood pressure -high cholesterol -kidney  disease -liver disease -renal disease -seizures -tobacco smoker -an unusual or allergic reaction to etonogestrel, other hormones, anesthetics or antiseptics, medicines, foods, dyes, or preservatives -pregnant or trying to get pregnant -breast-feeding How should I use this medicine? This device is inserted just under the skin on the inner side of your upper arm by a health care professional. Talk to your pediatrician regarding the use of this medicine in children. Special care may be needed. What if I miss a dose? This does not apply. What may interact with this medicine? Do not take this medicine with any of the following medications: -amprenavir -bosentan -fosamprenavir This medicine may also interact with the following medications: -barbiturate medicines for inducing sleep or treating seizures -certain medicines for fungal infections like ketoconazole and itraconazole -griseofulvin -medicines to treat seizures like carbamazepine, felbamate, oxcarbazepine, phenytoin, topiramate -modafinil -phenylbutazone -rifampin -some medicines to treat HIV infection like atazanavir, indinavir, lopinavir, nelfinavir, tipranavir, ritonavir -St. John's wort What should I watch for while using this medicine? This product does not protect you against HIV infection (AIDS) or other sexually transmitted diseases. You should be able to feel the implant by pressing your fingertips over the skin where it was inserted. Contact your doctor if you cannot feel the implant, and use a non-hormonal birth control method (such as condoms) until your doctor confirms that the implant is in place. If you feel that the implant may have broken or become bent while in your arm, contact your healthcare provider. What side effects may I notice from receiving this medicine? Side effects that you should report to your doctor or health care professional as soon as possible: -allergic reactions like skin rash, itching or hives,  swelling of the face, lips, or tongue -breast lumps -changes in emotions or moods -depressed mood -heavy or prolonged menstrual bleeding -pain, irritation, swelling, or bruising at the insertion site -scar at site of insertion -signs of infection at the insertion site such as fever, and skin redness, pain or discharge -signs of pregnancy -signs and symptoms of a blood clot such as breathing problems; changes in vision; chest pain; severe, sudden headache; pain, swelling, warmth in the leg; trouble speaking; sudden numbness or weakness of the face, arm or leg -signs and symptoms of liver injury like dark yellow or brown urine; general ill feeling or flu-like symptoms; light-colored stools; loss of appetite; nausea; right upper belly pain; unusually weak or tired; yellowing of the eyes or skin -unusual vaginal bleeding, discharge -signs and symptoms of a stroke like changes in vision; confusion; trouble speaking or understanding; severe headaches; sudden numbness or weakness of the face, arm or leg; trouble walking; dizziness; loss of balance or coordination Side effects that usually do not require medical attention (report to your doctor or health care professional if they continue or are bothersome): -acne -back pain -breast pain -changes in weight -dizziness -general ill feeling or flu-like symptoms -headache -irregular menstrual bleeding -nausea -sore throat -vaginal irritation or inflammation Where should I keep my medicine? This drug is given in a hospital or clinic and will not be stored at home.  2017 Elsevier/Gold Standard (2015-03-06 10:56:20)

## 2016-03-04 NOTE — Progress Notes (Signed)
Patient is in the office and states that her IUD fell out 2 days ago, and wants to see other birth control options. Patient states she does not need STD testing today.

## 2016-03-04 NOTE — Progress Notes (Signed)
   Subjective:    Patient ID: Lynn Gonzales is a 21 y.o. female presenting with GYN (Patient is in the office for GYN visit.)  on 03/04/2016  HPI: IUD placed in 9/17. IUD fell out during cycle on 03/01/16. [facility] and was [facility] by [facility]/s and so had it re-placed by Mirena. Now this one has fallen out. She is a G0. Not interested in pregnancy at this time. Skyla placed due to not doing well with OC's. Tried several had weight gain and acne with those. Placed on these for contraception and for bleeding and ovarian cysts. Reports no intercourse since IUD fell out.  Review of Systems  Constitutional: Negative for chills and fever.  Respiratory: Negative for shortness of breath.   Cardiovascular: Negative for chest pain.  Gastrointestinal: Negative for abdominal pain, nausea and vomiting.  Genitourinary: Negative for dysuria.  Skin: Negative for rash.      Objective:    BP 112/73   Pulse 76   Wt 158 lb (71.7 kg)   BMI 27.12 kg/m  Physical Exam  Constitutional: She is oriented to person, place, and time. She appears well-developed and well-nourished. No distress.  HENT:  Head: Normocephalic and atraumatic.  Eyes: No scleral icterus.  Neck: Neck supple.  Cardiovascular: Normal rate.   Pulmonary/Chest: Effort normal.  Abdominal: Soft.  Neurological: She is alert and oriented to person, place, and time.  Skin: Skin is warm and dry.  Psychiatric: She has a normal mood and affect.   Procedure: Patient given informed consent, signed copy in the chart, time out was performed. Pregnancy test was neg. Appropriate time out taken.  Patient's left arm was prepped and draped in the usual sterile fashion.. The ruler used to measure and mark insertion area.  Pt was prepped with alcohol swab and then injected with 3 cc of 1% lidocaine with epinephrine.  Pt was prepped with betadine, Nexplanon removed form packaging,  Device confirmed in needle, then inserted full length of needle  and withdrawn per handbook instructions.  Pt insertion site covered with pressure dressing.   Minimal blood loss.  Pt tolerated the procedure well.       Assessment & Plan:  Nexplanon insertion - Advised of bleeding profile, ok with it. Also discussed mood changes. Offered NuvaRing as well. Wants Nexplanon. - Plan: POCT urine pregnancy, etonogestrel (NEXPLANON) implant 68 mg   Total face-to-face time with patient: 15 minutes. Over 50% of encounter was spent on counseling and coordination of care. Return in about 3 months (around 06/02/2016) for a follow-up.  Reva Boresanya S Alahni Varone 03/04/2016 1:19 PM

## 2016-03-09 ENCOUNTER — Ambulatory Visit (INDEPENDENT_AMBULATORY_CARE_PROVIDER_SITE_OTHER): Payer: PRIVATE HEALTH INSURANCE | Admitting: Psychology

## 2016-03-09 DIAGNOSIS — F3341 Major depressive disorder, recurrent, in partial remission: Secondary | ICD-10-CM

## 2016-03-11 ENCOUNTER — Other Ambulatory Visit: Payer: Self-pay | Admitting: *Deleted

## 2016-03-11 DIAGNOSIS — B379 Candidiasis, unspecified: Secondary | ICD-10-CM

## 2016-03-11 DIAGNOSIS — R399 Unspecified symptoms and signs involving the genitourinary system: Secondary | ICD-10-CM

## 2016-03-11 MED ORDER — SULFAMETHOXAZOLE-TRIMETHOPRIM 800-160 MG PO TABS: 1.0000 | ORAL_TABLET | Freq: Two times a day (BID) | ORAL | 0 refills | Status: DC

## 2016-03-11 MED ORDER — FLUCONAZOLE 150 MG PO TABS: 150.0000 mg | ORAL_TABLET | Freq: Once | ORAL | 0 refills | Status: AC

## 2016-03-11 NOTE — Progress Notes (Signed)
Pt called to office with symptoms of UTI and yeast infection. Pt was treated per protocol.  Pt advised to f/u with office if symptoms do not improve.

## 2016-03-16 ENCOUNTER — Ambulatory Visit (INDEPENDENT_AMBULATORY_CARE_PROVIDER_SITE_OTHER): Payer: PRIVATE HEALTH INSURANCE | Admitting: Psychology

## 2016-03-16 DIAGNOSIS — F3341 Major depressive disorder, recurrent, in partial remission: Secondary | ICD-10-CM | POA: Diagnosis not present

## 2016-03-23 ENCOUNTER — Ambulatory Visit: Payer: Managed Care, Other (non HMO) | Admitting: Psychology

## 2016-03-30 ENCOUNTER — Ambulatory Visit (INDEPENDENT_AMBULATORY_CARE_PROVIDER_SITE_OTHER): Payer: PRIVATE HEALTH INSURANCE | Admitting: Psychology

## 2016-03-30 DIAGNOSIS — F3341 Major depressive disorder, recurrent, in partial remission: Secondary | ICD-10-CM | POA: Diagnosis not present

## 2016-04-06 ENCOUNTER — Ambulatory Visit (INDEPENDENT_AMBULATORY_CARE_PROVIDER_SITE_OTHER): Payer: PRIVATE HEALTH INSURANCE | Admitting: Psychology

## 2016-04-06 DIAGNOSIS — F3341 Major depressive disorder, recurrent, in partial remission: Secondary | ICD-10-CM | POA: Diagnosis not present

## 2016-04-13 ENCOUNTER — Ambulatory Visit (INDEPENDENT_AMBULATORY_CARE_PROVIDER_SITE_OTHER): Payer: PRIVATE HEALTH INSURANCE | Admitting: Psychology

## 2016-04-13 DIAGNOSIS — F3341 Major depressive disorder, recurrent, in partial remission: Secondary | ICD-10-CM | POA: Diagnosis not present

## 2016-04-20 ENCOUNTER — Ambulatory Visit: Payer: Self-pay | Admitting: Psychology

## 2016-04-27 ENCOUNTER — Ambulatory Visit: Payer: Self-pay | Admitting: Psychology

## 2016-05-04 ENCOUNTER — Ambulatory Visit: Payer: Self-pay | Admitting: Psychology

## 2016-05-11 ENCOUNTER — Ambulatory Visit (INDEPENDENT_AMBULATORY_CARE_PROVIDER_SITE_OTHER): Payer: BLUE CROSS/BLUE SHIELD | Admitting: Psychology

## 2016-05-11 DIAGNOSIS — F3341 Major depressive disorder, recurrent, in partial remission: Secondary | ICD-10-CM | POA: Diagnosis not present

## 2016-05-25 ENCOUNTER — Ambulatory Visit (INDEPENDENT_AMBULATORY_CARE_PROVIDER_SITE_OTHER): Payer: BLUE CROSS/BLUE SHIELD | Admitting: Psychology

## 2016-05-25 DIAGNOSIS — F3341 Major depressive disorder, recurrent, in partial remission: Secondary | ICD-10-CM

## 2016-06-01 ENCOUNTER — Ambulatory Visit: Payer: BLUE CROSS/BLUE SHIELD | Admitting: Psychology

## 2016-06-08 ENCOUNTER — Ambulatory Visit (INDEPENDENT_AMBULATORY_CARE_PROVIDER_SITE_OTHER): Payer: PRIVATE HEALTH INSURANCE | Admitting: Psychology

## 2016-06-08 DIAGNOSIS — F3341 Major depressive disorder, recurrent, in partial remission: Secondary | ICD-10-CM

## 2016-06-15 ENCOUNTER — Ambulatory Visit (INDEPENDENT_AMBULATORY_CARE_PROVIDER_SITE_OTHER): Payer: PRIVATE HEALTH INSURANCE | Admitting: Psychology

## 2016-06-15 DIAGNOSIS — F3341 Major depressive disorder, recurrent, in partial remission: Secondary | ICD-10-CM

## 2016-06-22 ENCOUNTER — Ambulatory Visit: Payer: BLUE CROSS/BLUE SHIELD | Admitting: Psychology

## 2016-06-29 ENCOUNTER — Ambulatory Visit (INDEPENDENT_AMBULATORY_CARE_PROVIDER_SITE_OTHER): Payer: PRIVATE HEALTH INSURANCE | Admitting: Psychology

## 2016-06-29 DIAGNOSIS — F3341 Major depressive disorder, recurrent, in partial remission: Secondary | ICD-10-CM

## 2016-07-06 ENCOUNTER — Ambulatory Visit: Payer: BLUE CROSS/BLUE SHIELD | Admitting: Psychology

## 2016-07-13 ENCOUNTER — Ambulatory Visit: Payer: Self-pay | Admitting: Psychology

## 2016-07-20 ENCOUNTER — Ambulatory Visit: Payer: Self-pay | Admitting: Psychology

## 2016-07-27 ENCOUNTER — Ambulatory Visit: Payer: BLUE CROSS/BLUE SHIELD | Admitting: Psychology

## 2016-08-03 DIAGNOSIS — Z9109 Other allergy status, other than to drugs and biological substances: Secondary | ICD-10-CM | POA: Insufficient documentation

## 2016-08-03 DIAGNOSIS — F900 Attention-deficit hyperactivity disorder, predominantly inattentive type: Secondary | ICD-10-CM | POA: Insufficient documentation

## 2016-08-17 ENCOUNTER — Ambulatory Visit (INDEPENDENT_AMBULATORY_CARE_PROVIDER_SITE_OTHER): Payer: BLUE CROSS/BLUE SHIELD | Admitting: Psychology

## 2016-08-17 DIAGNOSIS — F3341 Major depressive disorder, recurrent, in partial remission: Secondary | ICD-10-CM

## 2016-08-24 ENCOUNTER — Ambulatory Visit: Payer: BLUE CROSS/BLUE SHIELD | Admitting: Psychology

## 2016-09-07 ENCOUNTER — Ambulatory Visit (INDEPENDENT_AMBULATORY_CARE_PROVIDER_SITE_OTHER): Payer: BLUE CROSS/BLUE SHIELD | Admitting: Psychology

## 2016-09-07 DIAGNOSIS — F3341 Major depressive disorder, recurrent, in partial remission: Secondary | ICD-10-CM | POA: Diagnosis not present

## 2016-09-21 ENCOUNTER — Ambulatory Visit (INDEPENDENT_AMBULATORY_CARE_PROVIDER_SITE_OTHER): Payer: BLUE CROSS/BLUE SHIELD | Admitting: Psychology

## 2016-09-21 DIAGNOSIS — F3341 Major depressive disorder, recurrent, in partial remission: Secondary | ICD-10-CM

## 2016-09-28 ENCOUNTER — Ambulatory Visit (INDEPENDENT_AMBULATORY_CARE_PROVIDER_SITE_OTHER): Payer: BLUE CROSS/BLUE SHIELD | Admitting: Psychology

## 2016-09-28 DIAGNOSIS — F3341 Major depressive disorder, recurrent, in partial remission: Secondary | ICD-10-CM | POA: Diagnosis not present

## 2016-10-05 ENCOUNTER — Ambulatory Visit: Payer: BLUE CROSS/BLUE SHIELD | Admitting: Psychology

## 2016-10-12 ENCOUNTER — Ambulatory Visit (INDEPENDENT_AMBULATORY_CARE_PROVIDER_SITE_OTHER): Payer: BLUE CROSS/BLUE SHIELD | Admitting: Psychology

## 2016-10-12 DIAGNOSIS — F3341 Major depressive disorder, recurrent, in partial remission: Secondary | ICD-10-CM

## 2016-10-19 ENCOUNTER — Ambulatory Visit (INDEPENDENT_AMBULATORY_CARE_PROVIDER_SITE_OTHER): Payer: PRIVATE HEALTH INSURANCE | Admitting: Psychology

## 2016-10-19 DIAGNOSIS — F3341 Major depressive disorder, recurrent, in partial remission: Secondary | ICD-10-CM

## 2016-10-26 ENCOUNTER — Ambulatory Visit: Payer: Self-pay | Admitting: Psychology

## 2016-11-02 ENCOUNTER — Ambulatory Visit: Payer: BLUE CROSS/BLUE SHIELD | Admitting: Psychology

## 2016-11-09 ENCOUNTER — Ambulatory Visit: Payer: Self-pay | Admitting: Psychology

## 2016-11-15 ENCOUNTER — Ambulatory Visit (INDEPENDENT_AMBULATORY_CARE_PROVIDER_SITE_OTHER): Payer: PRIVATE HEALTH INSURANCE | Admitting: Psychology

## 2016-11-15 DIAGNOSIS — F331 Major depressive disorder, recurrent, moderate: Secondary | ICD-10-CM | POA: Diagnosis not present

## 2016-11-16 ENCOUNTER — Ambulatory Visit (INDEPENDENT_AMBULATORY_CARE_PROVIDER_SITE_OTHER): Payer: PRIVATE HEALTH INSURANCE | Admitting: Psychology

## 2016-11-16 DIAGNOSIS — F331 Major depressive disorder, recurrent, moderate: Secondary | ICD-10-CM

## 2016-11-18 ENCOUNTER — Ambulatory Visit (INDEPENDENT_AMBULATORY_CARE_PROVIDER_SITE_OTHER): Payer: PRIVATE HEALTH INSURANCE | Admitting: Psychology

## 2016-11-18 DIAGNOSIS — F331 Major depressive disorder, recurrent, moderate: Secondary | ICD-10-CM | POA: Diagnosis not present

## 2016-11-23 ENCOUNTER — Ambulatory Visit (INDEPENDENT_AMBULATORY_CARE_PROVIDER_SITE_OTHER): Payer: PRIVATE HEALTH INSURANCE | Admitting: Psychology

## 2016-11-23 DIAGNOSIS — F3341 Major depressive disorder, recurrent, in partial remission: Secondary | ICD-10-CM

## 2016-11-23 IMAGING — US US PELVIS COMPLETE
1 series · 15 of 25 positions shown · non-contrast
Comparison: None

CLINICAL DATA: Acute pelvic inflammatory disease. IUD. Pelvic pain.



[Series 1: us pelvis complete · 105 acquisitions, 15 frames shown]
[im 1/105]
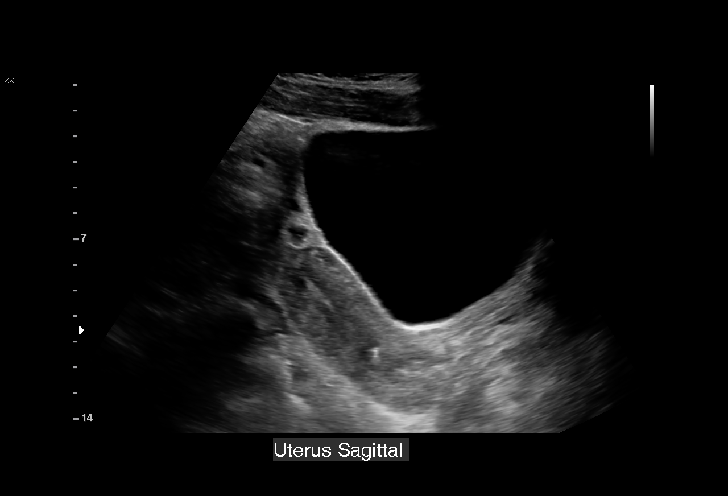
[im 9/105]
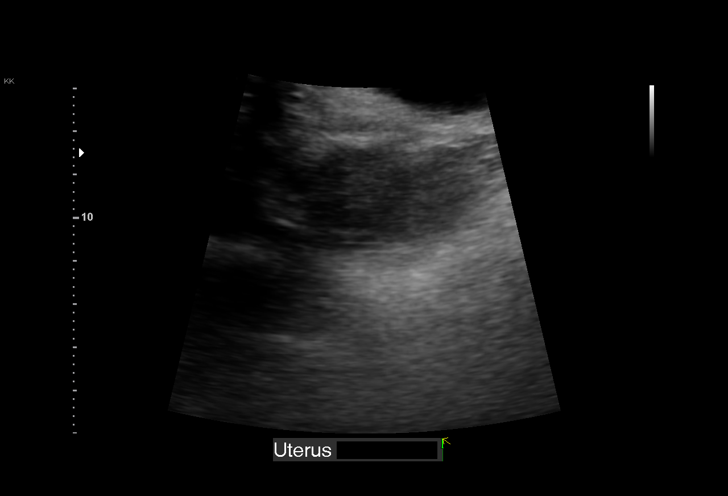
[im 18/105]
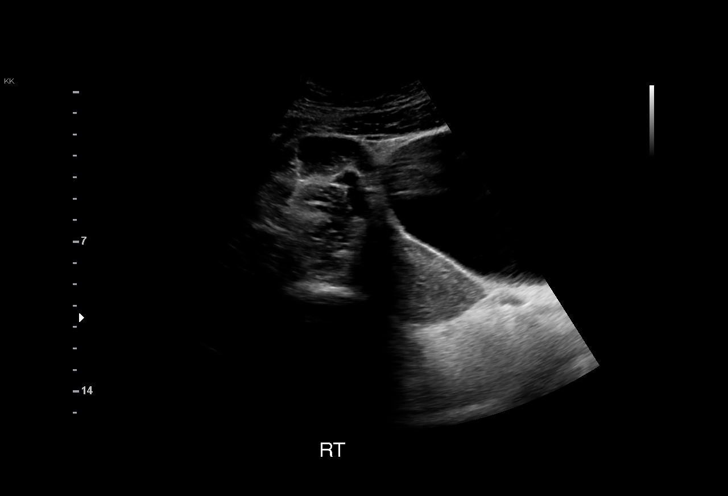
[im 22/105]
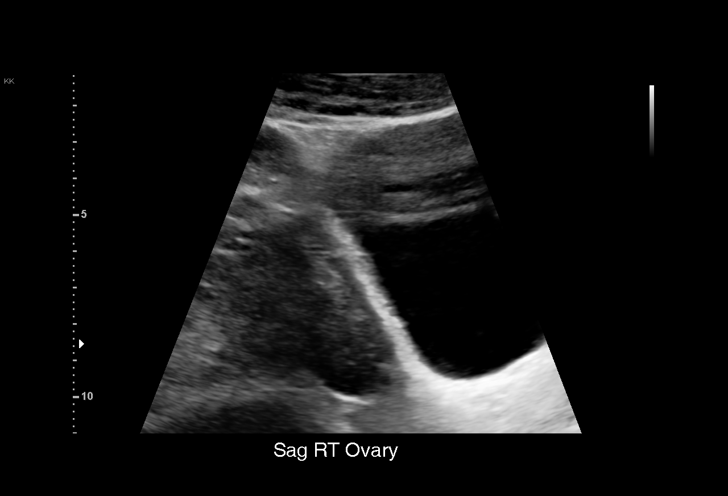
[im 31/105]
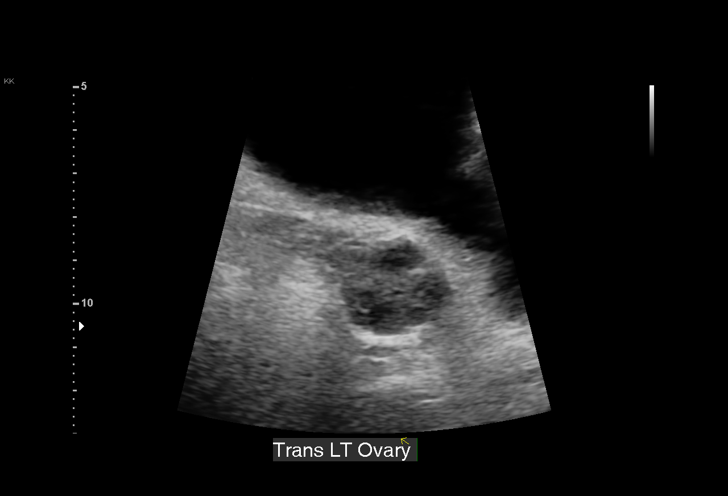
[im 40/105]
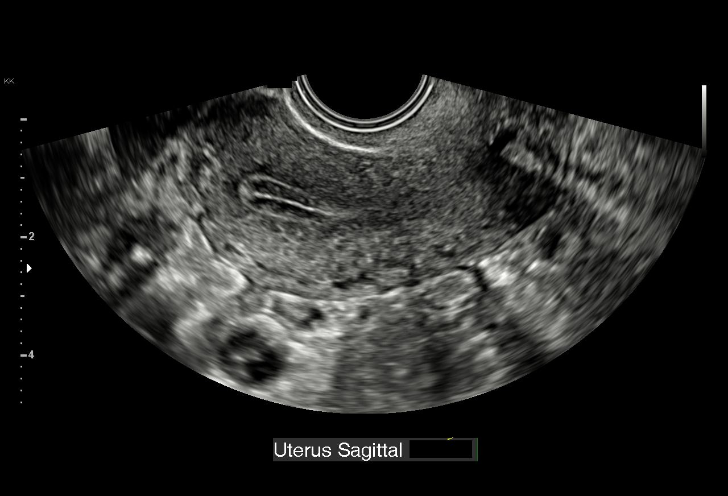
[im 44/105]
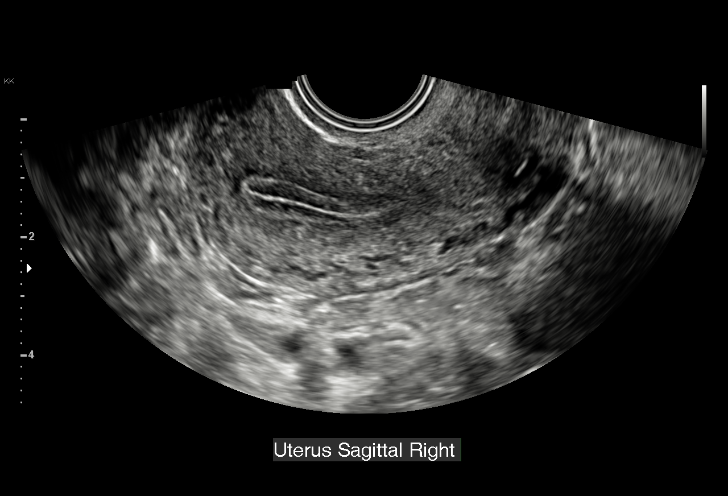
[im 53/105]
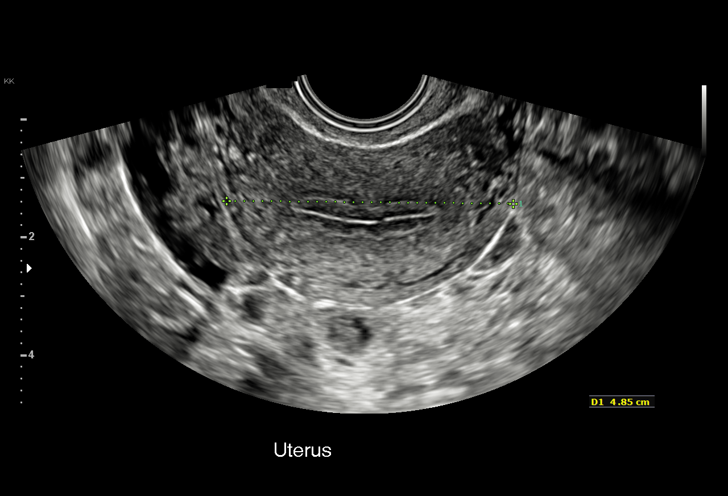
[im 61/105]
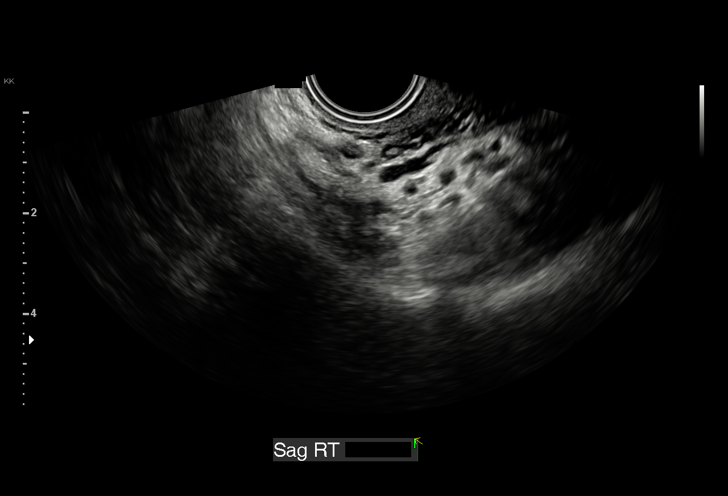
[im 66/105]
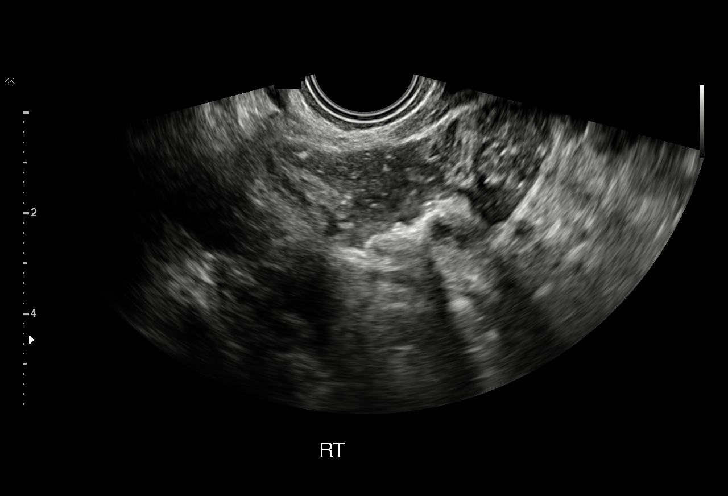
[im 74/105]
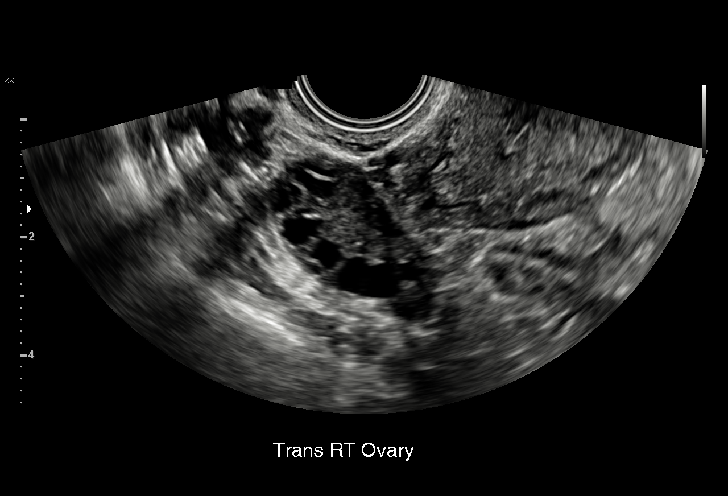
[im 83/105]
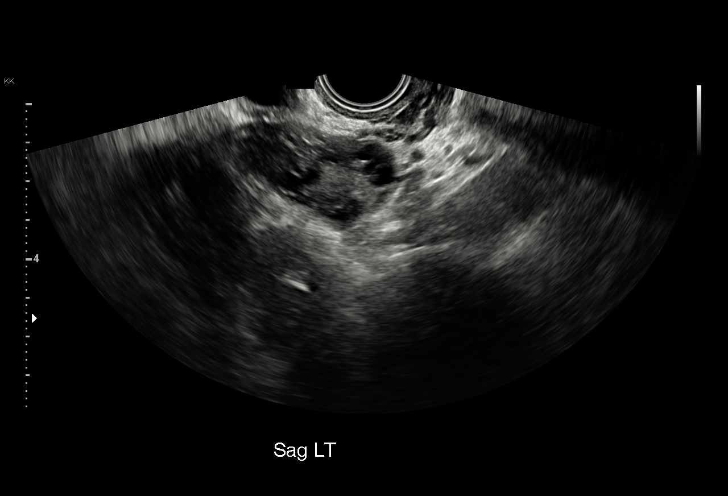
[im 87/105]
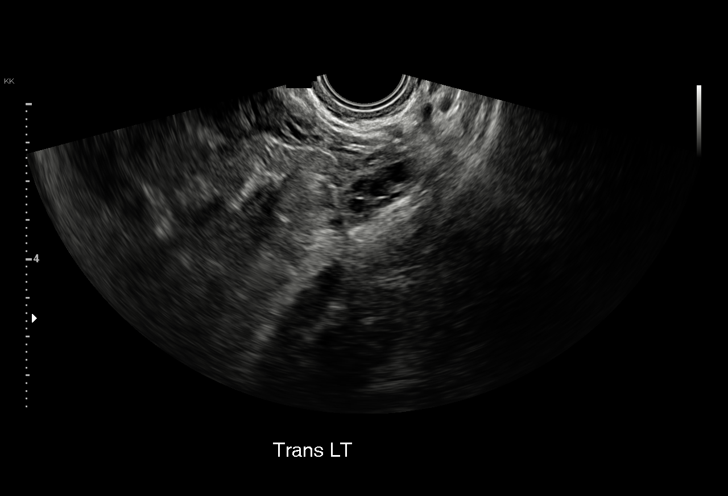
[im 96/105]
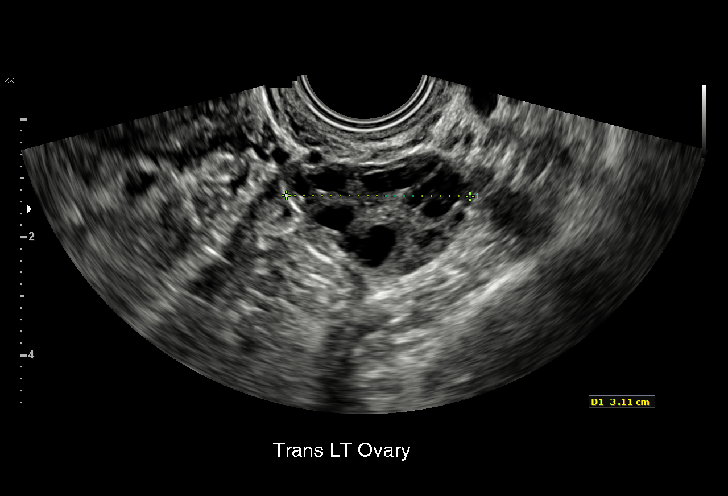
[im 105/105]
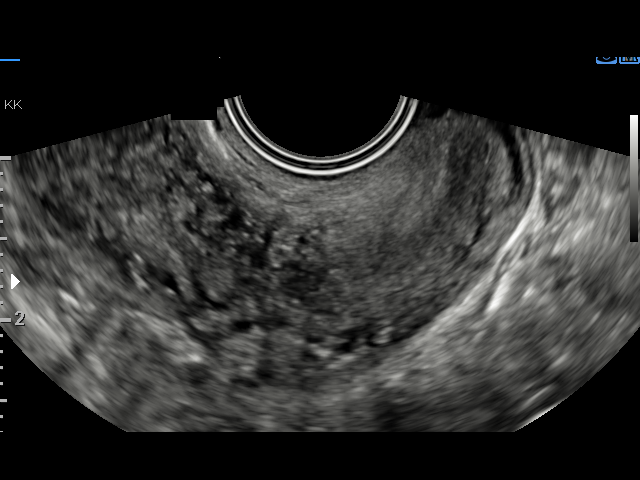

[15 of 25 positions shown; findings below may reference images not displayed]

FINDINGS: Uterus

Measurements: 7.1 x 2.8 x 4.9 cm. No fibroids or other mass
visualized.

Endometrium

Thickness: Normal thickness, 6 mm. No focal abnormality visualized.
IUD noted in the cervix and lower uterine segment region.

Right ovary

Measurements: 2.9 x 2.4 x 1.7 cm. Normal appearance/no adnexal mass.

Left ovary

Measurements: 3.3 x 2.0 x 3.1 cm. Normal appearance/no adnexal mass.

Other findings

No free fluid.
IMPRESSION: No acute findings.

IUD in the lower uterine segment and cervical region.

## 2016-11-24 ENCOUNTER — Ambulatory Visit: Payer: BLUE CROSS/BLUE SHIELD | Admitting: Psychology

## 2016-11-25 ENCOUNTER — Ambulatory Visit (INDEPENDENT_AMBULATORY_CARE_PROVIDER_SITE_OTHER): Payer: PRIVATE HEALTH INSURANCE | Admitting: Psychology

## 2016-11-25 DIAGNOSIS — F331 Major depressive disorder, recurrent, moderate: Secondary | ICD-10-CM

## 2016-11-30 ENCOUNTER — Ambulatory Visit (INDEPENDENT_AMBULATORY_CARE_PROVIDER_SITE_OTHER): Payer: PRIVATE HEALTH INSURANCE | Admitting: Psychology

## 2016-11-30 DIAGNOSIS — F331 Major depressive disorder, recurrent, moderate: Secondary | ICD-10-CM

## 2016-12-02 ENCOUNTER — Ambulatory Visit: Payer: Self-pay | Admitting: Psychology

## 2016-12-02 ENCOUNTER — Ambulatory Visit: Payer: BLUE CROSS/BLUE SHIELD | Admitting: Psychology

## 2016-12-07 ENCOUNTER — Ambulatory Visit (INDEPENDENT_AMBULATORY_CARE_PROVIDER_SITE_OTHER): Payer: PRIVATE HEALTH INSURANCE | Admitting: Psychology

## 2016-12-07 DIAGNOSIS — F3341 Major depressive disorder, recurrent, in partial remission: Secondary | ICD-10-CM | POA: Diagnosis not present

## 2016-12-14 ENCOUNTER — Ambulatory Visit (INDEPENDENT_AMBULATORY_CARE_PROVIDER_SITE_OTHER): Payer: PRIVATE HEALTH INSURANCE | Admitting: Psychology

## 2016-12-14 DIAGNOSIS — F3341 Major depressive disorder, recurrent, in partial remission: Secondary | ICD-10-CM

## 2016-12-21 ENCOUNTER — Ambulatory Visit (INDEPENDENT_AMBULATORY_CARE_PROVIDER_SITE_OTHER): Payer: PRIVATE HEALTH INSURANCE | Admitting: Psychology

## 2016-12-21 DIAGNOSIS — F331 Major depressive disorder, recurrent, moderate: Secondary | ICD-10-CM | POA: Diagnosis not present

## 2016-12-23 ENCOUNTER — Ambulatory Visit (INDEPENDENT_AMBULATORY_CARE_PROVIDER_SITE_OTHER): Payer: PRIVATE HEALTH INSURANCE | Admitting: Psychology

## 2016-12-23 DIAGNOSIS — F331 Major depressive disorder, recurrent, moderate: Secondary | ICD-10-CM | POA: Diagnosis not present

## 2016-12-24 ENCOUNTER — Ambulatory Visit (HOSPITAL_COMMUNITY)
Admission: RE | Admit: 2016-12-24 | Discharge: 2016-12-24 | Disposition: A | Discharge status: Home / Self Care | Payer: PRIVATE HEALTH INSURANCE | Attending: Psychiatry | Admitting: Psychiatry

## 2016-12-24 DIAGNOSIS — R45851 Suicidal ideations: Secondary | ICD-10-CM | POA: Insufficient documentation

## 2016-12-24 DIAGNOSIS — F4310 Post-traumatic stress disorder, unspecified: Secondary | ICD-10-CM | POA: Diagnosis not present

## 2016-12-24 DIAGNOSIS — Z1330 Encounter for screening examination for mental health and behavioral disorders, unspecified: Secondary | ICD-10-CM | POA: Diagnosis present

## 2016-12-24 DIAGNOSIS — F329 Major depressive disorder, single episode, unspecified: Secondary | ICD-10-CM | POA: Diagnosis not present
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
## 2016-12-27 ENCOUNTER — Ambulatory Visit (INDEPENDENT_AMBULATORY_CARE_PROVIDER_SITE_OTHER): Payer: PRIVATE HEALTH INSURANCE | Admitting: Psychology

## 2016-12-27 DIAGNOSIS — F331 Major depressive disorder, recurrent, moderate: Secondary | ICD-10-CM | POA: Diagnosis not present

## 2016-12-28 ENCOUNTER — Ambulatory Visit (INDEPENDENT_AMBULATORY_CARE_PROVIDER_SITE_OTHER): Payer: PRIVATE HEALTH INSURANCE | Admitting: Psychology

## 2016-12-28 DIAGNOSIS — F331 Major depressive disorder, recurrent, moderate: Secondary | ICD-10-CM | POA: Diagnosis not present

## 2017-01-04 ENCOUNTER — Ambulatory Visit (INDEPENDENT_AMBULATORY_CARE_PROVIDER_SITE_OTHER): Payer: PRIVATE HEALTH INSURANCE | Admitting: Psychology

## 2017-01-04 DIAGNOSIS — F3341 Major depressive disorder, recurrent, in partial remission: Secondary | ICD-10-CM | POA: Diagnosis not present

## 2017-01-11 ENCOUNTER — Ambulatory Visit: Payer: Self-pay | Admitting: Psychology

## 2017-01-18 ENCOUNTER — Ambulatory Visit (INDEPENDENT_AMBULATORY_CARE_PROVIDER_SITE_OTHER): Payer: PRIVATE HEALTH INSURANCE | Admitting: Psychology

## 2017-01-18 DIAGNOSIS — F3341 Major depressive disorder, recurrent, in partial remission: Secondary | ICD-10-CM | POA: Diagnosis not present

## 2017-01-25 ENCOUNTER — Ambulatory Visit: Payer: Self-pay | Admitting: Psychology

## 2017-02-01 ENCOUNTER — Ambulatory Visit (INDEPENDENT_AMBULATORY_CARE_PROVIDER_SITE_OTHER): Payer: PRIVATE HEALTH INSURANCE | Admitting: Psychology

## 2017-02-01 DIAGNOSIS — F3341 Major depressive disorder, recurrent, in partial remission: Secondary | ICD-10-CM | POA: Diagnosis not present

## 2017-02-22 ENCOUNTER — Ambulatory Visit: Payer: BLUE CROSS/BLUE SHIELD | Admitting: Psychology

## 2017-03-01 ENCOUNTER — Ambulatory Visit: Payer: Self-pay | Admitting: Psychology

## 2017-03-03 ENCOUNTER — Other Ambulatory Visit: Payer: Self-pay | Admitting: Physician Assistant

## 2017-03-03 DIAGNOSIS — R1032 Left lower quadrant pain: Secondary | ICD-10-CM

## 2017-03-08 ENCOUNTER — Ambulatory Visit: Payer: BLUE CROSS/BLUE SHIELD | Admitting: Psychology

## 2017-03-15 ENCOUNTER — Ambulatory Visit: Payer: BLUE CROSS/BLUE SHIELD | Admitting: Psychology

## 2017-03-22 ENCOUNTER — Ambulatory Visit: Payer: BLUE CROSS/BLUE SHIELD | Admitting: Psychology

## 2017-03-28 ENCOUNTER — Ambulatory Visit (INDEPENDENT_AMBULATORY_CARE_PROVIDER_SITE_OTHER): Payer: PRIVATE HEALTH INSURANCE | Admitting: Psychology

## 2017-03-28 DIAGNOSIS — F331 Major depressive disorder, recurrent, moderate: Secondary | ICD-10-CM | POA: Diagnosis not present

## 2017-04-05 ENCOUNTER — Ambulatory Visit
Admission: RE | Admit: 2017-04-05 | Discharge: 2017-04-05 | Disposition: A | Discharge status: Home / Self Care | Payer: BLUE CROSS/BLUE SHIELD | Source: Ambulatory Visit | Attending: Physician Assistant | Admitting: Physician Assistant

## 2017-04-05 DIAGNOSIS — R1032 Left lower quadrant pain: Secondary | ICD-10-CM

## 2017-04-12 ENCOUNTER — Other Ambulatory Visit: Payer: Self-pay | Admitting: Physician Assistant

## 2017-04-12 DIAGNOSIS — R9389 Abnormal findings on diagnostic imaging of other specified body structures: Secondary | ICD-10-CM

## 2017-04-19 ENCOUNTER — Other Ambulatory Visit: Payer: Self-pay | Admitting: Physician Assistant

## 2017-04-19 ENCOUNTER — Other Ambulatory Visit (HOSPITAL_COMMUNITY)
Admission: RE | Admit: 2017-04-19 | Discharge: 2017-04-19 | Disposition: A | Discharge status: Home / Self Care | Payer: PRIVATE HEALTH INSURANCE | Source: Ambulatory Visit | Attending: Physician Assistant | Admitting: Physician Assistant

## 2017-04-19 DIAGNOSIS — Z0000 Encounter for general adult medical examination without abnormal findings: Secondary | ICD-10-CM | POA: Diagnosis present

## 2017-04-23 LAB — CYTOLOGY - PAP
BACTERIAL VAGINITIS: NEGATIVE
Candida vaginitis: POSITIVE — AB
DIAGNOSIS: UNDETERMINED — AB
HPV: DETECTED — AB
Trichomonas: NEGATIVE

## 2017-04-25 ENCOUNTER — Ambulatory Visit (INDEPENDENT_AMBULATORY_CARE_PROVIDER_SITE_OTHER): Payer: PRIVATE HEALTH INSURANCE | Admitting: Psychology

## 2017-04-25 DIAGNOSIS — F331 Major depressive disorder, recurrent, moderate: Secondary | ICD-10-CM

## 2017-04-29 ENCOUNTER — Ambulatory Visit
Admission: RE | Admit: 2017-04-29 | Discharge: 2017-04-29 | Disposition: A | Discharge status: Home / Self Care | Payer: PRIVATE HEALTH INSURANCE | Source: Ambulatory Visit | Attending: Physician Assistant | Admitting: Physician Assistant

## 2017-04-29 DIAGNOSIS — R9389 Abnormal findings on diagnostic imaging of other specified body structures: Secondary | ICD-10-CM

## 2017-04-29 MED ORDER — IOPAMIDOL (ISOVUE-300) INJECTION 61%
100.0000 mL | Freq: Once | INTRAVENOUS | Status: AC | PRN
  Administered 2017-04-29: 100 mL via INTRAVENOUS

## 2017-05-17 ENCOUNTER — Ambulatory Visit (INDEPENDENT_AMBULATORY_CARE_PROVIDER_SITE_OTHER): Payer: PRIVATE HEALTH INSURANCE | Admitting: Psychology

## 2017-05-17 DIAGNOSIS — F320 Major depressive disorder, single episode, mild: Secondary | ICD-10-CM | POA: Diagnosis not present

## 2017-05-31 ENCOUNTER — Ambulatory Visit (INDEPENDENT_AMBULATORY_CARE_PROVIDER_SITE_OTHER): Payer: PRIVATE HEALTH INSURANCE | Admitting: Psychology

## 2017-05-31 DIAGNOSIS — F331 Major depressive disorder, recurrent, moderate: Secondary | ICD-10-CM

## 2017-06-02 ENCOUNTER — Ambulatory Visit: Payer: BLUE CROSS/BLUE SHIELD | Admitting: Psychology

## 2017-06-14 ENCOUNTER — Ambulatory Visit (INDEPENDENT_AMBULATORY_CARE_PROVIDER_SITE_OTHER): Payer: PRIVATE HEALTH INSURANCE | Admitting: Psychology

## 2017-06-14 DIAGNOSIS — F331 Major depressive disorder, recurrent, moderate: Secondary | ICD-10-CM | POA: Diagnosis not present

## 2017-10-13 ENCOUNTER — Ambulatory Visit (INDEPENDENT_AMBULATORY_CARE_PROVIDER_SITE_OTHER): Payer: PRIVATE HEALTH INSURANCE | Admitting: Psychology

## 2017-10-13 DIAGNOSIS — F331 Major depressive disorder, recurrent, moderate: Secondary | ICD-10-CM | POA: Diagnosis not present

## 2017-12-08 ENCOUNTER — Ambulatory Visit (INDEPENDENT_AMBULATORY_CARE_PROVIDER_SITE_OTHER): Payer: PRIVATE HEALTH INSURANCE | Admitting: Psychology

## 2017-12-08 DIAGNOSIS — F330 Major depressive disorder, recurrent, mild: Secondary | ICD-10-CM | POA: Diagnosis not present

## 2017-12-19 ENCOUNTER — Ambulatory Visit (INDEPENDENT_AMBULATORY_CARE_PROVIDER_SITE_OTHER): Payer: PRIVATE HEALTH INSURANCE | Admitting: Advanced Practice Midwife

## 2017-12-19 ENCOUNTER — Encounter: Payer: Self-pay | Admitting: Advanced Practice Midwife

## 2017-12-19 VITALS — BP 132/70 | HR 54 | Ht 64.0 in | Wt 185.1 lb

## 2017-12-19 DIAGNOSIS — R87610 Atypical squamous cells of undetermined significance on cytologic smear of cervix (ASC-US): Secondary | ICD-10-CM

## 2017-12-19 DIAGNOSIS — N939 Abnormal uterine and vaginal bleeding, unspecified: Secondary | ICD-10-CM

## 2017-12-19 DIAGNOSIS — L709 Acne, unspecified: Secondary | ICD-10-CM | POA: Diagnosis not present

## 2017-12-19 DIAGNOSIS — E288 Other ovarian dysfunction: Secondary | ICD-10-CM | POA: Diagnosis not present

## 2017-12-19 DIAGNOSIS — G43009 Migraine without aura, not intractable, without status migrainosus: Secondary | ICD-10-CM | POA: Diagnosis not present

## 2017-12-19 DIAGNOSIS — R635 Abnormal weight gain: Secondary | ICD-10-CM

## 2017-12-19 DIAGNOSIS — Z975 Presence of (intrauterine) contraceptive device: Secondary | ICD-10-CM

## 2017-12-19 DIAGNOSIS — R87619 Unspecified abnormal cytological findings in specimens from cervix uteri: Secondary | ICD-10-CM | POA: Insufficient documentation

## 2017-12-19 MED ORDER — IBUPROFEN 600 MG PO TABS: 600.0000 mg | ORAL_TABLET | Freq: Four times a day (QID) | ORAL | 0 refills | Status: AC | PRN

## 2017-12-19 MED ORDER — SUMATRIPTAN SUCCINATE 100 MG PO TABS: 100.0000 mg | ORAL_TABLET | Freq: Once | ORAL | 1 refills | Status: AC | PRN

## 2017-12-19 MED ORDER — CLINDAMYCIN PHOS-BENZOYL PEROX 1-5 % EX GEL: Freq: Two times a day (BID) | CUTANEOUS | 0 refills | Status: DC

## 2017-12-19 NOTE — Progress Notes (Signed)
Pt has nexplanon (inserted 1/18) for contraception- is here for c/o weight gain and heightened emotions, cramps, migraines, acne. Went to PCP to get checked for pregnancy, UPT was neg per patient. Pt does not get menstrual cycle. Pt has had two previous IUD's (Skyla and Mirena) both IUD's came out on their own.

## 2017-12-19 NOTE — Progress Notes (Signed)
  GYNECOLOGY PROGRESS NOTE  History:  22 y.o. G0P0000 presents to [facility] office today for problem gyn visit. She has Nexplanon placed 03/04/16. Prior to Nexplanon, she tried OCPs with irregular bleeding, weight gain, and acne and had IUD x 2, the first was malpositioned and removed, the second one was expelled during pt menses.  She is happy with LARC for birth control and is concerned about alternative choices including OCPs but is having acne, hair growth on her chin, migraines 2 x per month. She does not have periods with her Nexplanon.  Prior to hormonal contraception, she had 3-4 periods per year only.  She has strong family hx of PCOS.  She does not desire pregnancy at this time and would like to try to treat symptoms, keep the Nexplanon in, and test to make sure her symptoms are not something else.  She denies h/a, dizziness, shortness of breath, n/v, or fever/chills.    The following portions of the patient's history were reviewed and updated as appropriate: allergies, current medications, past family history, past medical history, past social history, past surgical history and problem list. Last pap smear on 04/29/17 was abnormal with ASCUS and positive HR HPV with plan to repeat in 1 year.  Review of Systems:  Pertinent items are noted in HPI.   Objective:  Physical Exam Blood pressure 132/70, pulse (!) 54, height 5\' 4"  (1.626 m), weight 84 kg. VS reviewed, nursing note reviewed,  Constitutional: well developed, well nourished, no distress HEENT: normocephalic CV: normal rate Pulm/chest wall: normal effort Breast Exam: deferred Abdomen: soft Neuro: alert and oriented x 3 Skin: warm, dry Psych: affect normal Pelvic exam: Deferred  Assessment & Plan:  1. Hyperandrogenism --Pt with hormonal contraption which may alter results but symptoms of PCOS and underlying history of symptoms prior to contraception.  Will do baseline labs and [place] for PCOS but leave Nexplanon in for now. - TSH -  Follicle stimulating hormone - Prolactin - 17-Hydroxyprogesterone - Testosterone  2. Abnormal uterine bleeding (AUB) --Infrequent bleeding prior to contraception, frequent heavy bleeding with IUDs and OCPs, amenorrheic with Nexplanon. - US PELVIC COMPLETE WITH TRANSVAGINAL; Future  3. Migraine without aura and without status migrainosus, not intractable --With migraine 1-2 times per month, will treat with preventive medications.  Try ibuprofen or imitrex at first onset of symptoms.   - SUMAtriptan (IMITREX) 100 MG tablet; Take 1 tablet (100 mg total) by mouth once as needed for up to 1 dose for migraine. May repeat in 2 hours if headache persists or recurs.  Dispense: 9 tablet; Refill: 1 - ibuprofen (ADVIL,MOTRIN) 600 MG tablet; Take 1 tablet (600 mg total) by mouth every 6 (six) hours as needed for headache.  Dispense: 30 tablet; Refill: 0  4. Acne, unspecified acne type --Try topical treatment, recommend pt see dermatology. - clindamycin-benzoyl peroxide (BENZACLIN) gel; Apply topically 2 (two) times daily.  Dispense: 25 g; Refill: 0  5.  Weight gain --Pt exercising but still gaining weight --See above plan   Sharen Counter, CNM 12:00 PM

## 2017-12-21 LAB — PROLACTIN: PROLACTIN: 9.4 ng/mL (ref 4.8–23.3)

## 2017-12-21 LAB — 17-HYDROXYPROGESTERONE: 17-Hydroxyprogesterone: 66 ng/dL

## 2017-12-21 LAB — TSH: TSH: 2.02 u[IU]/mL (ref 0.450–4.500)

## 2017-12-21 LAB — TESTOSTERONE: TESTOSTERONE: 31 ng/dL (ref 8–48)

## 2017-12-21 LAB — FOLLICLE STIMULATING HORMONE: FSH: 5.8 m[IU]/mL

## 2018-01-30 ENCOUNTER — Ambulatory Visit (HOSPITAL_COMMUNITY): Payer: PRIVATE HEALTH INSURANCE | Attending: Advanced Practice Midwife

## 2018-03-20 ENCOUNTER — Ambulatory Visit (INDEPENDENT_AMBULATORY_CARE_PROVIDER_SITE_OTHER): Payer: PRIVATE HEALTH INSURANCE | Admitting: Advanced Practice Midwife

## 2018-03-20 ENCOUNTER — Encounter: Payer: Self-pay | Admitting: Advanced Practice Midwife

## 2018-03-20 ENCOUNTER — Ambulatory Visit (INDEPENDENT_AMBULATORY_CARE_PROVIDER_SITE_OTHER): Payer: PRIVATE HEALTH INSURANCE | Admitting: Psychology

## 2018-03-20 VITALS — BP 126/73 | HR 61 | Ht 64.0 in | Wt 187.0 lb

## 2018-03-20 DIAGNOSIS — N83202 Unspecified ovarian cyst, left side: Secondary | ICD-10-CM | POA: Diagnosis not present

## 2018-03-20 DIAGNOSIS — F331 Major depressive disorder, recurrent, moderate: Secondary | ICD-10-CM

## 2018-03-20 DIAGNOSIS — E282 Polycystic ovarian syndrome: Secondary | ICD-10-CM | POA: Diagnosis not present

## 2018-03-20 DIAGNOSIS — E288 Other ovarian dysfunction: Secondary | ICD-10-CM | POA: Diagnosis not present

## 2018-03-20 NOTE — Progress Notes (Addendum)
  GYNECOLOGY PROGRESS NOTE  History:  23 y.o. G0P0000 presents to [facility] office today for problem gyn visit. She reports LLQ pain that is intermittent cramping and sharp pain x 1 year. She reported symptoms to her PCP who referred her to another OB/Gyn. She had an US and received Dx of PCOS and Rx for Nuvaring. Pt has lots of sensitivities to contraception and is not sure she wants to start another medication. She currently has Nexplanon in place and reports some weight gain and migraines 2-3 times per month, but is otherwise happy with the method.  She returns to our office for second opinion about PCOS treatment and to reestablish gyn care with our office per her preference.  She denies h/a, dizziness, shortness of breath, n/v, or fever/chills.    The following portions of the patient's history were reviewed and updated as appropriate: allergies, current medications, past family history, past medical history, past social history, past surgical history and problem list. Last pap smear on 04/19/17 was abnormal with ASCUS, positive HRHPV.  Pap recommended in 1 year per ASCCP guidelines.  Review of Systems:  Pertinent items are noted in HPI.   Objective:  Physical Exam Blood pressure 126/73, pulse 61, height 5\' 4"  (1.626 m), weight 84.8 kg. VS reviewed, nursing note reviewed,  Constitutional: well developed, well nourished, no distress HEENT: normocephalic CV: normal rate Pulm/chest wall: normal effort Breast Exam: deferred Abdomen: soft Neuro: alert and oriented x 3 Skin: warm, dry Psych: affect normal Pelvic exam: Cervix pink, visually closed, without lesion, scant white creamy discharge, vaginal walls and external genitalia normal Bimanual exam: Cervix 0/long/high, firm, anterior, neg CMT, uterus nontender, nonenlarged, adnexa without tenderness, enlargement, or mass  Assessment & Plan:  1. PCOS (polycystic ovarian syndrome) --Pt with symptoms of hyperandrogenism, acne,  hirsuitism. --Discussed options with pt including expectant management, taking Nuvaring with Nexplanon in place, and removing Nexplanon. --Pt will try Nuvaring x 1 month, request records of her recent US be sent to our office, and return in 1 month for Pap and f/u on PCOS symptoms.  2. Left ovarian cyst --Pt was told the cyst was too small and may not be related to her pain but was on the left side where her pain occurs.  Request US records to review. --F/u in 1-2 months   3. 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  Sharen CounterLisa Leftwich-Kirby, CNM 12:39 PM

## 2018-04-17 ENCOUNTER — Encounter: Payer: Self-pay | Admitting: Obstetrics

## 2018-04-17 ENCOUNTER — Other Ambulatory Visit: Payer: Self-pay

## 2018-04-17 ENCOUNTER — Encounter: Payer: Self-pay | Admitting: Advanced Practice Midwife

## 2018-04-17 ENCOUNTER — Ambulatory Visit (INDEPENDENT_AMBULATORY_CARE_PROVIDER_SITE_OTHER): Payer: PRIVATE HEALTH INSURANCE | Admitting: Advanced Practice Midwife

## 2018-04-17 VITALS — BP 135/85 | HR 75 | Ht 64.0 in | Wt 185.0 lb

## 2018-04-17 DIAGNOSIS — N939 Abnormal uterine and vaginal bleeding, unspecified: Secondary | ICD-10-CM

## 2018-04-17 DIAGNOSIS — G43009 Migraine without aura, not intractable, without status migrainosus: Secondary | ICD-10-CM | POA: Diagnosis not present

## 2018-04-17 DIAGNOSIS — E282 Polycystic ovarian syndrome: Secondary | ICD-10-CM | POA: Diagnosis not present

## 2018-04-17 DIAGNOSIS — R102 Pelvic and perineal pain unspecified side: Secondary | ICD-10-CM

## 2018-04-17 DIAGNOSIS — Z01419 Encounter for gynecological examination (general) (routine) without abnormal findings: Secondary | ICD-10-CM

## 2018-04-17 DIAGNOSIS — R87610 Atypical squamous cells of undetermined significance on cytologic smear of cervix (ASC-US): Secondary | ICD-10-CM

## 2018-04-17 DIAGNOSIS — G8929 Other chronic pain: Secondary | ICD-10-CM

## 2018-04-17 DIAGNOSIS — Z3046 Encounter for surveillance of implantable subdermal contraceptive: Secondary | ICD-10-CM | POA: Diagnosis not present

## 2018-04-17 DIAGNOSIS — Z124 Encounter for screening for malignant neoplasm of cervix: Secondary | ICD-10-CM | POA: Diagnosis not present

## 2018-04-17 NOTE — Patient Instructions (Signed)
Diet for Polycystic Ovary Syndrome  Polycystic ovary syndrome (PCOS) is a disorder of the chemicals (hormones) that regulate a woman's reproductive system, including monthly periods (menstruation). The condition causes important hormones to be out of balance. PCOS can:  · Stop your periods or make them irregular.  · Cause cysts to develop on your ovaries.  · Make it difficult to get pregnant.  · Stop your body from responding to the effects of insulin (insulin resistance). Insulin resistance can lead to obesity and diabetes.  Changing what you eat can help you manage PCOS and improve your health. Following a balanced diet can help you lose weight and improve the way that your body uses insulin.  What are tips for following this plan?  · Follow a balanced diet for meals and snacks. Eat breakfast, lunch, dinner, and one or two snacks every day.  · Include protein in each meal and snack.  · Choose whole grains instead of products that are made with refined flour.  · Eat a variety of foods.  · Exercise regularly as told by your health care provider. Aim to do 30 or more minutes of exercise on most days of the week.  · If you are overweight or obese:  ? Pay attention to how many calories you eat. Cutting down on calories can help you lose weight.  ? Work with your health care provider or a diet and nutrition specialist (dietitian) to figure out how many calories you need each day.  What foods can I eat?    Fruits  Include a variety of colors and types. All fruits are helpful for PCOS.  Vegetables  Include a variety of colors and types. All vegetables are helpful for PCOS.  Grains  Whole grains, such as whole wheat. Whole-grain breads, crackers, cereals, and pasta. Unsweetened oatmeal, bulgur, barley, quinoa, and brown rice. Tortillas made from corn or whole-wheat flour.  Meats and other proteins  Low-fat (lean) proteins, such as fish, chicken, beans, eggs, and tofu.  Dairy  Low-fat dairy products, such as skim milk,  cheese sticks, and yogurt.  Beverages  Low-fat or fat-free drinks, such as water, low-fat milk, sugar-free drinks, and small amounts of 100% fruit juice.  Seasonings and condiments  Ketchup. Mustard. Barbecue sauce. Relish. Low-fat or fat-free mayonnaise.  Fats and oils  Olive oil or canola oil. Walnuts and almonds.  The items listed above may not be a complete list of recommended foods and beverages. Contact a dietitian for more options.  What foods are not recommended?  Foods that are high in calories or fat. Fried foods. Sweets. Products that are made from refined white flour, including white bread, pastries, white rice, and pasta.  The items listed above may not be a complete list of foods and beverages to avoid. Contact a dietitian for more information.  Summary  · PCOS is a hormonal imbalance that affects a woman's reproductive system.  · You can help to manage your PCOS by exercising regularly and eating a healthy, varied diet of vegetables, fruit, whole grains, low-fat (lean) protein, and low-fat dairy products.  · Changing what you eat can improve the way that your body uses insulin, help your hormones reach normal levels, and help you lose weight.  This information is not intended to replace advice given to you by your health care provider. Make sure you discuss any questions you have with your health care provider.  Document Released: 05/26/2015 Document Revised: 12/06/2016 Document Reviewed: 12/06/2016  Elsevier Interactive Patient   Education © 2019 [employer]

## 2018-04-17 NOTE — Progress Notes (Signed)
GYNECOLOGY PROGRESS NOTE  History:  23 y.o. G0P0000 presents to [facility] office today for follow up gyn visit.  She has PCOS with symptoms and has failed trials of estrogen containing contraceptives as well as progesterone only contraceptives. She was seen 12/19/17 and 03/20/18 for abnormal uterine bleeding/breakthrough bleeding with Nexplanon, hyperandrogen symptoms like hirsuitism and acne, and abdominal pain.  She has Nexplanon in place and used Nuvaring secondary to reduce bleeding, as prescribed by her PCP. She had Nuvaring in 1.5 weeks and reports emotional swings, foggy headed symptoms and severe headaches with Nuvaring.  She spoke with her behavioral health counselor and took the Nuvaring out on 2/20. Headaches and emotional swings improved but bleeding and cramping worsened with onset of menses 2/24 lasting until now, but bleeding is becoming lighter. Pt has hx of sensitivity and multiple side effects with hormonal contraceptives. She had 2 IUDs, one was expelled, the other was malpositioned and removed. She has h/a with her Nexplanon and frequent light bleeding but no emotional symptoms.  She has abdominal cramping and pain intermittently but it has worsened since taking the Nuvaring out on 2/20.  She is taking ibuprofen but it does not help.   The following portions of the patient's history were reviewed and updated as appropriate: allergies, current medications, past family history, past medical history, past social history, past surgical history and problem list. Last pap smear on 04/19/2017 was abnormal with ASCUS so repeated today.  Review of Systems:  Pertinent items are noted in HPI.   Objective:  Physical Exam Blood pressure 135/85, pulse 75, height 5\' 4"  (1.626 m), weight 83.9 kg, last menstrual period 04/10/2018. VS reviewed, nursing note reviewed,  Constitutional: well developed, well nourished, no distress HEENT: normocephalic CV: normal rate Pulm/chest wall: normal effort Breast  Exam: deferred Abdomen: soft Neuro: alert and oriented x 3 Skin: warm, dry Psych: affect normal Pelvic exam: Cervix pink, visually closed, without lesion, scant white creamy discharge, vaginal walls and external genitalia normal Bimanual exam: Cervix 0/long/high, firm, anterior, neg CMT, uterus nontender, nonenlarged, adnexa without tenderness, enlargement, or mass   Nexplanon Removal Patient identified, informed consent performed, consent signed.   Appropriate time out taken. Nexplanon site identified.  Area prepped in usual sterile fashon. One ml of 1% lidocaine was used to anesthetize the area at the distal end of the implant. A small stab incision was made right beside the implant on the distal portion.  The Nexplanon rod was grasped using hemostats and removed without difficulty.  There was minimal blood loss. There were no complications.  3 ml of 1% lidocaine was injected around the incision for post-procedure analgesia.  Steri-strips were applied over the small incision.  A pressure bandage was applied to reduce any bruising.  The patient tolerated the procedure well and was given post procedure instructions.  Patient is planning to use condoms for contraception.      Lynn Gonzales, CNM 5:06 PM Assessment & Plan:  1. PCOS (polycystic ovarian syndrome) --Pt with AUB, hirsuitism related to PCOS but unable to tolerate hormonal contraception. --Pt failed trial of OCPs/Nuvaring with severe side effects. --Pt desires contraception and Nexplanon in place so Metformin discussed but not started today.    2. Chronic pelvic pain in female --Pain not well explained by PCOS. Consider endometriosis or other causes of chronic pain. Pt to f/u with MD in 3 months for management of pain if persists after Nexplanon out.  3. Abnormal uterine bleeding (AUB) --Abnormal bleeding explained by PCOS and  Nexplanon for contraception.  --Discuss all treatment options including continuing Nexplanon,  changing to another method like IUD, trying other OCPs to see if she tolerates, or taking Nexplanon out temporarily.  Pt would like to do a trial of no hormones and follow up. She will use condoms for contraception for now.  4. Migraine without aura and without status migrainosus, not intractable --Migraines worse with all contraceptive methods so will see if improved after Nexplanon out.  5.  Nexplanon removal --Pt tolerated well. See above procedure note.  6.  Screening for cervical cancer --Pap today due to ASCUS in 2019  Lynn Gonzales, CNM 5:06 PM

## 2018-04-20 LAB — CYTOLOGY - PAP

## 2018-05-02 ENCOUNTER — Ambulatory Visit (INDEPENDENT_AMBULATORY_CARE_PROVIDER_SITE_OTHER): Payer: PRIVATE HEALTH INSURANCE | Admitting: Psychology

## 2018-05-02 DIAGNOSIS — F331 Major depressive disorder, recurrent, moderate: Secondary | ICD-10-CM

## 2018-05-05 ENCOUNTER — Telehealth: Payer: Self-pay

## 2018-05-05 NOTE — Telephone Encounter (Signed)
S/w pt and advised of results and need for colpo. Advised scheduler will call for appt. ?

## 2018-06-06 ENCOUNTER — Ambulatory Visit (INDEPENDENT_AMBULATORY_CARE_PROVIDER_SITE_OTHER): Payer: PRIVATE HEALTH INSURANCE | Admitting: Psychology

## 2018-06-06 DIAGNOSIS — F331 Major depressive disorder, recurrent, moderate: Secondary | ICD-10-CM | POA: Diagnosis not present

## 2018-06-22 ENCOUNTER — Ambulatory Visit (INDEPENDENT_AMBULATORY_CARE_PROVIDER_SITE_OTHER): Payer: PRIVATE HEALTH INSURANCE | Admitting: Psychology

## 2018-06-22 DIAGNOSIS — F331 Major depressive disorder, recurrent, moderate: Secondary | ICD-10-CM | POA: Diagnosis not present

## 2018-07-04 ENCOUNTER — Other Ambulatory Visit: Payer: Self-pay | Admitting: Nurse Practitioner

## 2018-07-06 ENCOUNTER — Ambulatory Visit (INDEPENDENT_AMBULATORY_CARE_PROVIDER_SITE_OTHER): Payer: PRIVATE HEALTH INSURANCE | Admitting: Psychology

## 2018-07-06 DIAGNOSIS — F331 Major depressive disorder, recurrent, moderate: Secondary | ICD-10-CM

## 2018-07-19 ENCOUNTER — Ambulatory Visit (INDEPENDENT_AMBULATORY_CARE_PROVIDER_SITE_OTHER): Payer: PRIVATE HEALTH INSURANCE | Admitting: Psychology

## 2018-07-19 DIAGNOSIS — F331 Major depressive disorder, recurrent, moderate: Secondary | ICD-10-CM

## 2018-07-26 ENCOUNTER — Ambulatory Visit (INDEPENDENT_AMBULATORY_CARE_PROVIDER_SITE_OTHER): Payer: PRIVATE HEALTH INSURANCE | Admitting: Psychology

## 2018-07-26 DIAGNOSIS — F331 Major depressive disorder, recurrent, moderate: Secondary | ICD-10-CM | POA: Diagnosis not present

## 2018-08-02 ENCOUNTER — Ambulatory Visit: Payer: PRIVATE HEALTH INSURANCE | Admitting: Psychology

## 2018-08-15 ENCOUNTER — Ambulatory Visit: Payer: PRIVATE HEALTH INSURANCE | Admitting: Psychology

## 2018-08-17 IMAGING — US US PELVIS COMPLETE TRANSABD/TRANSVAG
1 series · 13 of 25 positions shown · non-contrast
Comparison: Pelvis ultrasound 10/28/2015.

CLINICAL DATA: 22-year-old female with left lower quadrant/pelvic
pain for 2 months. Family history of polycystic ovarian syndrome.



[Series 1: us pelvis complete transabd/transvag · 0.23mm/px · 13 of 45 slices shown]
[im 1/45]
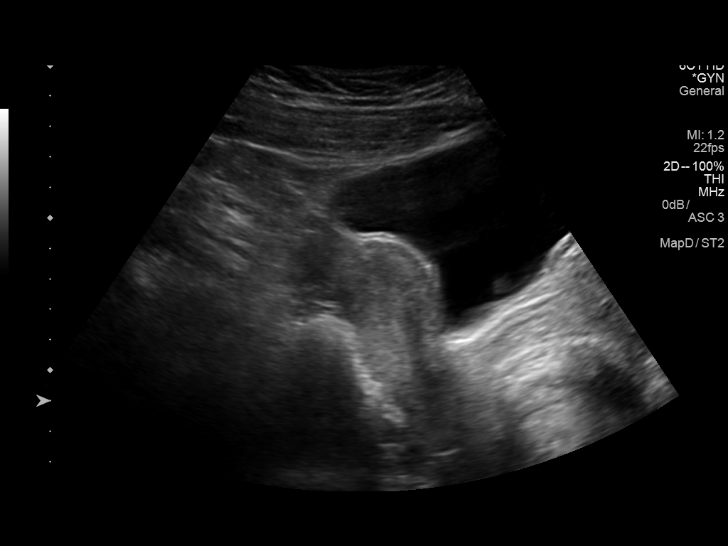
[im 4/45]
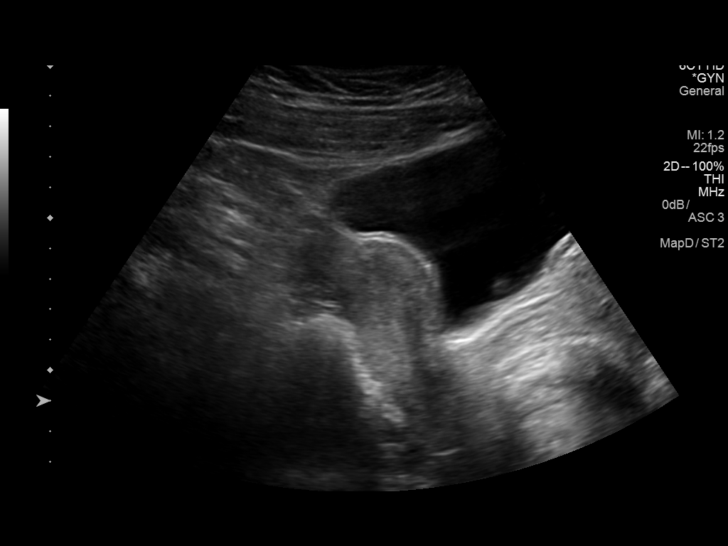
[im 8/45]
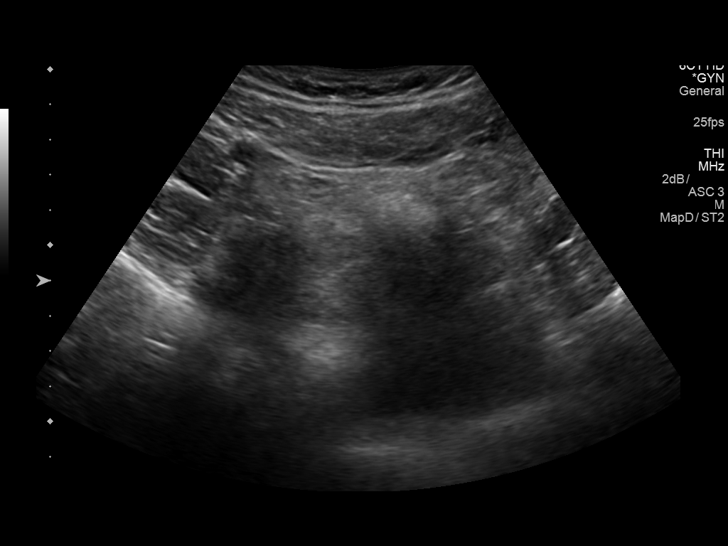
[im 12/45]
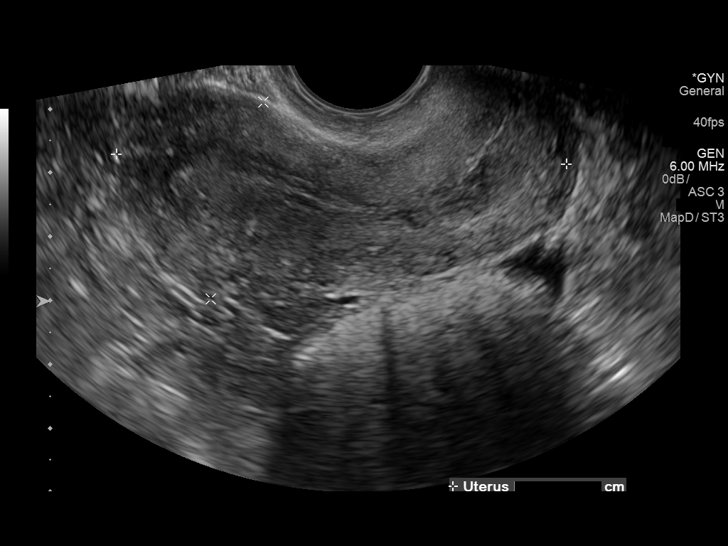
[im 15/45]
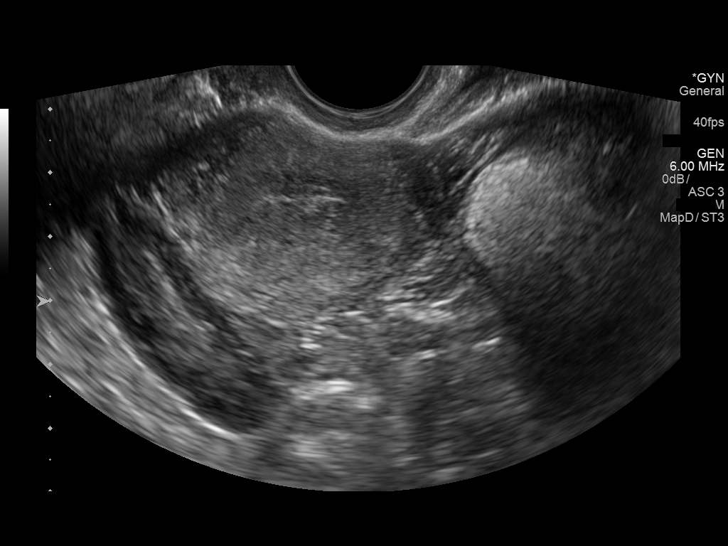
[im 19/45]
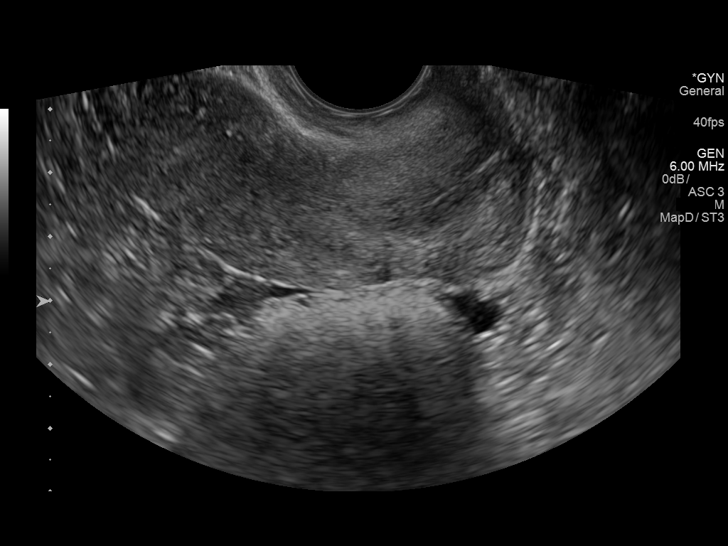
[im 23/45]
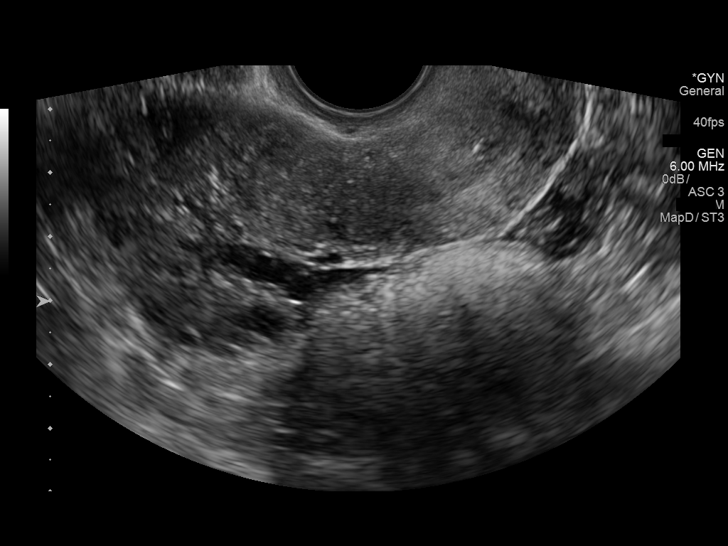
[im 26/45]
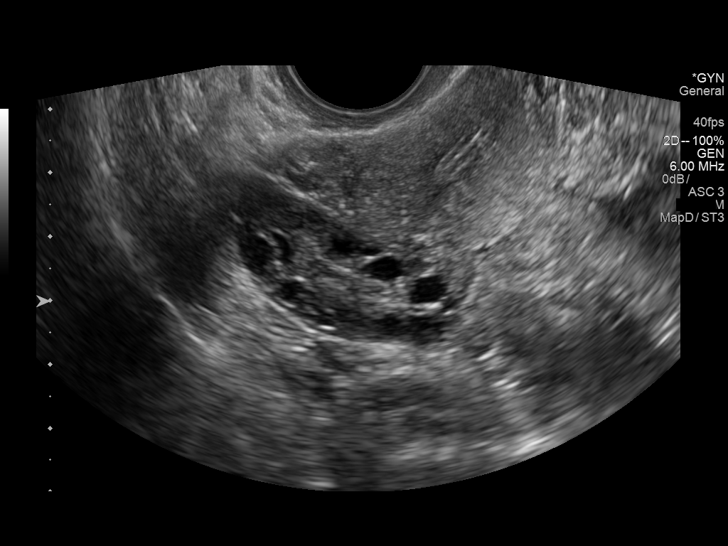
[im 30/45]
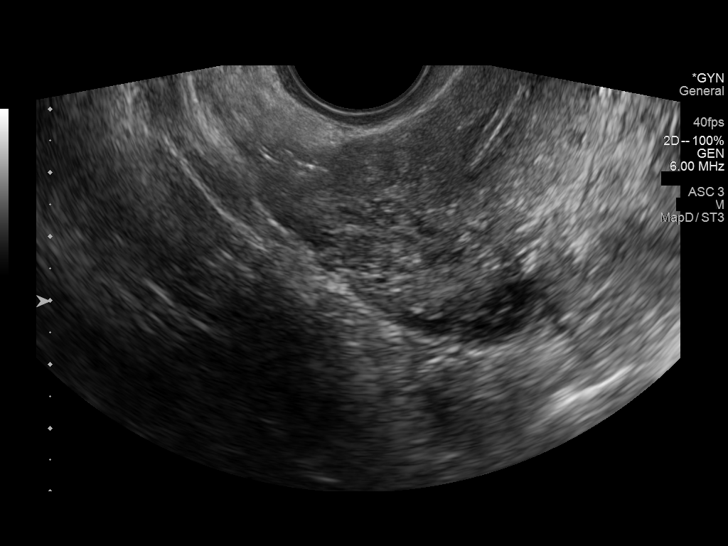
[im 34/45]
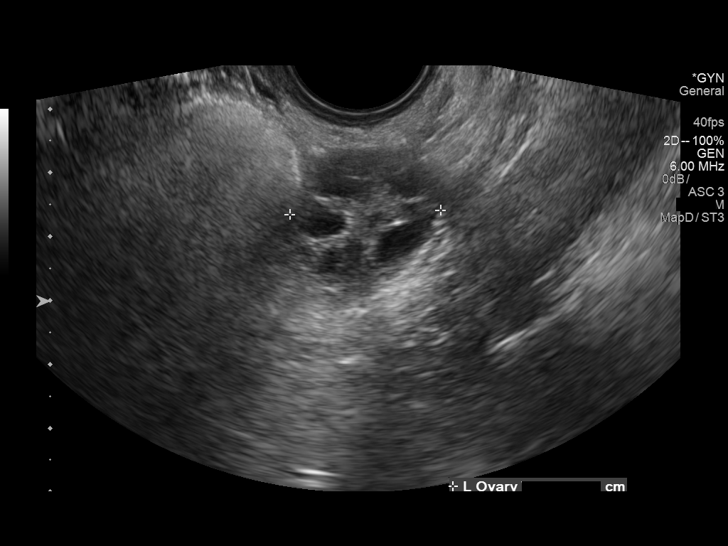
[im 37/45]
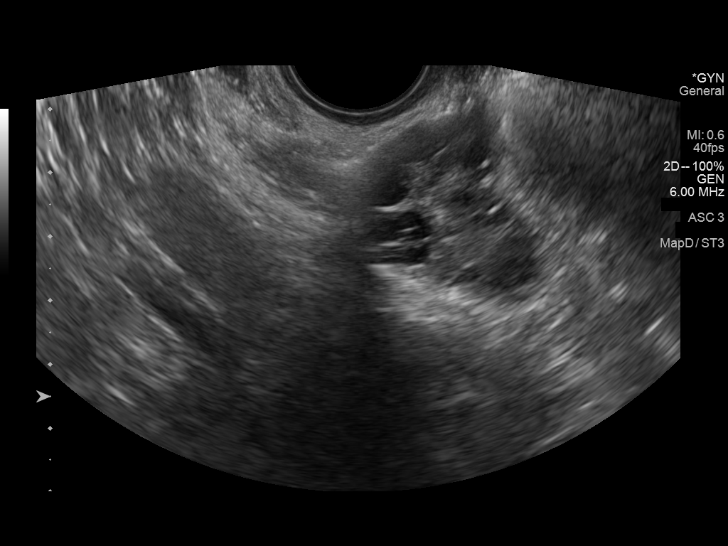
[im 41/45]
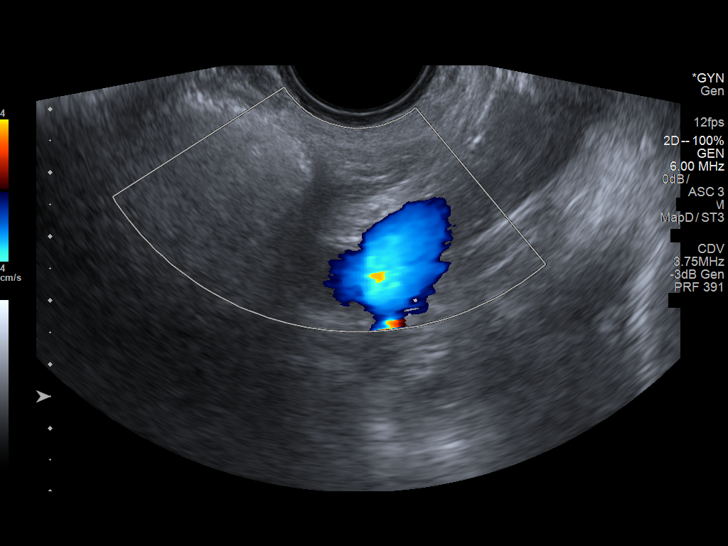
[im 45/45]
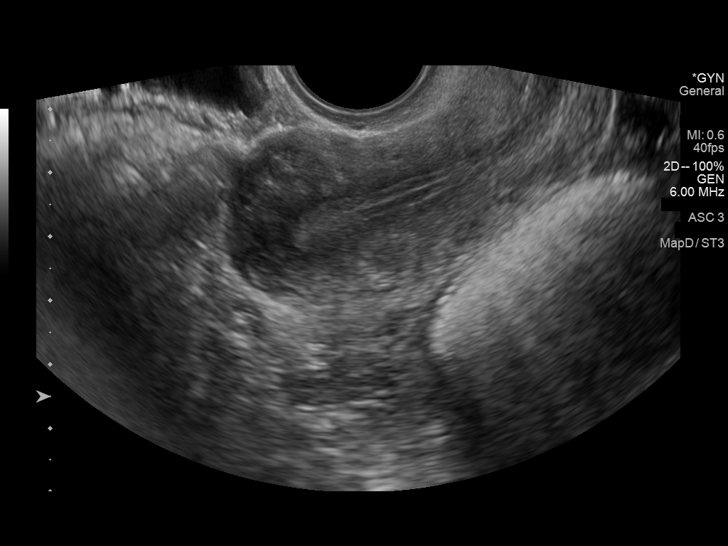

[13 of 25 positions shown; findings below may reference images not displayed]

FINDINGS: Uterus

Measurements: 7.1 x 3.2 x 4.3 centimeters. No fibroids or other mass
visualized.

Endometrium

Thickness: 5 millimeters. IUD no longer visible.. No focal
abnormality visualized.

Right ovary

Measurements: 3.8 x 1.6 x 3.2 centimeters. Multiple small follicles
(image 28) similar to but mildly regressed compared to the 6552
ultrasound..

Left ovary

Measurements: 3.2 x 3.4 x 2.3 centimeters.. Multiple small
follicles, also stable to decreased in number compared to the 6552
ultrasound.

Other findings

Trace simple appearing pelvic free fluid (image 20).

Abundant shadowing is noted in the left pelvis (image 41) and it is
unclear whether this is within gas distended distal bowel, or
related to increased focal fat in the pelvis.
IMPRESSION: 1. Large area of acoustic shadowing in the pelvis of unclear
etiology and significance. This might be related to gas distended
distal bowel (favored), but a fat containing pelvic mass is
difficult to exclude. Recommend follow-up CT Abdomen and Pelvis
(oral and IV contrast preferred).
2. Normal ovaries and uterus;
- multiple small follicles are present in both ovaries which can be
seen in the setting of PCOS. The appearance is stable to regressed
since 6552.
- trace pelvic free fluid appears physiologic.

## 2018-08-30 ENCOUNTER — Ambulatory Visit (INDEPENDENT_AMBULATORY_CARE_PROVIDER_SITE_OTHER): Payer: PRIVATE HEALTH INSURANCE | Admitting: Psychology

## 2018-08-30 DIAGNOSIS — F331 Major depressive disorder, recurrent, moderate: Secondary | ICD-10-CM | POA: Diagnosis not present

## 2018-09-14 ENCOUNTER — Ambulatory Visit (INDEPENDENT_AMBULATORY_CARE_PROVIDER_SITE_OTHER): Payer: PRIVATE HEALTH INSURANCE | Admitting: Psychology

## 2018-09-14 DIAGNOSIS — F330 Major depressive disorder, recurrent, mild: Secondary | ICD-10-CM

## 2020-05-15 ENCOUNTER — Ambulatory Visit (INDEPENDENT_AMBULATORY_CARE_PROVIDER_SITE_OTHER): Payer: PRIVATE HEALTH INSURANCE | Admitting: Psychology

## 2020-05-15 DIAGNOSIS — F419 Anxiety disorder, unspecified: Secondary | ICD-10-CM

## 2020-05-22 ENCOUNTER — Ambulatory Visit (INDEPENDENT_AMBULATORY_CARE_PROVIDER_SITE_OTHER): Payer: PRIVATE HEALTH INSURANCE | Admitting: Psychology

## 2020-05-22 DIAGNOSIS — F419 Anxiety disorder, unspecified: Secondary | ICD-10-CM | POA: Diagnosis not present

## 2020-05-29 ENCOUNTER — Ambulatory Visit (INDEPENDENT_AMBULATORY_CARE_PROVIDER_SITE_OTHER): Payer: PRIVATE HEALTH INSURANCE | Admitting: Psychology

## 2020-05-29 DIAGNOSIS — F419 Anxiety disorder, unspecified: Secondary | ICD-10-CM

## 2020-06-05 ENCOUNTER — Ambulatory Visit (INDEPENDENT_AMBULATORY_CARE_PROVIDER_SITE_OTHER): Payer: PRIVATE HEALTH INSURANCE | Admitting: Psychology

## 2020-06-05 DIAGNOSIS — F419 Anxiety disorder, unspecified: Secondary | ICD-10-CM

## 2020-06-12 ENCOUNTER — Ambulatory Visit (INDEPENDENT_AMBULATORY_CARE_PROVIDER_SITE_OTHER): Payer: PRIVATE HEALTH INSURANCE | Admitting: Psychology

## 2020-06-12 DIAGNOSIS — F419 Anxiety disorder, unspecified: Secondary | ICD-10-CM

## 2020-06-19 ENCOUNTER — Ambulatory Visit (INDEPENDENT_AMBULATORY_CARE_PROVIDER_SITE_OTHER): Payer: PRIVATE HEALTH INSURANCE | Admitting: Psychology

## 2020-06-19 DIAGNOSIS — F325 Major depressive disorder, single episode, in full remission: Secondary | ICD-10-CM | POA: Diagnosis not present

## 2020-06-26 ENCOUNTER — Ambulatory Visit (INDEPENDENT_AMBULATORY_CARE_PROVIDER_SITE_OTHER): Payer: PRIVATE HEALTH INSURANCE | Admitting: Psychology

## 2020-06-26 DIAGNOSIS — F325 Major depressive disorder, single episode, in full remission: Secondary | ICD-10-CM

## 2020-07-03 ENCOUNTER — Ambulatory Visit (INDEPENDENT_AMBULATORY_CARE_PROVIDER_SITE_OTHER): Payer: PRIVATE HEALTH INSURANCE | Admitting: Psychology

## 2020-07-03 DIAGNOSIS — F325 Major depressive disorder, single episode, in full remission: Secondary | ICD-10-CM | POA: Diagnosis not present

## 2020-07-10 ENCOUNTER — Ambulatory Visit (INDEPENDENT_AMBULATORY_CARE_PROVIDER_SITE_OTHER): Payer: PRIVATE HEALTH INSURANCE | Admitting: Psychology

## 2020-07-10 DIAGNOSIS — F325 Major depressive disorder, single episode, in full remission: Secondary | ICD-10-CM | POA: Diagnosis not present

## 2020-07-17 ENCOUNTER — Ambulatory Visit (INDEPENDENT_AMBULATORY_CARE_PROVIDER_SITE_OTHER): Payer: PRIVATE HEALTH INSURANCE | Admitting: Psychology

## 2020-07-17 DIAGNOSIS — F325 Major depressive disorder, single episode, in full remission: Secondary | ICD-10-CM | POA: Diagnosis not present

## 2020-07-24 ENCOUNTER — Ambulatory Visit (INDEPENDENT_AMBULATORY_CARE_PROVIDER_SITE_OTHER): Payer: PRIVATE HEALTH INSURANCE | Admitting: Psychology

## 2020-07-24 DIAGNOSIS — F411 Generalized anxiety disorder: Secondary | ICD-10-CM | POA: Diagnosis not present

## 2020-07-31 ENCOUNTER — Ambulatory Visit: Payer: PRIVATE HEALTH INSURANCE | Admitting: Psychology

## 2020-08-07 ENCOUNTER — Ambulatory Visit (INDEPENDENT_AMBULATORY_CARE_PROVIDER_SITE_OTHER): Payer: PRIVATE HEALTH INSURANCE | Admitting: Psychology

## 2020-08-07 DIAGNOSIS — F325 Major depressive disorder, single episode, in full remission: Secondary | ICD-10-CM | POA: Diagnosis not present

## 2020-08-14 ENCOUNTER — Ambulatory Visit (INDEPENDENT_AMBULATORY_CARE_PROVIDER_SITE_OTHER): Payer: PRIVATE HEALTH INSURANCE | Admitting: Psychology

## 2020-08-14 DIAGNOSIS — F325 Major depressive disorder, single episode, in full remission: Secondary | ICD-10-CM | POA: Diagnosis not present

## 2020-08-28 ENCOUNTER — Ambulatory Visit (INDEPENDENT_AMBULATORY_CARE_PROVIDER_SITE_OTHER): Payer: PRIVATE HEALTH INSURANCE | Admitting: Psychology

## 2020-08-28 DIAGNOSIS — F330 Major depressive disorder, recurrent, mild: Secondary | ICD-10-CM | POA: Diagnosis not present

## 2020-09-04 ENCOUNTER — Ambulatory Visit (INDEPENDENT_AMBULATORY_CARE_PROVIDER_SITE_OTHER): Payer: PRIVATE HEALTH INSURANCE | Admitting: Psychology

## 2020-09-04 DIAGNOSIS — F330 Major depressive disorder, recurrent, mild: Secondary | ICD-10-CM

## 2020-09-10 DIAGNOSIS — F411 Generalized anxiety disorder: Secondary | ICD-10-CM

## 2020-09-11 ENCOUNTER — Ambulatory Visit (INDEPENDENT_AMBULATORY_CARE_PROVIDER_SITE_OTHER): Payer: PRIVATE HEALTH INSURANCE | Admitting: Psychology

## 2020-09-18 ENCOUNTER — Ambulatory Visit (INDEPENDENT_AMBULATORY_CARE_PROVIDER_SITE_OTHER): Payer: PRIVATE HEALTH INSURANCE | Admitting: Psychology

## 2020-09-18 DIAGNOSIS — F411 Generalized anxiety disorder: Secondary | ICD-10-CM

## 2020-09-25 ENCOUNTER — Ambulatory Visit (INDEPENDENT_AMBULATORY_CARE_PROVIDER_SITE_OTHER): Payer: BC Managed Care – PPO | Admitting: Psychology

## 2020-09-25 DIAGNOSIS — F411 Generalized anxiety disorder: Secondary | ICD-10-CM | POA: Diagnosis not present

## 2020-10-02 ENCOUNTER — Ambulatory Visit (INDEPENDENT_AMBULATORY_CARE_PROVIDER_SITE_OTHER): Payer: BC Managed Care – PPO | Admitting: Psychology

## 2020-10-02 DIAGNOSIS — F411 Generalized anxiety disorder: Secondary | ICD-10-CM | POA: Diagnosis not present

## 2020-10-09 ENCOUNTER — Ambulatory Visit (INDEPENDENT_AMBULATORY_CARE_PROVIDER_SITE_OTHER): Payer: BC Managed Care – PPO | Admitting: Psychology

## 2020-10-09 DIAGNOSIS — F411 Generalized anxiety disorder: Secondary | ICD-10-CM | POA: Diagnosis not present

## 2020-10-16 ENCOUNTER — Ambulatory Visit (INDEPENDENT_AMBULATORY_CARE_PROVIDER_SITE_OTHER): Payer: BC Managed Care – PPO | Admitting: Psychology

## 2020-10-16 DIAGNOSIS — F330 Major depressive disorder, recurrent, mild: Secondary | ICD-10-CM

## 2020-10-23 ENCOUNTER — Ambulatory Visit: Payer: PRIVATE HEALTH INSURANCE | Admitting: Psychology

## 2020-10-30 ENCOUNTER — Ambulatory Visit (INDEPENDENT_AMBULATORY_CARE_PROVIDER_SITE_OTHER): Payer: BC Managed Care – PPO | Admitting: Psychology

## 2020-10-30 DIAGNOSIS — F330 Major depressive disorder, recurrent, mild: Secondary | ICD-10-CM

## 2020-11-06 ENCOUNTER — Ambulatory Visit (INDEPENDENT_AMBULATORY_CARE_PROVIDER_SITE_OTHER): Payer: BC Managed Care – PPO | Admitting: Psychology

## 2020-11-06 DIAGNOSIS — F330 Major depressive disorder, recurrent, mild: Secondary | ICD-10-CM | POA: Diagnosis not present

## 2020-11-13 ENCOUNTER — Ambulatory Visit (INDEPENDENT_AMBULATORY_CARE_PROVIDER_SITE_OTHER): Payer: BC Managed Care – PPO | Admitting: Psychology

## 2020-11-13 DIAGNOSIS — F330 Major depressive disorder, recurrent, mild: Secondary | ICD-10-CM | POA: Diagnosis not present

## 2020-11-20 ENCOUNTER — Ambulatory Visit (INDEPENDENT_AMBULATORY_CARE_PROVIDER_SITE_OTHER): Payer: BC Managed Care – PPO | Admitting: Psychology

## 2020-11-20 DIAGNOSIS — F330 Major depressive disorder, recurrent, mild: Secondary | ICD-10-CM

## 2020-11-27 ENCOUNTER — Ambulatory Visit (INDEPENDENT_AMBULATORY_CARE_PROVIDER_SITE_OTHER): Payer: BC Managed Care – PPO | Admitting: Psychology

## 2020-11-27 DIAGNOSIS — F330 Major depressive disorder, recurrent, mild: Secondary | ICD-10-CM | POA: Diagnosis not present

## 2020-12-04 ENCOUNTER — Ambulatory Visit: Payer: PRIVATE HEALTH INSURANCE | Admitting: Psychology

## 2020-12-11 ENCOUNTER — Ambulatory Visit (INDEPENDENT_AMBULATORY_CARE_PROVIDER_SITE_OTHER): Payer: BC Managed Care – PPO | Admitting: Psychology

## 2020-12-11 DIAGNOSIS — F330 Major depressive disorder, recurrent, mild: Secondary | ICD-10-CM

## 2020-12-18 ENCOUNTER — Ambulatory Visit (INDEPENDENT_AMBULATORY_CARE_PROVIDER_SITE_OTHER): Payer: BC Managed Care – PPO | Admitting: Psychology

## 2020-12-18 DIAGNOSIS — F411 Generalized anxiety disorder: Secondary | ICD-10-CM

## 2020-12-25 ENCOUNTER — Ambulatory Visit (INDEPENDENT_AMBULATORY_CARE_PROVIDER_SITE_OTHER): Payer: BC Managed Care – PPO | Admitting: Psychology

## 2020-12-25 DIAGNOSIS — F330 Major depressive disorder, recurrent, mild: Secondary | ICD-10-CM

## 2021-01-01 ENCOUNTER — Ambulatory Visit (INDEPENDENT_AMBULATORY_CARE_PROVIDER_SITE_OTHER): Payer: BC Managed Care – PPO | Admitting: Psychology

## 2021-01-01 DIAGNOSIS — F330 Major depressive disorder, recurrent, mild: Secondary | ICD-10-CM

## 2021-01-15 ENCOUNTER — Ambulatory Visit (INDEPENDENT_AMBULATORY_CARE_PROVIDER_SITE_OTHER): Payer: BC Managed Care – PPO | Admitting: Psychology

## 2021-01-15 DIAGNOSIS — F330 Major depressive disorder, recurrent, mild: Secondary | ICD-10-CM | POA: Diagnosis not present

## 2021-01-22 ENCOUNTER — Ambulatory Visit (INDEPENDENT_AMBULATORY_CARE_PROVIDER_SITE_OTHER): Payer: BC Managed Care – PPO | Admitting: Psychology

## 2021-01-22 DIAGNOSIS — F330 Major depressive disorder, recurrent, mild: Secondary | ICD-10-CM

## 2021-01-22 NOTE — Progress Notes (Addendum)
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 (Status: maintained) -- Skipping meals due to loss of appetite, or busy schedule  Depressed or irritable mood. (Status: maintained) -- Sad affect most of the time, easily losses patience  Diminished interest in or enjoyment of activities. (Status: maintained) -- Difficulties with motivation for normal everyday activities  Feelings of hopelessness, worthlessness, or inappropriate guilt. (Status: maintained) -- Familiy pressures making her feel guilty, disappointed in self  History of chronic or recurrent depression for which the client has taken antidepressant medication, been hospitalized, had outpatient treatment, or had a course of electroconvulsive therapy. (Status: maintained) -- Client and famiy describe long history of depression and previous suicide ideation and gestures  Lack of energy. (Status: maintained) -- Constant fatigue  Low self-esteem. (Status: maintained) -- No Description Entered  Poor concentration and indecisiveness. (Status: maintained) -- No Description Entered  Medication Status na  Safety none  If Suicidal or Homicidal State Action Taken: unspecified  Current Risk: low Medications unspecified Objectives Related Problem: Balance life activities between consideration of others and development of own interests. Description: Identify values that guide life's decisions and determine fulfillment. Target Date: 2021-06-19 Frequency: Weekly Modality: individual Progress: 20%  Related Problem: Balance life activities between consideration of others and development of own interests. Description: Implement new activities that increase a sense of satisfaction. Target Date: 2021-06-19 Frequency: Weekly Modality: individual Progress: 20%  Related Problem: Balance life  activities between consideration of others and development of own interests. Description: Implement increased assertiveness to take control of conflicts. Target Date: 2021-06-19 Frequency: Weekly Modality: individual Progress: 10%  Planned Intervention: Encourage the patient to use assertiveness skills and boundary setting application to the client's daily life.  Related Problem: Balance life activities between consideration of others and development of own interests. Description: Apply problem-solving skills to current circumstances. Target Date: 2021-06-19 Frequency: Weekly Modality: individual Progress: 10%  Related Problem: Balance life activities between consideration of others and development of own interests. Description: Increase communication with significant others regarding current life stress factors. Target Date: 2021-06-19 Frequency: Weekly Modality: individual Progress: 10%  Related Problem: Balance life activities between consideration of others and development of own interests. Description: Implement changes in time and effort allocation to restore balance to life. Target Date: 2021-06-19 Frequency: Weekly Modality: individual Progress: 0%  Related Problem: Balance life activities between consideration of others and development of own interests. Description: Increase social contacts to reduce sense of isolation. Target Date: 2021-06-19 Frequency: Weekly Modality: individual Progress: 0%  Related Problem: Balance life activities between consideration of others and development of own interests. Description: Share emotional struggles related to current adjustment stress. Target Date: 2021-06-19 Frequency: Weekly Modality: individual Progress: 60%  Related Problem: Balance life activities between consideration of others and development of own interests. Description: Significant others offer support to reduce the client's stress. Target Date:  2021-06-19 Frequency: Weekly Modality: individual Progress: 10%  Related Problem: Balance life activities between consideration of others and development of own interests. Description: Describe the circumstances of life that are contributing to stress, anxiety, or lack of fulfillment. Target Date: 2021-06-19 Frequency: Weekly Modality: individual Progress: 50%  Related Problem: Balance life activities between consideration of others and development of own interests. Description: Increase activities that reinforce a positive self-identity. Target Date: 2021-06-19 Frequency: Weekly Modality: individual Progress: 30%  Related Problem: Develop healthy interpersonal relationships that lead to  the alleviation and help prevent the relapse of depression. Description: Describe current and past experiences with depression including their impact on functioning and attempts to resolve it. Target Date: 2021-06-19 Frequency: Weekly Modality: individual Progress: 0%  Client Response full compliance  Service Location Location, [address] [place], [place] [address]  Service Code cpt [phone]  Clarify interpersonal incident (IPT)  Identify/label emotions  Validate/empathize   Communication analysis (IPT)  Rationally challenge thoughts or beliefs/cognitive restructuring  Identify automatic thoughts  Lifestyle change (exercise, nutrition)  Self care activities  Comments    Today I met with Lynn Gonzales in remote video (WebEx) face to face individual psychotherapy as l an accommodation during the pandemic.   Distance Site: Client's Home Originating Site: Dr Jannifer Franklin Remote Office Obtained consent to transmit remotely: Verbal  Pronouns: She/Her   Lynn Gonzales reports she has done a lot of traveling for work this past week. Lynn Gonzales  had a positive interaction with Lynn Gonzales's parent, Lynn Gonzales on the other hand had a big disappointment.  We d/e/p that his parents don't want to be there on his surgery  day.  Lynn Gonzales states that she has gotten a lot of push back from her mother on a number of issues.  We identified that her mother continues to project her negative thoughts and feelings for Lynn Gonzales onto her.  We d/ ways to communicate and clarify when her mother communicates with her in a veiled manner.    Lynn Gonzales, Ph.D.

## 2021-01-29 ENCOUNTER — Ambulatory Visit (INDEPENDENT_AMBULATORY_CARE_PROVIDER_SITE_OTHER): Payer: BC Managed Care – PPO | Admitting: Psychology

## 2021-01-29 DIAGNOSIS — F330 Major depressive disorder, recurrent, mild: Secondary | ICD-10-CM | POA: Diagnosis not present

## 2021-01-29 NOTE — Progress Notes (Signed)
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 (Status: maintained) -- Skipping meals due to loss of appetite, or busy schedule  Depressed or irritable mood. (Status: maintained) -- Sad affect most of the time, easily losses patience  Diminished interest in or enjoyment of activities. (Status: maintained) -- Difficulties with motivation for normal everyday activities  Feelings of hopelessness, worthlessness, or inappropriate guilt. (Status: maintained) -- Familiy pressures making her feel guilty, disappointed in self  History of chronic or recurrent depression for which the client has taken antidepressant medication, been hospitalized, had outpatient treatment, or had a course of electroconvulsive therapy. (Status: maintained) -- Client and famiy describe long history of depression and previous suicide ideation and gestures  Lack of energy. (Status: maintained) -- Constant fatigue  Low self-esteem. (Status: maintained) -- No Description Entered  Poor concentration and indecisiveness. (Status: maintained) -- No Description Entered  Medication Status na  Safety none  If Suicidal or Homicidal State Action Taken: unspecified  Current Risk: low Medications unspecified Objectives Related Problem: Balance life activities between consideration of others and development of own interests. Description: Identify values that guide life's decisions and determine fulfillment. Target Date: 2021-06-19 Frequency: Weekly Modality: individual Progress: 20%  Related Problem: Balance life activities between consideration of others and development of own interests. Description: Implement new activities that increase a sense of satisfaction. Target Date: 2021-06-19 Frequency: Weekly Modality: individual Progress: 20%  Related  Problem: Balance life activities between consideration of others and development of own interests. Description: Implement increased assertiveness to take control of conflicts. Target Date: 2021-06-19 Frequency: Weekly Modality: individual Progress: 10%  Planned Intervention: Encourage the patient to use assertiveness skills and boundary setting application to the client's daily life.  Related Problem: Balance life activities between consideration of others and development of own interests. Description: Apply problem-solving skills to current circumstances. Target Date: 2021-06-19 Frequency: Weekly Modality: individual Progress: 10%  Related Problem: Balance life activities between consideration of others and development of own interests. Description: Increase communication with significant others regarding current life stress factors. Target Date: 2021-06-19 Frequency: Weekly Modality: individual Progress: 10%  Related Problem: Balance life activities between consideration of others and development of own interests. Description: Implement changes in time and effort allocation to restore balance to life. Target Date: 2021-06-19 Frequency: Weekly Modality: individual Progress: 0%  Related Problem: Balance life activities between consideration of others and development of own interests. Description: Increase social contacts to reduce sense of isolation. Target Date: 2021-06-19 Frequency: Weekly Modality: individual Progress: 0%  Related Problem: Balance life activities between consideration of others and development of own interests. Description: Share emotional struggles related to current adjustment stress. Target Date: 2021-06-19 Frequency: Weekly Modality: individual Progress: 60%  Related Problem: Balance life activities between consideration of others and development of own interests. Description: Significant others offer support to reduce the client's stress. Target  Date: 2021-06-19 Frequency: Weekly Modality: individual Progress: 10%  Related Problem: Balance life activities between consideration of others and development of own interests. Description: Describe the circumstances of life that are contributing to stress, anxiety, or lack of fulfillment. Target Date: 2021-06-19 Frequency: Weekly Modality: individual Progress: 50%  Related Problem: Balance life activities between consideration of others and development of own interests. Description: Increase activities that reinforce a positive self-identity. Target Date: 2021-06-19 Frequency: Weekly Modality: individual Progress: 30%  Related Problem: Develop healthy interpersonal  relationships that lead to the alleviation and help prevent the relapse of depression. Description: Describe current and past experiences with depression including their impact on functioning and attempts to resolve it. Target Date: 2021-06-19 Frequency: Weekly Modality: individual Progress: 0%  Client Response full compliance  Service Location Location, [address] [place], [place] [address]  Service Code cpt [phone]  Clarify interpersonal incident (IPT)  Identify/label emotions  Validate/empathize   Communication analysis (IPT)  Rationally challenge thoughts or beliefs/cognitive restructuring  Identify automatic thoughts  Lifestyle change (exercise, nutrition)  Self care activities  Comments    Today I met with [name] in remote video (WebEx) face to face individual psychotherapy as l an accommodation during the pandemic.   Distance Site: Client's Home Originating Site: Dr Jannifer Franklin Remote Office Obtained consent to transmit remotely: Verbal  Pronouns: She/Her   Cleo reports that she had an issue with an employee which she needed to p/ in session.  There were multiple family issues around the holidays she was juggling which we d/e/p and then p/s together.  Doral continues to go to the gym  and is attempting to focus on her self care in the midst of other stressful happenings.  I provided support and guidance when necessary.   Progress: Verba continues to struggle with mild to moderate levels of depression, but she is managing to move forward and make independent decisions for her future.   [name], Ph.D.

## 2021-02-19 ENCOUNTER — Ambulatory Visit (INDEPENDENT_AMBULATORY_CARE_PROVIDER_SITE_OTHER): Payer: BC Managed Care – PPO | Admitting: Psychology

## 2021-02-19 DIAGNOSIS — F330 Major depressive disorder, recurrent, mild: Secondary | ICD-10-CM

## 2021-02-19 NOTE — Progress Notes (Signed)
Lynn Gonzales, Ph.D.

## 2021-02-19 NOTE — Progress Notes (Signed)
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 (Status: maintained) -- Skipping meals due to loss of appetite, or busy schedule  Depressed or irritable mood. (Status: maintained) -- Sad affect most of the time, easily losses patience  Diminished interest in or enjoyment of activities. (Status: maintained) -- Difficulties with motivation for normal everyday activities  Feelings of hopelessness, worthlessness, or inappropriate guilt. (Status: maintained) -- Familiy pressures making her feel guilty, disappointed in self  History of chronic or recurrent depression for which the client has taken antidepressant medication, been hospitalized, had outpatient treatment, or had a course of electroconvulsive therapy. (Status: maintained) -- Client and famiy describe long history of depression and previous suicide ideation and gestures  Lack of energy. (Status: maintained) -- Constant fatigue  Low self-esteem. (Status: maintained) -- No Description Entered  Poor concentration and indecisiveness. (Status: maintained) -- No Description Entered  Medication Status na  Safety none  If Suicidal or Homicidal State Action Taken: unspecified  Current Risk: low Medications unspecified Objectives Related Problem: Balance life activities between consideration of others and development of own interests. Description: Identify values that guide life's decisions and determine fulfillment. Target Date: 2021-06-19 Frequency: Weekly Modality: individual Progress: 20%  Related Problem: Balance life activities between consideration of others and development of own interests. Description: Implement new activities that increase a sense of satisfaction. Target Date: 2021-06-19 Frequency: Weekly Modality: individual Progress: 20%  Related  Problem: Balance life activities between consideration of others and development of own interests. Description: Implement increased assertiveness to take control of conflicts. Target Date: 2021-06-19 Frequency: Weekly Modality: individual Progress: 10%  Planned Intervention: Encourage the patient to use assertiveness skills and boundary setting application to the client's daily life.  Related Problem: Balance life activities between consideration of others and development of own interests. Description: Apply problem-solving skills to current circumstances. Target Date: 2021-06-19 Frequency: Weekly Modality: individual Progress: 10%  Related Problem: Balance life activities between consideration of others and development of own interests. Description: Increase communication with significant others regarding current life stress factors. Target Date: 2021-06-19 Frequency: Weekly Modality: individual Progress: 10%  Related Problem: Balance life activities between consideration of others and development of own interests. Description: Implement changes in time and effort allocation to restore balance to life. Target Date: 2021-06-19 Frequency: Weekly Modality: individual Progress: 0%  Related Problem: Balance life activities between consideration of others and development of own interests. Description: Increase social contacts to reduce sense of isolation. Target Date: 2021-06-19 Frequency: Weekly Modality: individual Progress: 0%  Related Problem: Balance life activities between consideration of others and development of own interests. Description: Share emotional struggles related to current adjustment stress. Target Date: 2021-06-19 Frequency: Weekly Modality: individual Progress: 60%  Related Problem: Balance life activities between consideration of others and development of own interests. Description: Significant others offer support to reduce the client's stress. Target  Date: 2021-06-19 Frequency: Weekly Modality: individual Progress: 10%  Related Problem: Balance life activities between consideration of others and development of own interests. Description: Describe the circumstances of life that are contributing to stress, anxiety, or lack of fulfillment. Target Date: 2021-06-19 Frequency: Weekly Modality: individual Progress: 50%  Related Problem: Balance life activities between consideration of others and development of own interests. Description: Increase activities that reinforce a positive self-identity. Target Date: 2021-06-19 Frequency: Weekly Modality: individual Progress: 30%  Related Problem: Develop healthy interpersonal  relationships that lead to the alleviation and help prevent the relapse of depression. Description: Describe current and past experiences with depression including their impact on functioning and attempts to resolve it. Target Date: 2021-06-19 Frequency: Weekly Modality: individual Progress: 0%  Client Response full compliance  Service Location Location, [address] [place], [place] [address]  Service Code cpt [phone] Clarify interpersonal incident (IPT)  Identify/label emotions  Validate/empathize   Communication analysis (IPT)  Rationally challenge thoughts or beliefs/cognitive restructuring  Identify automatic thoughts  Lifestyle change (exercise, nutrition)  Self care activities  Comments    Today I met with Lynn Gonzales in remote video (WebEx) face to face individual psychotherapy as l an accommodation during the pandemic.   Distance Site: Client's Home Originating Site: Dr Jannifer Franklin Remote Office Obtained consent to transmit remotely: Verbal  Pronouns: She/Her   Lynn Gonzales reports that the holidays were very draining.  All her sisters made it into town and it was exhausting.  [name] had his surgery, it was more "clean up" than expected but it all went well.  The family dynamic following the  the surgery were all together another story.  Lynn Gonzales and I d/e/p and p/s how to manage going forward.  I strongly encouraged Lynn Gonzales to consider her goals/intentions for direct communications with his and her parents and to manage her expectaions in advance.   Progress: Lynn Gonzales continues to struggle with mild to moderate levels of depression, but she is managing to move forward and make independent decisions for her future.    Lynn Gonzales, Ph.D.

## 2021-02-26 ENCOUNTER — Ambulatory Visit (INDEPENDENT_AMBULATORY_CARE_PROVIDER_SITE_OTHER): Payer: BC Managed Care – PPO | Admitting: Psychology

## 2021-02-26 DIAGNOSIS — F330 Major depressive disorder, recurrent, mild: Secondary | ICD-10-CM

## 2021-02-26 NOTE — Progress Notes (Signed)
° ° ° ° ° ° ° ° ° ° ° ° ° ° °  Evadna Donaghy Ann Jose Corvin, PhD °

## 2021-02-26 NOTE — Progress Notes (Addendum)
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 (Status: maintained) -- Skipping meals due to loss of appetite, or busy schedule  Depressed or irritable mood. (Status: maintained) -- Sad affect most of the time, easily losses patience  Diminished interest in or enjoyment of activities. (Status: maintained) -- Difficulties with motivation for normal everyday activities  Feelings of hopelessness, worthlessness, or inappropriate guilt. (Status: maintained) -- Familiy pressures making her feel guilty, disappointed in self  History of chronic or recurrent depression for which the client has taken antidepressant medication, been hospitalized, had outpatient treatment, or had a course of electroconvulsive therapy. (Status: maintained) -- Client and famiy describe long history of depression and previous suicide ideation and gestures  Lack of energy. (Status: maintained) -- Constant fatigue  Low self-esteem. (Status: maintained) -- No Description Entered  Poor concentration and indecisiveness. (Status: maintained) -- No Description Entered  Medication Status na  Safety none  If Suicidal or Homicidal State Action Taken: unspecified  Current Risk: low Medications unspecified Objectives Related Problem: Balance life activities between consideration of others and development of own interests. Description: Identify values that guide life's decisions and determine fulfillment. Target Date: 2021-06-19 Frequency: Weekly Modality: individual Progress: 20%  Related Problem: Balance life activities between consideration of others and development of own interests. Description: Implement new activities that increase a sense of satisfaction. Target Date: 2021-06-19 Frequency: Weekly Modality: individual Progress: 20%  Related  Problem: Balance life activities between consideration of others and development of own interests. Description: Implement increased assertiveness to take control of conflicts. Target Date: 2021-06-19 Frequency: Weekly Modality: individual Progress: 10%  Planned Intervention: Encourage the patient to use assertiveness skills and boundary setting application to the client's daily life.  Related Problem: Balance life activities between consideration of others and development of own interests. Description: Apply problem-solving skills to current circumstances. Target Date: 2021-06-19 Frequency: Weekly Modality: individual Progress: 10%  Related Problem: Balance life activities between consideration of others and development of own interests. Description: Increase communication with significant others regarding current life stress factors. Target Date: 2021-06-19 Frequency: Weekly Modality: individual Progress: 10%  Related Problem: Balance life activities between consideration of others and development of own interests. Description: Implement changes in time and effort allocation to restore balance to life. Target Date: 2021-06-19 Frequency: Weekly Modality: individual Progress: 0%  Related Problem: Balance life activities between consideration of others and development of own interests. Description: Increase social contacts to reduce sense of isolation. Target Date: 2021-06-19 Frequency: Weekly Modality: individual Progress: 0%  Related Problem: Balance life activities between consideration of others and development of own interests. Description: Share emotional struggles related to current adjustment stress. Target Date: 2021-06-19 Frequency: Weekly Modality: individual Progress: 60%  Related Problem: Balance life activities between consideration of others and development of own interests. Description: Significant others offer support to reduce the client's stress. Target  Date: 2021-06-19 Frequency: Weekly Modality: individual Progress: 10%  Related Problem: Balance life activities between consideration of others and development of own interests. Description: Describe the circumstances of life that are contributing to stress, anxiety, or lack of fulfillment. Target Date: 2021-06-19 Frequency: Weekly Modality: individual Progress: 50%  Related Problem: Balance life activities between consideration of others and development of own interests. Description: Increase activities that reinforce a positive self-identity. Target Date: 2021-06-19 Frequency: Weekly Modality: individual Progress: 30%  Related Problem: Develop healthy interpersonal  relationships that lead to the alleviation and help prevent the relapse of depression. Description: Describe current and past experiences with depression including their impact on functioning and attempts to resolve it. Target Date: 2021-06-19 Frequency: Weekly Modality: individual Progress: 0%  Client Response full compliance  Service Location Location, [address] [place], [place] [address]  Service Code cpt [phone] Clarify interpersonal incident (IPT)  Identify/label emotions  Validate/empathize   Communication analysis (IPT)  Rationally challenge thoughts or beliefs/cognitive restructuring  Identify automatic thoughts  Lifestyle change (exercise, nutrition)  Self care activities  Comments    Today I met with [name] in remote video (WebEx) face to face individual psychotherapy as l an accommodation during the pandemic.   Distance Site: Client's Home Originating Site: Dr Jannifer Franklin Remote Office Obtained consent to transmit remotely: Verbal  Pronouns: She/Her   Annie Sable reports that [name] was in his first post-surgical appointment.  She feels like she has been his "punching bag" (metaphorically speaking) when he is tired or frustrated.  He got very depressed as a result of the family  conflict that occurred around the time of the surgery.  We d/ measures she is taking to manage her stress and protect her wellbeing during a trying time.  Enedina had some family concerns arise over the week.  We d/e/p the conversations, responses/reactions and p/s moving forward.   Progress: Kynleigh continues to struggle with mild to moderate levels of depression, but she is managing to move forward and make independent decisions for her future.    [name], Ph.D.               [name], PhD

## 2021-03-05 ENCOUNTER — Ambulatory Visit (INDEPENDENT_AMBULATORY_CARE_PROVIDER_SITE_OTHER): Payer: BC Managed Care – PPO | Admitting: Psychology

## 2021-03-05 DIAGNOSIS — F330 Major depressive disorder, recurrent, mild: Secondary | ICD-10-CM | POA: Diagnosis not present

## 2021-03-05 NOTE — Progress Notes (Signed)
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 (Status: maintained) -- Skipping meals due to loss of appetite, or busy schedule  Depressed or irritable mood. (Status: maintained) -- Sad affect most of the time, easily losses patience  Diminished interest in or enjoyment of activities. (Status: maintained) -- Difficulties with motivation for normal everyday activities  Feelings of hopelessness, worthlessness, or inappropriate guilt. (Status: maintained) -- Familiy pressures making her feel guilty, disappointed in self  History of chronic or recurrent depression for which the client has taken antidepressant medication, been hospitalized, had outpatient treatment, or had a course of electroconvulsive therapy. (Status: maintained) -- Client and famiy describe long history of depression and previous suicide ideation and gestures  Lack of energy. (Status: maintained) -- Constant fatigue  Low self-esteem. (Status: maintained) -- No Description Entered  Poor concentration and indecisiveness. (Status: maintained) -- No Description Entered  Medication Status na  Safety none  If Suicidal or Homicidal State Action Taken: unspecified  Current Risk: low Medications unspecified Objectives Related Problem: Balance life activities between consideration of others and development of own interests. Description: Identify values that guide life's decisions and determine fulfillment. Target Date: 2021-06-19 Frequency: Weekly Modality: individual Progress: 20%  Related Problem: Balance life activities between consideration of others and development of own interests. Description: Implement new activities that increase a sense of satisfaction. Target Date: 2021-06-19 Frequency: Weekly Modality: individual Progress: 20%  Related  Problem: Balance life activities between consideration of others and development of own interests. Description: Implement increased assertiveness to take control of conflicts. Target Date: 2021-06-19 Frequency: Weekly Modality: individual Progress: 10%  Planned Intervention: Encourage the patient to use assertiveness skills and boundary setting application to the client's daily life.  Related Problem: Balance life activities between consideration of others and development of own interests. Description: Apply problem-solving skills to current circumstances. Target Date: 2021-06-19 Frequency: Weekly Modality: individual Progress: 10%  Related Problem: Balance life activities between consideration of others and development of own interests. Description: Increase communication with significant others regarding current life stress factors. Target Date: 2021-06-19 Frequency: Weekly Modality: individual Progress: 10%  Related Problem: Balance life activities between consideration of others and development of own interests. Description: Implement changes in time and effort allocation to restore balance to life. Target Date: 2021-06-19 Frequency: Weekly Modality: individual Progress: 0%  Related Problem: Balance life activities between consideration of others and development of own interests. Description: Increase social contacts to reduce sense of isolation. Target Date: 2021-06-19 Frequency: Weekly Modality: individual Progress: 0%  Related Problem: Balance life activities between consideration of others and development of own interests. Description: Share emotional struggles related to current adjustment stress. Target Date: 2021-06-19 Frequency: Weekly Modality: individual Progress: 60%  Related Problem: Balance life activities between consideration of others and development of own interests. Description: Significant others offer support to reduce the client's stress. Target  Date: 2021-06-19 Frequency: Weekly Modality: individual Progress: 10%  Related Problem: Balance life activities between consideration of others and development of own interests. Description: Describe the circumstances of life that are contributing to stress, anxiety, or lack of fulfillment. Target Date: 2021-06-19 Frequency: Weekly Modality: individual Progress: 50%  Related Problem: Balance life activities between consideration of others and development of own interests. Description: Increase activities that reinforce a positive self-identity. Target Date: 2021-06-19 Frequency: Weekly Modality: individual Progress: 30%  Related Problem: Develop healthy interpersonal  relationships that lead to the alleviation and help prevent the relapse of depression. Description: Describe current and past experiences with depression including their impact on functioning and attempts to resolve it. Target Date: 2021-06-19 Frequency: Weekly Modality: individual Progress: 0%  Client Response full compliance  Service Location Location, [address] [place], [place] [address]  Service Code cpt [phone] [id number]) Clarify interpersonal incident (IPT)  Identify/label emotions  Validate/empathize   Communication analysis (IPT)  Rationally challenge thoughts or beliefs/cognitive restructuring  Identify automatic thoughts  Lifestyle change (exercise, nutrition)  Self care activities  Comments    Today I met with [name] in remote video (WebEx) face to face individual psychotherapy as l an accommodation during the pandemic.   Distance Site: Client's Home Originating Site: Dr Jannifer Franklin Remote Office Obtained consent to transmit remotely: Verbal  Pronouns: She/Her   Annie Sable reports that she went with [name] to his doctor's appointment.  They were informed that he wouldn't be able to do most things until April.  Ritu is having to manage her expectations.  She is having difficulties with  her mother's attitude towards [place].  I encouraged her to set limits on her and gave her some guidance on how to do so.    Progress: Latisa continues to struggle with mild to moderate levels of depression, but she is managing to move forward and make independent decisions for her future.     [name], PhD

## 2021-03-05 NOTE — Progress Notes (Signed)
° ° ° ° ° ° ° ° ° ° ° ° ° ° °  Lynn Gonzales Lynn Steffany Schoenfelder, PhD °

## 2021-03-09 ENCOUNTER — Ambulatory Visit (INDEPENDENT_AMBULATORY_CARE_PROVIDER_SITE_OTHER): Payer: BC Managed Care – PPO | Admitting: Psychology

## 2021-03-09 DIAGNOSIS — F411 Generalized anxiety disorder: Secondary | ICD-10-CM | POA: Diagnosis not present

## 2021-03-09 NOTE — Progress Notes (Signed)
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 (Status: maintained) -- Skipping meals due to loss of appetite, or busy schedule  Depressed or irritable mood. (Status: maintained) -- Sad affect most of the time, easily losses patience  Diminished interest in or enjoyment of activities. (Status: maintained) -- Difficulties with motivation for normal everyday activities  Feelings of hopelessness, worthlessness, or inappropriate guilt. (Status: maintained) -- Familiy pressures making her feel guilty, disappointed in self  History of chronic or recurrent depression for which the client has taken antidepressant medication, been hospitalized, had outpatient treatment, or had a course of electroconvulsive therapy. (Status: maintained) -- Client and famiy describe long history of depression and previous suicide ideation and gestures  Lack of energy. (Status: maintained) -- Constant fatigue  Low self-esteem. (Status: maintained) -- No Description Entered  Poor concentration and indecisiveness. (Status: maintained) -- No Description Entered  Medication Status na  Safety none  If Suicidal or Homicidal State Action Taken: unspecified  Current Risk: low Medications unspecified Objectives Related Problem: Balance life activities between consideration of others and development of own interests. Description: Identify values that guide life's decisions and determine fulfillment. Target Date: 2021-06-19 Frequency: Weekly Modality: individual Progress: 20%  Related Problem: Balance life activities between consideration of others and development of own interests. Description: Implement new activities that increase a sense of satisfaction. Target Date: 2021-06-19 Frequency: Weekly Modality:  individual Progress: 20%  Related Problem: Balance life activities between consideration of others and development of own interests. Description: Implement increased assertiveness to take control of conflicts. Target Date: 2021-06-19 Frequency: Weekly Modality: individual Progress: 10%  Planned Intervention: Encourage the patient to use assertiveness skills and boundary setting application to the client's daily life.  Related Problem: Balance life activities between consideration of others and development of own interests. Description: Apply problem-solving skills to current circumstances. Target Date: 2021-06-19 Frequency: Weekly Modality: individual Progress: 10%  Related Problem: Balance life activities between consideration of others and development of own interests. Description: Increase communication with significant others regarding current life stress factors. Target Date: 2021-06-19 Frequency: Weekly Modality: individual Progress: 10%  Related Problem: Balance life activities between consideration of others and development of own interests. Description: Implement changes in time and effort allocation to restore balance to life. Target Date: 2021-06-19 Frequency: Weekly Modality: individual Progress: 0%  Related Problem: Balance life activities between consideration of others and development of own interests. Description: Increase social contacts to reduce sense of isolation. Target Date: 2021-06-19 Frequency: Weekly Modality: individual Progress: 0%  Related Problem: Balance life activities between consideration of others and development of own interests. Description: Share emotional struggles related to current adjustment stress. Target Date: 2021-06-19 Frequency: Weekly Modality: individual Progress: 60%  Related Problem: Balance life activities between consideration of others and development of own interests. Description: Significant others offer support to  reduce the client's stress. Target Date: 2021-06-19 Frequency: Weekly Modality: individual Progress: 10%  Related Problem: Balance life activities between consideration of others and development of own interests. Description: Describe the circumstances of life that are contributing to stress, anxiety, or lack of fulfillment. Target Date: 2021-06-19 Frequency: Weekly Modality: individual Progress: 50%  Related Problem: Balance life activities between consideration of others and development of own interests. Description: Increase activities that reinforce a positive self-identity. Target Date: 2021-06-19 Frequency: Weekly Modality: individual Progress: 30%  Related Problem: Develop healthy interpersonal relationships that lead to the alleviation and help prevent the relapse of depression. Description: Describe current and past experiences with depression including their impact on functioning and attempts to resolve it. Target Date: 2021-06-19 Frequency: Weekly Modality: individual Progress: 0%  Client Response full compliance  Service Location Location, [address] [place], [place] [address]  Service Code cpt [phone]) Clarify interpersonal incident (IPT)  Identify/label emotions  Validate/empathize   Communication analysis (IPT)  Rationally challenge thoughts or beliefs/cognitive restructuring  Identify automatic thoughts  Lifestyle change (exercise, nutrition)  Self care activities  Comments    Today I met with Lynn Gonzales in remote video (WebEx) face to face individual psychotherapy as l an accommodation during the pandemic.   Distance Site: Client's Home Originating Site: Dr Jannifer Franklin Remote Office Obtained consent to transmit remotely: Verbal  Pronouns: She/Her   Lynn Gonzales reports that on Thursday she was experiencing extremely painful cramping.  She went to the ER and they told her she was pregnant.  Things escalated from there when they told her they  suspected an ectopic pregnancy.  She was told to come into the office were they could perform an exam and terminate the high risk pregnancy.  Lynn Gonzales was then told that while they were not able to see the embryonic sac in the ultrasound they changed their initial diagnosis and told her it was still a viable pregnanacy. No further explanation was given for the pain she was continuing to experience (rating 6-8) other than it was that she was pregnant.  In session, we d/e/p all that occurred in the past several days, her feelings about the pregnancy and the possibility of needing to terminate the pregnancy should it become a risk to her health, her comfort level in returning to the same practice and possible alternative options for care.  Lastly, we d/ ways for her to create some calm and rest for herself.    Progress: Lynn Gonzales continues to struggle with mild to moderate levels of depression, but she is managing to move forward and make independent decisions for her future.     [name], PhD

## 2021-03-12 ENCOUNTER — Ambulatory Visit (INDEPENDENT_AMBULATORY_CARE_PROVIDER_SITE_OTHER): Payer: BC Managed Care – PPO | Admitting: Psychology

## 2021-03-12 DIAGNOSIS — F330 Major depressive disorder, recurrent, mild: Secondary | ICD-10-CM | POA: Diagnosis not present

## 2021-03-12 NOTE — Progress Notes (Signed)
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 (Status: maintained) -- Skipping meals due to loss of appetite, or busy schedule  Depressed or irritable mood. (Status: maintained) -- Sad affect most of the time, easily losses patience  Diminished interest in or enjoyment of activities. (Status: maintained) -- Difficulties with motivation for normal everyday activities  Feelings of hopelessness, worthlessness, or inappropriate guilt. (Status: maintained) -- Familiy pressures making her feel guilty, disappointed in self  History of chronic or recurrent depression for which the client has taken antidepressant medication, been hospitalized, had outpatient treatment, or had a course of electroconvulsive therapy. (Status: maintained) -- Client and famiy describe long history of depression and previous suicide ideation and gestures  Lack of energy. (Status: maintained) -- Constant fatigue  Low self-esteem. (Status: maintained) -- No Description Entered  Poor concentration and indecisiveness. (Status: maintained) -- No Description Entered  Medication Status na  Safety none  If Suicidal or Homicidal State Action Taken: unspecified  Current Risk: low Medications unspecified Objectives Related Problem: Balance life activities between consideration of others and development of own interests. Description: Identify values that guide life's decisions and determine fulfillment. Target Date: 2021-06-19 Frequency: Weekly Modality: individual Progress: 20%  Related Problem: Balance life activities between consideration of others and development of own interests. Description: Implement new activities that increase a sense of satisfaction. Target Date: 2021-06-19 Frequency: Weekly Modality:  individual Progress: 20%  Related Problem: Balance life activities between consideration of others and development of own interests. Description: Implement increased assertiveness to take control of conflicts. Target Date: 2021-06-19 Frequency: Weekly Modality: individual Progress: 10%  Planned Intervention: Encourage the patient to use assertiveness skills and boundary setting application to the client's daily life.  Related Problem: Balance life activities between consideration of others and development of own interests. Description: Apply problem-solving skills to current circumstances. Target Date: 2021-06-19 Frequency: Weekly Modality: individual Progress: 10%  Related Problem: Balance life activities between consideration of others and development of own interests. Description: Increase communication with significant others regarding current life stress factors. Target Date: 2021-06-19 Frequency: Weekly Modality: individual Progress: 10%  Related Problem: Balance life activities between consideration of others and development of own interests. Description: Implement changes in time and effort allocation to restore balance to life. Target Date: 2021-06-19 Frequency: Weekly Modality: individual Progress: 0%  Related Problem: Balance life activities between consideration of others and development of own interests. Description: Increase social contacts to reduce sense of isolation. Target Date: 2021-06-19 Frequency: Weekly Modality: individual Progress: 0%  Related Problem: Balance life activities between consideration of others and development of own interests. Description: Share emotional struggles related to current adjustment stress. Target Date: 2021-06-19 Frequency: Weekly Modality: individual Progress: 60%  Related Problem: Balance life activities between consideration of others and development of own interests. Description: Significant others offer support to  reduce the client's stress. Target Date: 2021-06-19 Frequency: Weekly Modality: individual Progress: 10%  Related Problem: Balance life activities between consideration of others and development of own interests. Description: Describe the circumstances of life that are contributing to stress, anxiety, or lack of fulfillment. Target Date: 2021-06-19 Frequency: Weekly Modality: individual Progress: 50%  Related Problem: Balance life activities between consideration of others and development of own interests. Description: Increase activities that reinforce a positive self-identity. Target Date: 2021-06-19 Frequency: Weekly Modality: individual Progress: 30%  Related Problem: Develop healthy interpersonal relationships that lead to the alleviation and help prevent the relapse of depression. Description: Describe current and past experiences with depression including their impact on functioning and attempts to resolve it. Target Date: 2021-06-19 Frequency: Weekly Modality: individual Progress: 0%  Client Response full compliance  Service Location Location, [address] [place], [place] [address]  Service Code cpt [phone]) Clarify interpersonal incident (IPT)  Identify/label emotions  Validate/empathize   Communication analysis (IPT)  Rationally challenge thoughts or beliefs/cognitive restructuring  Identify automatic thoughts  Lifestyle change (exercise, nutrition)  Self care activities Grief and Loss Work Comments    Today I met with [name] in remote video (WebEx) face to face individual psychotherapy as l an accommodation during the pandemic.   Distance Site: Client's Home Originating Site: Dr Jannifer Franklin Remote Office Obtained consent to transmit remotely: Verbal  Pronouns: She/Her   Sheyna reports that she went in for blood work and her numbers didn't look good.  As a result, they decided that the pregnancy was no viable.  We d/ all the different  options she faced, having to push for what she needed for her peace of mind and the decision she made.  Terita was second guessing her decision to terminate the pregnancy.  Alexiah was surprised that she is cring a lot for the loss of the prgnancy and this has surprised her.  I provide information on ambiguous loss.  We did some grief and loss work.  I encouraged Apurva to give herself time to grieve and recover from the surgery.   Progress: Tamasha continues to struggle with mild to moderate levels of depression, but she is managing to move forward and make independent decisions for her future.     [name], PhD

## 2021-03-19 ENCOUNTER — Ambulatory Visit (INDEPENDENT_AMBULATORY_CARE_PROVIDER_SITE_OTHER): Payer: BC Managed Care – PPO | Admitting: Psychology

## 2021-03-19 DIAGNOSIS — F330 Major depressive disorder, recurrent, mild: Secondary | ICD-10-CM | POA: Diagnosis not present

## 2021-03-19 NOTE — Progress Notes (Signed)
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 (Status: maintained) -- Skipping meals due to loss of appetite, or busy schedule  Depressed or irritable mood. (Status: maintained) -- Sad affect most of the time, easily losses patience  Diminished interest in or enjoyment of activities. (Status: maintained) -- Difficulties with motivation for normal everyday activities  Feelings of hopelessness, worthlessness, or inappropriate guilt. (Status: maintained) -- Familiy pressures making her feel guilty, disappointed in self  History of chronic or recurrent depression for which the client has taken antidepressant medication, been hospitalized, had outpatient treatment, or had a course of electroconvulsive therapy. (Status: maintained) -- Client and famiy describe long history of depression and previous suicide ideation and gestures  Lack of energy. (Status: maintained) -- Constant fatigue  Low self-esteem. (Status: maintained) -- No Description Entered  Poor concentration and indecisiveness. (Status: maintained) -- No Description Entered  Medication Status na  Safety none  If Suicidal or Homicidal State Action Taken: unspecified  Current Risk: low Medications unspecified Objectives Related Problem: Balance life activities between consideration of others and development of own interests. Description: Identify values that guide life's decisions and determine fulfillment. Target Date: 2021-06-19 Frequency: Weekly Modality: individual Progress: 20%  Related Problem: Balance life activities between consideration of others and development of own interests. Description: Implement new activities that increase a sense of satisfaction. Target Date: 2021-06-19 Frequency: Weekly Modality:  individual Progress: 20%  Related Problem: Balance life activities between consideration of others and development of own interests. Description: Implement increased assertiveness to take control of conflicts. Target Date: 2021-06-19 Frequency: Weekly Modality: individual Progress: 10%  Planned Intervention: Encourage the patient to use assertiveness skills and boundary setting application to the client's daily life.  Related Problem: Balance life activities between consideration of others and development of own interests. Description: Apply problem-solving skills to current circumstances. Target Date: 2021-06-19 Frequency: Weekly Modality: individual Progress: 10%  Related Problem: Balance life activities between consideration of others and development of own interests. Description: Increase communication with significant others regarding current life stress factors. Target Date: 2021-06-19 Frequency: Weekly Modality: individual Progress: 10%  Related Problem: Balance life activities between consideration of others and development of own interests. Description: Implement changes in time and effort allocation to restore balance to life. Target Date: 2021-06-19 Frequency: Weekly Modality: individual Progress: 0%  Related Problem: Balance life activities between consideration of others and development of own interests. Description: Increase social contacts to reduce sense of isolation. Target Date: 2021-06-19 Frequency: Weekly Modality: individual Progress: 0%  Related Problem: Balance life activities between consideration of others and development of own interests. Description: Share emotional struggles related to current adjustment stress. Target Date: 2021-06-19 Frequency: Weekly Modality: individual Progress: 60%  Related Problem: Balance life activities between consideration of others and development of own interests. Description: Significant others offer support to  reduce the client's stress. Target Date: 2021-06-19 Frequency: Weekly Modality: individual Progress: 10%  Related Problem: Balance life activities between consideration of others and development of own interests. Description: Describe the circumstances of life that are contributing to stress, anxiety, or lack of fulfillment. Target Date: 2021-06-19 Frequency: Weekly Modality: individual Progress: 50%  Related Problem: Balance life activities between consideration of others and development of own interests. Description: Increase activities that reinforce a positive self-identity. Target Date: 2021-06-19 Frequency: Weekly Modality: individual Progress: 30%  Related Problem: Develop healthy interpersonal relationships that lead to the alleviation and help prevent the relapse of depression. Description: Describe current and past experiences with depression including their impact on functioning and attempts to resolve it. Target Date: 2021-06-19 Frequency: Weekly Modality: individual Progress: 0%  Client Response full compliance  Service Location Location, [address] [place], [place] [address]  Service Code cpt [phone]) Clarify interpersonal incident (IPT)  Identify/label emotions  Validate/empathize   Communication analysis (IPT)  Rationally challenge thoughts or beliefs/cognitive restructuring  Identify automatic thoughts  Lifestyle change (exercise, nutrition)  Self care activities Grief and Loss Work Comments    Today I met with Lynn Gonzales and her husband Lynn Gonzales in remote video (WebEx) face to face individual psychotherapy as l an accommodation during the pandemic.   Distance Site: Client's Home Originating Site: Dr Jannifer Franklin Remote Office Obtained consent to transmit remotely: Verbal  Pronouns: She/Her   Lynn Gonzales reports that she needed some guidance about how to set boundaries with her parents.  She shared a situation that occurred with her  mother with week which created difficulties between her and Lynn Gonzales.  The couple d/e/p some of the difficulties they are having with Lynn Gonzales's mother.  I guided the couple in a d/ about how they would like their communication to be better, how to communicate what should remain between only them and how to work toward resolving issues w/o relying on Lynn Gonzales's parents.    Progress: Lynn Gonzales continues to struggle with mild to moderate levels of depression, but she is managing to move forward and make independent decisions for her future.     [name], PhD

## 2021-03-23 ENCOUNTER — Ambulatory Visit (INDEPENDENT_AMBULATORY_CARE_PROVIDER_SITE_OTHER): Payer: BC Managed Care – PPO | Admitting: Psychology

## 2021-03-23 DIAGNOSIS — F330 Major depressive disorder, recurrent, mild: Secondary | ICD-10-CM | POA: Diagnosis not present

## 2021-03-23 NOTE — Progress Notes (Signed)
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 (Status: maintained) -- Skipping meals due to loss of appetite, or busy schedule  Depressed or irritable mood. (Status: maintained) -- Sad affect most of the time, easily losses patience  Diminished interest in or enjoyment of activities. (Status: maintained) -- Difficulties with motivation for normal everyday activities  Feelings of hopelessness, worthlessness, or inappropriate guilt. (Status: maintained) -- Familiy pressures making her feel guilty, disappointed in self  History of chronic or recurrent depression for which the client has taken antidepressant medication, been hospitalized, had outpatient treatment, or had a course of electroconvulsive therapy. (Status: maintained) -- Client and famiy describe long history of depression and previous suicide ideation and gestures  Lack of energy. (Status: maintained) -- Constant fatigue  Low self-esteem. (Status: maintained) -- No Description Entered  Poor concentration and indecisiveness. (Status: maintained) -- No Description Entered  Medication Status na  Safety none  If Suicidal or Homicidal State Action Taken: unspecified  Current Risk: low Medications unspecified Objectives Related Problem: Balance life activities between consideration of others and development of own interests. Description: Identify values that guide life's decisions and determine fulfillment. Target Date: 2021-06-19 Frequency: Weekly Modality: individual Progress: 20%  Related Problem: Balance life activities between consideration of others and development of own interests. Description: Implement new activities that increase a sense of satisfaction. Target Date: 2021-06-19 Frequency: Weekly Modality:  individual Progress: 20%  Related Problem: Balance life activities between consideration of others and development of own interests. Description: Implement increased assertiveness to take control of conflicts. Target Date: 2021-06-19 Frequency: Weekly Modality: individual Progress: 10%  Planned Intervention: Encourage the patient to use assertiveness skills and boundary setting application to the client's daily life.  Related Problem: Balance life activities between consideration of others and development of own interests. Description: Apply problem-solving skills to current circumstances. Target Date: 2021-06-19 Frequency: Weekly Modality: individual Progress: 10%  Related Problem: Balance life activities between consideration of others and development of own interests. Description: Increase communication with significant others regarding current life stress factors. Target Date: 2021-06-19 Frequency: Weekly Modality: individual Progress: 10%  Related Problem: Balance life activities between consideration of others and development of own interests. Description: Implement changes in time and effort allocation to restore balance to life. Target Date: 2021-06-19 Frequency: Weekly Modality: individual Progress: 0%  Related Problem: Balance life activities between consideration of others and development of own interests. Description: Increase social contacts to reduce sense of isolation. Target Date: 2021-06-19 Frequency: Weekly Modality: individual Progress: 0%  Related Problem: Balance life activities between consideration of others and development of own interests. Description: Share emotional struggles related to current adjustment stress. Target Date: 2021-06-19 Frequency: Weekly Modality: individual Progress: 60%  Related Problem: Balance life activities between consideration of others and development of own interests. Description: Significant others offer support to  reduce the client's stress. Target Date: 2021-06-19 Frequency: Weekly Modality: individual Progress: 10%  Related Problem: Balance life activities between consideration of others and development of own interests. Description: Describe the circumstances of life that are contributing to stress, anxiety, or lack of fulfillment. Target Date: 2021-06-19 Frequency: Weekly Modality: individual Progress: 50%  Related Problem: Balance life activities between consideration of others and development of own interests. Description: Increase activities that reinforce a positive self-identity. Target Date: 2021-06-19 Frequency: Weekly Modality: individual Progress: 30%  Related Problem: Develop healthy interpersonal relationships that lead to the alleviation and help prevent the relapse of depression. Description: Describe current and past experiences with depression including their impact on functioning and attempts to resolve it. Target Date: 2021-06-19 Frequency: Weekly Modality: individual Progress: 0%  Client Response full compliance  Service Location Location, [address] [place], [place] [address]  Service Code cpt [phone]) Clarify interpersonal incident (IPT)  Identify/label emotions  Validate/empathize   Communication analysis (IPT)  Rationally challenge thoughts or beliefs/cognitive restructuring  Identify automatic thoughts  Lifestyle change (exercise, nutrition)  Self care activities Grief and Loss Work Comments    Today I met with [name] in remote video (WebEx) face to face individual psychotherapy as l an accommodation during the pandemic.   Distance Site: Client's Home Originating Site: Dr Jannifer Franklin Remote Office Obtained consent to transmit remotely: Verbal  Pronouns: She/Her   Annie Sable reports that she had a follow up appointment with her doctor.  The visit didn't go well and she feels like they are asking her to undergo an unnecessary procedure  instead of waiting on her labs.  I supported her advocating for herself.  Darlisa is feeling like she is alone with her grief as everyone else in her family seems to be having their own crisis.  We d/e/p all of the complicated marital and family issues she is struggling to understand and manage.  We agreed to meet twice a week for a period of time until things have stabilized in her life.     Progress: Sharlett continues to struggle with mild to moderate levels of depression, but she is managing to move forward and make independent decisions for her future.     [name], PhD               [name], PhD

## 2021-03-26 ENCOUNTER — Ambulatory Visit (INDEPENDENT_AMBULATORY_CARE_PROVIDER_SITE_OTHER): Payer: BC Managed Care – PPO | Admitting: Psychology

## 2021-03-26 DIAGNOSIS — F330 Major depressive disorder, recurrent, mild: Secondary | ICD-10-CM | POA: Diagnosis not present

## 2021-03-26 NOTE — Progress Notes (Signed)
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 (Status: maintained) -- Skipping meals due to loss of appetite, or busy schedule  Depressed or irritable mood. (Status: maintained) -- Sad affect most of the time, easily losses patience  Diminished interest in or enjoyment of activities. (Status: maintained) -- Difficulties with motivation for normal everyday activities  Feelings of hopelessness, worthlessness, or inappropriate guilt. (Status: maintained) -- Familiy pressures making her feel guilty, disappointed in self  History of chronic or recurrent depression for which the client has taken antidepressant medication, been hospitalized, had outpatient treatment, or had a course of electroconvulsive therapy. (Status: maintained) -- Client and famiy describe long history of depression and previous suicide ideation and gestures  Lack of energy. (Status: maintained) -- Constant fatigue  Low self-esteem. (Status: maintained) -- No Description Entered  Poor concentration and indecisiveness. (Status: maintained) -- No Description Entered  Medication Status na  Safety none  If Suicidal or Homicidal State Action Taken: unspecified  Current Risk: low Medications unspecified Objectives Related Problem: Balance life activities between consideration of others and development of own interests. Description: Identify values that guide life's decisions and determine fulfillment. Target Date: 2021-06-19 Frequency: Weekly Modality: individual Progress: 20%  Related Problem: Balance life activities between consideration of others and development of own interests. Description: Implement new activities that increase a sense of satisfaction. Target Date: 2021-06-19 Frequency: Weekly Modality:  individual Progress: 20%  Related Problem: Balance life activities between consideration of others and development of own interests. Description: Implement increased assertiveness to take control of conflicts. Target Date: 2021-06-19 Frequency: Weekly Modality: individual Progress: 10%  Planned Intervention: Encourage the patient to use assertiveness skills and boundary setting application to the client's daily life.  Related Problem: Balance life activities between consideration of others and development of own interests. Description: Apply problem-solving skills to current circumstances. Target Date: 2021-06-19 Frequency: Weekly Modality: individual Progress: 10%  Related Problem: Balance life activities between consideration of others and development of own interests. Description: Increase communication with significant others regarding current life stress factors. Target Date: 2021-06-19 Frequency: Weekly Modality: individual Progress: 10%  Related Problem: Balance life activities between consideration of others and development of own interests. Description: Implement changes in time and effort allocation to restore balance to life. Target Date: 2021-06-19 Frequency: Weekly Modality: individual Progress: 0%  Related Problem: Balance life activities between consideration of others and development of own interests. Description: Increase social contacts to reduce sense of isolation. Target Date: 2021-06-19 Frequency: Weekly Modality: individual Progress: 0%  Related Problem: Balance life activities between consideration of others and development of own interests. Description: Share emotional struggles related to current adjustment stress. Target Date: 2021-06-19 Frequency: Weekly Modality: individual Progress: 60%  Related Problem: Balance life activities between consideration of others and development of own interests. Description: Significant others offer support to  reduce the client's stress. Target Date: 2021-06-19 Frequency: Weekly Modality: individual Progress: 10%  Related Problem: Balance life activities between consideration of others and development of own interests. Description: Describe the circumstances of life that are contributing to stress, anxiety, or lack of fulfillment. Target Date: 2021-06-19 Frequency: Weekly Modality: individual Progress: 50%  Related Problem: Balance life activities between consideration of others and development of own interests. Description: Increase activities that reinforce a positive self-identity. Target Date: 2021-06-19 Frequency: Weekly Modality: individual Progress: 30%  Related Problem: Develop healthy interpersonal relationships that lead to the alleviation and help prevent the relapse of depression. Description: Describe current and past experiences with depression including their impact on functioning and attempts to resolve it. Target Date: 2021-06-19 Frequency: Weekly Modality: individual Progress: 0%  Client Response full compliance  Service Location Location, [address] [place], [place] [address]  Service Code cpt [phone]) Clarify interpersonal incident (IPT)  Identify/label emotions  Validate/empathize   Communication analysis (IPT)  Rationally challenge thoughts or beliefs/cognitive restructuring  Identify automatic thoughts  Lifestyle change (exercise, nutrition)  Self care activities Grief and Loss Work Comments    Today I met with Lynn Gonzales in remote video (WebEx) face to face individual psychotherapy as l an accommodation during the pandemic.   Distance Site: Client's Home Originating Site: Dr Jannifer Franklin Remote Office Obtained consent to transmit remotely: Verbal  Pronouns: She/Her   Lynn Gonzales reports that work has been a big source of stress. She described a concern about having her confidential health information shared with clients w/o her permission.   We d/e how she felt and what she wanted to say to her supervisor regarding this problem.  Lynn Gonzales states that she and Lynn Gonzales are hosting a [employer] party for their family and a few friends.  The event triggered much anticipatory anxiety for Lynn Gonzales due to issues recent with her family.  We d/e/p and p/s how to manage her own stress as well as how to help buffer the inevitable negative dynamic.  Lastly, I encouraged her to give herself some space to catch some quiet if she needed it.    Progress: Lynn Gonzales continues to struggle with mild to moderate levels of depression, but she is managing to move forward and make independent decisions for her future.    Lynn Crochet, PhD

## 2021-03-31 ENCOUNTER — Ambulatory Visit (INDEPENDENT_AMBULATORY_CARE_PROVIDER_SITE_OTHER): Payer: BC Managed Care – PPO | Admitting: Psychology

## 2021-03-31 DIAGNOSIS — F330 Major depressive disorder, recurrent, mild: Secondary | ICD-10-CM

## 2021-03-31 NOTE — Progress Notes (Signed)
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 (Status: maintained) -- Skipping meals due to loss of appetite, or busy schedule  Depressed or irritable mood. (Status: maintained) -- Sad affect most of the time, easily losses patience  Diminished interest in or enjoyment of activities. (Status: maintained) -- Difficulties with motivation for normal everyday activities  Feelings of hopelessness, worthlessness, or inappropriate guilt. (Status: maintained) -- Familiy pressures making her feel guilty, disappointed in self  History of chronic or recurrent depression for which the client has taken antidepressant medication, been hospitalized, had outpatient treatment, or had a course of electroconvulsive therapy. (Status: maintained) -- Client and famiy describe long history of depression and previous suicide ideation and gestures  Lack of energy. (Status: maintained) -- Constant fatigue  Low self-esteem. (Status: maintained) -- No Description Entered  Poor concentration and indecisiveness. (Status: maintained) -- No Description Entered  Medication Status na  Safety none  If Suicidal or Homicidal State Action Taken: unspecified  Current Risk: low Medications unspecified Objectives Related Problem: Balance life activities between consideration of others and development of own interests. Description: Identify values that guide life's decisions and determine fulfillment. Target Date: 2021-06-19 Frequency: Weekly Modality: individual Progress: 20%  Related Problem: Balance life activities between consideration of others and development of own interests. Description: Implement new activities that increase a sense of satisfaction. Target Date: 2021-06-19 Frequency: Weekly Modality:  individual Progress: 20%  Related Problem: Balance life activities between consideration of others and development of own interests. Description: Implement increased assertiveness to take control of conflicts. Target Date: 2021-06-19 Frequency: Weekly Modality: individual Progress: 10%  Planned Intervention: Encourage the patient to use assertiveness skills and boundary setting application to the client's daily life.  Related Problem: Balance life activities between consideration of others and development of own interests. Description: Apply problem-solving skills to current circumstances. Target Date: 2021-06-19 Frequency: Weekly Modality: individual Progress: 10%  Related Problem: Balance life activities between consideration of others and development of own interests. Description: Increase communication with significant others regarding current life stress factors. Target Date: 2021-06-19 Frequency: Weekly Modality: individual Progress: 10%  Related Problem: Balance life activities between consideration of others and development of own interests. Description: Implement changes in time and effort allocation to restore balance to life. Target Date: 2021-06-19 Frequency: Weekly Modality: individual Progress: 0%  Related Problem: Balance life activities between consideration of others and development of own interests. Description: Increase social contacts to reduce sense of isolation. Target Date: 2021-06-19 Frequency: Weekly Modality: individual Progress: 0%  Related Problem: Balance life activities between consideration of others and development of own interests. Description: Share emotional struggles related to current adjustment stress. Target Date: 2021-06-19 Frequency: Weekly Modality: individual Progress: 60%  Related Problem: Balance life activities between consideration of others and development of own interests. Description: Significant others offer support to  reduce the client's stress. Target Date: 2021-06-19 Frequency: Weekly Modality: individual Progress: 10%  Related Problem: Balance life activities between consideration of others and development of own interests. Description: Describe the circumstances of life that are contributing to stress, anxiety, or lack of fulfillment. Target Date: 2021-06-19 Frequency: Weekly Modality: individual Progress: 50%  Related Problem: Balance life activities between consideration of others and development of own interests. Description: Increase activities that reinforce a positive self-identity. Target Date: 2021-06-19 Frequency: Weekly Modality: individual Progress: 30%  Related Problem: Develop healthy interpersonal relationships that lead to the alleviation and help prevent the relapse of depression. Description: Describe current and past experiences with depression including their impact on functioning and attempts to resolve it. Target Date: 2021-06-19 Frequency: Weekly Modality: individual Progress: 0%  Client Response full compliance  Service Location Location, [address] [place], [place] [address]  Service Code cpt [phone]) Clarify interpersonal incident (IPT)  Identify/label emotions  Validate/empathize   Communication analysis (IPT)  Rationally challenge thoughts or beliefs/cognitive restructuring  Identify automatic thoughts  Lifestyle change (exercise, nutrition)  Self care activities Grief and Loss Work Comments    Today I met with [name] in remote video (WebEx) face to face individual psychotherapy as l an accommodation during the pandemic.   Distance Site: Client's Home Originating Site: Dr Jannifer Franklin Remote Office Obtained consent to transmit remotely: Verbal  Pronouns: She/Her   Safia reports that work continues to be a big source of stress.  She shared the situations she was dealing with we p/s ways to best move forward.  I was able to support her  advocating for herself.    Annie Sable and DeAnthony hosted a [employer] party for their family and a few friends.  The event  went very well.  All the anticipatory anxiety [name] was experiencing was a waste of energy.  .  We d/e/p and p/s how she managed her own stress given the family dynamics.  Lastly, Annie Sable and I d/p that keep up appropriate.  Boundaries with her mother can be hard.  We d/ that she can spend time with her mother in the garden where she can ask for help/advice and feel supported in a safe space.  Progress: Kimmberly continues to struggle with mild to moderate levels of depression, but she is managing to move forward and make independent decisions for her future.    [name], PhD

## 2021-04-02 ENCOUNTER — Ambulatory Visit (INDEPENDENT_AMBULATORY_CARE_PROVIDER_SITE_OTHER): Payer: BC Managed Care – PPO | Admitting: Psychology

## 2021-04-02 DIAGNOSIS — F330 Major depressive disorder, recurrent, mild: Secondary | ICD-10-CM | POA: Diagnosis not present

## 2021-04-02 NOTE — Progress Notes (Signed)
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 (Status: maintained) -- Skipping meals due to loss of appetite, or busy schedule  Depressed or irritable mood. (Status: maintained) -- Sad affect most of the time, easily losses patience  Diminished interest in or enjoyment of activities. (Status: maintained) -- Difficulties with motivation for normal everyday activities  Feelings of hopelessness, worthlessness, or inappropriate guilt. (Status: maintained) -- Familiy pressures making her feel guilty, disappointed in self  History of chronic or recurrent depression for which the client has taken antidepressant medication, been hospitalized, had outpatient treatment, or had a course of electroconvulsive therapy. (Status: maintained) -- Client and famiy describe long history of depression and previous suicide ideation and gestures  Lack of energy. (Status: maintained) -- Constant fatigue  Low self-esteem. (Status: maintained) -- No Description Entered  Poor concentration and indecisiveness. (Status: maintained) -- No Description Entered  Medication Status na  Safety none  If Suicidal or Homicidal State Action Taken: unspecified  Current Risk: low Medications unspecified Objectives Related Problem: Balance life activities between consideration of others and development of own interests. Description: Identify values that guide life's decisions and determine fulfillment. Target Date: 2021-06-19 Frequency: Weekly Modality: individual Progress: 20%  Related Problem: Balance life activities between consideration of others and development of own interests. Description: Implement new activities that increase a sense of satisfaction. Target Date: 2021-06-19 Frequency:  Weekly Modality: individual Progress: 20%  Related Problem: Balance life activities between consideration of others and development of own interests. Description: Implement increased assertiveness to take control of conflicts. Target Date: 2021-06-19 Frequency: Weekly Modality: individual Progress: 10%  Planned Intervention: Encourage the patient to use assertiveness skills and boundary setting application to the client's daily life.  Related Problem: Balance life activities between consideration of others and development of own interests. Description: Apply problem-solving skills to current circumstances. Target Date: 2021-06-19 Frequency: Weekly Modality: individual Progress: 10%  Related Problem: Balance life activities between consideration of others and development of own interests. Description: Increase communication with significant others regarding current life stress factors. Target Date: 2021-06-19 Frequency: Weekly Modality: individual Progress: 10%  Related Problem: Balance life activities between consideration of others and development of own interests. Description: Implement changes in time and effort allocation to restore balance to life. Target Date: 2021-06-19 Frequency: Weekly Modality: individual Progress: 0%  Related Problem: Balance life activities between consideration of others and development of own interests. Description: Increase social contacts to reduce sense of isolation. Target Date: 2021-06-19 Frequency: Weekly Modality: individual Progress: 0%  Related Problem: Balance life activities between consideration of others and development of own interests. Description: Share emotional struggles related to current adjustment stress. Target Date: 2021-06-19 Frequency: Weekly Modality: individual Progress: 60%  Related Problem: Balance life activities between consideration of others and development of own interests. Description: Significant others  offer support to reduce the client's stress. Target Date: 2021-06-19 Frequency: Weekly Modality: individual Progress: 10%  Related Problem: Balance life activities between consideration of others and development of own interests. Description: Describe the circumstances of life that are contributing to stress, anxiety, or lack of fulfillment. Target Date: 2021-06-19 Frequency: Weekly Modality: individual Progress: 50%  Related Problem: Balance life activities between consideration of others and development of own interests. Description: Increase activities that reinforce a positive self-identity. Target Date: 2021-06-19 Frequency: Weekly Modality: individual  Progress: 30%  Related Problem: Develop healthy interpersonal relationships that lead to the alleviation and help prevent the relapse of depression. Description: Describe current and past experiences with depression including their impact on functioning and attempts to resolve it. Target Date: 2021-06-19 Frequency: Weekly Modality: individual Progress: 0%  Client Response full compliance  Service Location Location, [address] [place], [place] [address]  Service Code cpt [id number] [phone]) Clarify interpersonal incident (IPT)  Identify/label emotions  Validate/empathize   Communication analysis (IPT)  Rationally challenge thoughts or beliefs/cognitive restructuring  Identify automatic thoughts  Lifestyle change (exercise, nutrition)  Self care activities Grief and Loss Work Comments    Today I met with Lynn Gonzales in remote video (WebEx) face to face individual psychotherapy as l an accommodation during the pandemic.   Distance Site: Client's Home Originating Site: Dr Jannifer Franklin Remote Office Obtained consent to transmit remotely: Verbal  Pronouns: She/Her   Lynn Gonzales reports that work continues to be a big source of stress.  She shared the most recent situation she was dealing with at work, her frustration and  what she needs to do in order to advocate for herself.  Lynn Gonzales's mother is encouraging her to leave and find another job.  We d/e/p the costs and benefits of staying until August.    Lastly, Lynn Gonzales states that her son has been waking in the middle of the night "screaming" and I shard to settle.  I noted that this may be his response to the additional tension a home during this very stressful time.  I provided some psycho education about stress cycle and response and calming technique she might use with [name].  I noted that she too would benefit from executing these strategies.  Progress: Lynn Gonzales continues to struggle with mild to moderate levels of depression, but she is managing to move forward and make independent decisions for her future.     Lynn Crochet, PhD

## 2021-04-06 ENCOUNTER — Ambulatory Visit (INDEPENDENT_AMBULATORY_CARE_PROVIDER_SITE_OTHER): Payer: BC Managed Care – PPO | Admitting: Psychology

## 2021-04-06 DIAGNOSIS — F330 Major depressive disorder, recurrent, mild: Secondary | ICD-10-CM | POA: Diagnosis not present

## 2021-04-06 NOTE — Progress Notes (Signed)
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 (Status: maintained) -- Skipping meals due to loss of appetite, or busy schedule  Depressed or irritable mood. (Status: maintained) -- Sad affect most of the time, easily losses patience  Diminished interest in or enjoyment of activities. (Status: maintained) -- Difficulties with motivation for normal everyday activities  Feelings of hopelessness, worthlessness, or inappropriate guilt. (Status: maintained) -- Familiy pressures making her feel guilty, disappointed in self  History of chronic or recurrent depression for which the client has taken antidepressant medication, been hospitalized, had outpatient treatment, or had a course of electroconvulsive therapy. (Status: maintained) -- Client and famiy describe long history of depression and previous suicide ideation and gestures  Lack of energy. (Status: maintained) -- Constant fatigue  Low self-esteem. (Status: maintained) -- No Description Entered  Poor concentration and indecisiveness. (Status: maintained) -- No Description Entered  Medication Status na  Safety none  If Suicidal or Homicidal State Action Taken: unspecified  Current Risk: low Medications unspecified Objectives Related Problem: Balance life activities between consideration of others and development of own interests. Description: Identify values that guide life's decisions and determine fulfillment. Target Date: 2021-06-19 Frequency: Weekly Modality: individual Progress: 20%  Related Problem: Balance life activities between consideration of others and development of own interests. Description: Implement new activities that increase a sense of satisfaction. Target Date: 2021-06-19 Frequency: Weekly Modality:  individual Progress: 20%  Related Problem: Balance life activities between consideration of others and development of own interests. Description: Implement increased assertiveness to take control of conflicts. Target Date: 2021-06-19 Frequency: Weekly Modality: individual Progress: 10%  Planned Intervention: Encourage the patient to use assertiveness skills and boundary setting application to the client's daily life.  Related Problem: Balance life activities between consideration of others and development of own interests. Description: Apply problem-solving skills to current circumstances. Target Date: 2021-06-19 Frequency: Weekly Modality: individual Progress: 10%  Related Problem: Balance life activities between consideration of others and development of own interests. Description: Increase communication with significant others regarding current life stress factors. Target Date: 2021-06-19 Frequency: Weekly Modality: individual Progress: 10%  Related Problem: Balance life activities between consideration of others and development of own interests. Description: Implement changes in time and effort allocation to restore balance to life. Target Date: 2021-06-19 Frequency: Weekly Modality: individual Progress: 0%  Related Problem: Balance life activities between consideration of others and development of own interests. Description: Increase social contacts to reduce sense of isolation. Target Date: 2021-06-19 Frequency: Weekly Modality: individual Progress: 0%  Related Problem: Balance life activities between consideration of others and development of own interests. Description: Share emotional struggles related to current adjustment stress. Target Date: 2021-06-19 Frequency: Weekly Modality: individual Progress: 60%  Related Problem: Balance life activities between consideration of others and development of own interests. Description: Significant others offer support to  reduce the client's stress. Target Date: 2021-06-19 Frequency: Weekly Modality: individual Progress: 10%  Related Problem: Balance life activities between consideration of others and development of own interests. Description: Describe the circumstances of life that are contributing to stress, anxiety, or lack of fulfillment. Target Date: 2021-06-19 Frequency: Weekly Modality: individual Progress: 50%  Related Problem: Balance life activities between consideration of others and development of own interests. Description: Increase activities that reinforce a positive self-identity. Target Date: 2021-06-19 Frequency: Weekly Modality: individual Progress: 30%  Related Problem: Develop healthy interpersonal relationships that lead to the alleviation and help prevent the relapse of depression. Description: Describe current and past experiences with depression including their impact on functioning and attempts to resolve it. Target Date: 2021-06-19 Frequency: Weekly Modality: individual Progress: 0%  Client Response full compliance  Service Location Location, [address] [place], [place] [address]  Service Code cpt [phone]) Clarify interpersonal incident (IPT)  Identify/label emotions  Validate/empathize   Communication analysis (IPT)  Rationally challenge thoughts or beliefs/cognitive restructuring  Identify automatic thoughts  Lifestyle change (exercise, nutrition)  Self care activities Grief and Loss Work Comments    Today I met with Lynn Gonzales in remote video (WebEx) face to face individual psychotherapy as l an accommodation during the pandemic.   Distance Site: Client's Home Originating Site: Dr Jannifer Franklin Remote Office Obtained consent to transmit remotely: Verbal  Pronouns: She/Her   Lynn Gonzales reports that she made the decision to trade in her car for a family model they could share.  She states that it opened the door to a serious discussion about  budgeting and sharing other responsibilities.  We d/e/p the way she/they are attempting to work together and strengthen their sense of "being in it together."  I provided some feedback and additional ideas for coming together on a more even playing ground which will hopefully elicit less defensiveness from Lynn Gonzales.  Lastly, Lynn Gonzales' sister is coming to town to move out her belongings.  She admits to being anxious about having to "chase after" her sister to make sure everything that needs to be attended to gets done.  I offered some suggestions for how to preemptively communicate her expectations to her sister and then enlist her husband as support during the process.  She agreed that this would be a good approach, would create a list and communicate with both her sister and her husband.    Progress: Lynn Gonzales continues to struggle with mild to moderate levels of depression, but she is managing to move forward and make independent decisions for her future.    [name], PhD

## 2021-04-09 ENCOUNTER — Ambulatory Visit: Payer: PRIVATE HEALTH INSURANCE | Admitting: Psychology

## 2021-04-14 ENCOUNTER — Ambulatory Visit (INDEPENDENT_AMBULATORY_CARE_PROVIDER_SITE_OTHER): Payer: BC Managed Care – PPO | Admitting: Psychology

## 2021-04-14 DIAGNOSIS — F330 Major depressive disorder, recurrent, mild: Secondary | ICD-10-CM

## 2021-04-14 NOTE — Progress Notes (Signed)
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 (Status: maintained) -- Skipping meals due to loss of appetite, or busy schedule  Depressed or irritable mood. (Status: maintained) -- Sad affect most of the time, easily losses patience  Diminished interest in or enjoyment of activities. (Status: maintained) -- Difficulties with motivation for normal everyday activities  Feelings of hopelessness, worthlessness, or inappropriate guilt. (Status: maintained) -- Familiy pressures making her feel guilty, disappointed in self  History of chronic or recurrent depression for which the client has taken antidepressant medication, been hospitalized, had outpatient treatment, or had a course of electroconvulsive therapy. (Status: maintained) -- Client and famiy describe long history of depression and previous suicide ideation and gestures  Lack of energy. (Status: maintained) -- Constant fatigue  Low self-esteem. (Status: maintained) -- No Description Entered  Poor concentration and indecisiveness. (Status: maintained) -- No Description Entered  Medication Status na  Safety none  If Suicidal or Homicidal State Action Taken: unspecified  Current Risk: low Medications unspecified Objectives Related Problem: Balance life activities between consideration of others and development of own interests. Description: Identify values that guide life's decisions and determine fulfillment. Target Date: 2021-06-19 Frequency: Weekly Modality: individual Progress: 20%  Related Problem: Balance life activities between consideration of others and development of own interests. Description: Implement new activities that increase a sense of satisfaction. Target Date: 2021-06-19 Frequency: Weekly Modality:  individual Progress: 20%  Related Problem: Balance life activities between consideration of others and development of own interests. Description: Implement increased assertiveness to take control of conflicts. Target Date: 2021-06-19 Frequency: Weekly Modality: individual Progress: 10%  Planned Intervention: Encourage the patient to use assertiveness skills and boundary setting application to the client's daily life.  Related Problem: Balance life activities between consideration of others and development of own interests. Description: Apply problem-solving skills to current circumstances. Target Date: 2021-06-19 Frequency: Weekly Modality: individual Progress: 10%  Related Problem: Balance life activities between consideration of others and development of own interests. Description: Increase communication with significant others regarding current life stress factors. Target Date: 2021-06-19 Frequency: Weekly Modality: individual Progress: 10%  Related Problem: Balance life activities between consideration of others and development of own interests. Description: Implement changes in time and effort allocation to restore balance to life. Target Date: 2021-06-19 Frequency: Weekly Modality: individual Progress: 0%  Related Problem: Balance life activities between consideration of others and development of own interests. Description: Increase social contacts to reduce sense of isolation. Target Date: 2021-06-19 Frequency: Weekly Modality: individual Progress: 0%  Related Problem: Balance life activities between consideration of others and development of own interests. Description: Share emotional struggles related to current adjustment stress. Target Date: 2021-06-19 Frequency: Weekly Modality: individual Progress: 60%  Related Problem: Balance life activities between consideration of others and development of own interests. Description: Significant others offer support to  reduce the client's stress. Target Date: 2021-06-19 Frequency: Weekly Modality: individual Progress: 10%  Related Problem: Balance life activities between consideration of others and development of own interests. Description: Describe the circumstances of life that are contributing to stress, anxiety, or lack of fulfillment. Target Date: 2021-06-19 Frequency: Weekly Modality: individual Progress: 50%  Related Problem: Balance life activities between consideration of others and development of own interests. Description: Increase activities that reinforce a positive self-identity. Target Date: 2021-06-19 Frequency: Weekly Modality: individual Progress: 30%  Related Problem: Develop healthy interpersonal relationships that lead to the alleviation and help prevent the relapse of depression. Description: Describe current and past experiences with depression including their impact on functioning and attempts to resolve it. Target Date: 2021-06-19 Frequency: Weekly Modality: individual Progress: 0%  Client Response full compliance  Service Location Location, [address] [place], [place] [address]  Service Code cpt [phone]) Clarify interpersonal incident (IPT)  Identify/label emotions  Validate/empathize   Communication analysis (IPT)  Rationally challenge thoughts or beliefs/cognitive restructuring  Identify automatic thoughts  Lifestyle change (exercise, nutrition)  Self care activities Grief and Loss Work Comments    Today I met with Lynn Gonzales in remote video (WebEx) face to face individual psychotherapy as l an accommodation during the pandemic.   Distance Site: Client's Home Originating Site: Dr Jannifer Franklin Remote Office Obtained consent to transmit remotely: Verbal  Pronouns: She/Her   Lynn Gonzales reports that she is still not feeling well.  She finds it's especially frustrating since she is talking for work all day.  We talked about what she might try to sooth  and rest her voice.  Lynn Gonzales shared a number of family situations she needed to d/e/p and have her feelings validated.  Lynn Gonzales went to her PCP for a f/u related to an related concern to her miscarriage.  When they acknowledged her loss, she realized that it's only been a month since she miscarried.  We d/e/p her experience of grief.   Progress: Lynn Gonzales continues to struggle with mild to moderate levels of depression, but she is managing to move forward and make independent decisions for her future.    [name], PhD                [name], PhD

## 2021-04-16 ENCOUNTER — Ambulatory Visit (INDEPENDENT_AMBULATORY_CARE_PROVIDER_SITE_OTHER): Payer: BC Managed Care – PPO | Admitting: Psychology

## 2021-04-16 DIAGNOSIS — F330 Major depressive disorder, recurrent, mild: Secondary | ICD-10-CM

## 2021-04-16 NOTE — Progress Notes (Signed)
?PROGRESS NOTES: ? ?Date of Service: 04/16/2021 ?Start Time: 11:00 AM ?Stop Time: 11:55 AM ? 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 (Status: maintained) -- Skipping meals due to loss of appetite, or busy schedule  ?Depressed or irritable mood. (Status: maintained) -- Sad affect most of the time, easily losses patience  ?Diminished interest in or enjoyment of activities. (Status: maintained) -- Difficulties with motivation for normal everyday activities  ?Feelings of hopelessness, worthlessness, or inappropriate guilt. (Status: maintained) -- Familiy pressures making her feel guilty, disappointed in self  ?History of chronic or recurrent depression for which the client has taken antidepressant medication, been hospitalized, had outpatient treatment, or had a course of electroconvulsive therapy. (Status: maintained) -- Client and famiy describe long history of depression and previous suicide ideation and gestures  ?Lack of energy. (Status: maintained) -- Constant fatigue  ?Low self-esteem. (Status: maintained) -- No Description Entered  ?Poor concentration and indecisiveness. (Status: maintained) -- No Description Entered  ?Medication Status ?na  ?Safety ?none  ?If Suicidal or Homicidal State Action Taken: unspecified  ?Current Risk: low ?Medications ?unspecified ?Objectives ?Related Problem: Balance life activities between consideration of others and development of own interests. ?Description: Identify values that guide life's decisions and determine fulfillment. ?Target Date: 2021-06-19 ?Frequency: Weekly ?Modality: individual ?Progress: 20%  ?Related Problem: Balance life activities between consideration of others and development of own interests. ?Description: Implement new activities that increase a sense of satisfaction. ?Target Date: 2021-06-19 ?Frequency: Weekly ?Modality:  individual ?Progress: 20%  ?Related Problem: Balance life activities between consideration of others and development of own interests. ?Description: Implement increased assertiveness to take control of conflicts. ?Target Date: 2021-06-19 ?Frequency: Weekly ?Modality: individual ?Progress: 10% ? ?Planned Intervention: Encourage the patient to use assertiveness skills and boundary setting application to the client's daily life.  ?Related Problem: Balance life activities between consideration of others and development of own interests. ?Description: Apply problem-solving skills to current circumstances. ?Target Date: 2021-06-19 ?Frequency: Weekly ?Modality: individual ?Progress: 10% ? ?Related Problem: Balance life activities between consideration of others and development of own interests. ?Description: Increase communication with significant others regarding current life stress factors. ?Target Date: 2021-06-19 ?Frequency: Weekly ?Modality: individual ?Progress: 10% ? ?Related Problem: Balance life activities between consideration of others and development of own interests. ?Description: Implement changes in time and effort allocation to restore balance to life. ?Target Date: 2021-06-19 ?Frequency: Weekly ?Modality: individual ?Progress: 0%  ?Related Problem: Balance life activities between consideration of others and development of own interests. ?Description: Increase social contacts to reduce sense of isolation. ?Target Date: 2021-06-19 ?Frequency: Weekly ?Modality: individual ?Progress: 0%  ?Related Problem: Balance life activities between consideration of others and development of own interests. ?Description: Share emotional struggles related to current adjustment stress. ?Target Date: 2021-06-19 ?Frequency: Weekly ?Modality: individual ?Progress: 60%  ?Related Problem: Balance life activities between consideration of others and development of own interests. ?Description: Significant others offer support to  reduce the client's stress. ?Target Date: 2021-06-19 ?Frequency: Weekly ?Modality: individual ?Progress: 10%  ?Related Problem: Balance life activities between consideration of others and development of own interests. ?Description: Describe the circumstances of life that are contributing to stress, anxiety, or lack of fulfillment. ?Target Date: 2021-06-19 ?Frequency: Weekly ?Modality: individual ?Progress: 50% ? ?Related Problem: Balance life activities between consideration of others and development of own interests. ?Description: Increase activities that reinforce a positive self-identity. ?Target Date: 2021-06-19 ?Frequency: Weekly ?Modality: individual ?Progress: 30%  ?  Related Problem: Develop healthy interpersonal relationships that lead to the alleviation and help prevent the relapse of depression. ?Description: Describe current and past experiences with depression including their impact on functioning and attempts to resolve it. ?Target Date: 2021-06-19 ?Frequency: Weekly ?Modality: individual ?Progress: 0% ? ?Client Response ?full compliance  ?Service Location ?Location, [address] [place], [place] [address]  ?Service Code ?cpt 19147W (95) ?Clarify interpersonal incident (IPT)  ?Identify/label emotions  ?Validate/empathize   ?Communication analysis (IPT)  ?Rationally challenge thoughts or beliefs/cognitive restructuring  ?Identify automatic thoughts  ?Lifestyle change (exercise, nutrition)  ?Self care activities ?Grief and Loss Work ?Comments  ? ? ?Today I met with [name] in remote video (WebEx) face to face individual psychotherapy as l an accommodation during the pandemic.  ? ?Distance Site: Client's Home ?Originating Site: Dr Jannifer Franklin Remote Office ?Obtained consent to transmit remotely: Verbal ? ?Pronouns: She/Her  ? ?Shayra reports that she had a very emotional meeting with her boss.  She removed a big contract to a co-worker and it set a number of things in motion.  We d/e/p what  occurred, how she responded and implications for her position.  I validated her experience and we p/s next steps. ? ?I d/e whether Janalynn needed to reach out to her PCP for a medication evaluation.  I assessed that her depression was mild and she felt like she was still getting done what she needs to do.  We reviewed her "red flag" symptoms and Ski agreed to track her mood. ? ?Progress: Sherisa continues to struggle with mild to moderate levels of depression, but she is managing to move forward and make independent decisions for her future.  ? ? ?[name], PhD ? ? ? ? ? ? ? ? ? ? ? ? ? ? ? ?[name], PhD ?

## 2021-04-20 ENCOUNTER — Ambulatory Visit (INDEPENDENT_AMBULATORY_CARE_PROVIDER_SITE_OTHER): Payer: BC Managed Care – PPO | Admitting: Psychology

## 2021-04-20 DIAGNOSIS — F330 Major depressive disorder, recurrent, mild: Secondary | ICD-10-CM | POA: Diagnosis not present

## 2021-04-20 NOTE — Progress Notes (Addendum)
?PROGRESS NOTES: ? ?Date of Service: 04/20/2021 ?Start Time: 10:00 AM ?Stop Time: 10:55 AM ? 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 (Status: maintained) -- Skipping meals due to loss of appetite, or busy schedule  ?Depressed or irritable mood. (Status: maintained) -- Sad affect most of the time, easily losses patience  ?Diminished interest in or enjoyment of activities. (Status: maintained) -- Difficulties with motivation for normal everyday activities  ?Feelings of hopelessness, worthlessness, or inappropriate guilt. (Status: maintained) -- Familiy pressures making her feel guilty, disappointed in self  ?History of chronic or recurrent depression for which the client has taken antidepressant medication, been hospitalized, had outpatient treatment, or had a course of electroconvulsive therapy. (Status: maintained) -- Client and famiy describe long history of depression and previous suicide ideation and gestures  ?Lack of energy. (Status: maintained) -- Constant fatigue  ?Low self-esteem. (Status: maintained) -- No Description Entered  ?Poor concentration and indecisiveness. (Status: maintained) -- No Description Entered  ?Medication Status ?na  ?Safety ?none  ?If Suicidal or Homicidal State Action Taken: unspecified  ?Current Risk: low ?Medications ?unspecified ?Objectives ?Related Problem: Balance life activities between consideration of others and development of own interests. ?Description: Identify values that guide life's decisions and determine fulfillment. ?Target Date: 2021-06-19 ?Frequency: Weekly ?Modality: individual ?Progress: 20%  ?Related Problem: Balance life activities between consideration of others and development of own interests. ?Description: Implement new activities that increase a sense of satisfaction. ?Target Date: 2021-06-19 ?Frequency: Weekly ?Modality:  individual ?Progress: 20%  ?Related Problem: Balance life activities between consideration of others and development of own interests. ?Description: Implement increased assertiveness to take control of conflicts. ?Target Date: 2021-06-19 ?Frequency: Weekly ?Modality: individual ?Progress: 10% ? ?Planned Intervention: Encourage the patient to use assertiveness skills and boundary setting application to the client's daily life.  ?Related Problem: Balance life activities between consideration of others and development of own interests. ?Description: Apply problem-solving skills to current circumstances. ?Target Date: 2021-06-19 ?Frequency: Weekly ?Modality: individual ?Progress: 10% ? ?Related Problem: Balance life activities between consideration of others and development of own interests. ?Description: Increase communication with significant others regarding current life stress factors. ?Target Date: 2021-06-19 ?Frequency: Weekly ?Modality: individual ?Progress: 10% ? ?Related Problem: Balance life activities between consideration of others and development of own interests. ?Description: Implement changes in time and effort allocation to restore balance to life. ?Target Date: 2021-06-19 ?Frequency: Weekly ?Modality: individual ?Progress: 0%  ?Related Problem: Balance life activities between consideration of others and development of own interests. ?Description: Increase social contacts to reduce sense of isolation. ?Target Date: 2021-06-19 ?Frequency: Weekly ?Modality: individual ?Progress: 0%  ?Related Problem: Balance life activities between consideration of others and development of own interests. ?Description: Share emotional struggles related to current adjustment stress. ?Target Date: 2021-06-19 ?Frequency: Weekly ?Modality: individual ?Progress: 60%  ?Related Problem: Balance life activities between consideration of others and development of own interests. ?Description: Significant others offer support to  reduce the client's stress. ?Target Date: 2021-06-19 ?Frequency: Weekly ?Modality: individual ?Progress: 10%  ?Related Problem: Balance life activities between consideration of others and development of own interests. ?Description: Describe the circumstances of life that are contributing to stress, anxiety, or lack of fulfillment. ?Target Date: 2021-06-19 ?Frequency: Weekly ?Modality: individual ?Progress: 50% ? ?Related Problem: Balance life activities between consideration of others and development of own interests. ?Description: Increase activities that reinforce a positive self-identity. ?Target Date: 2021-06-19 ?Frequency: Weekly ?Modality: individual ?Progress: 30%  ?  Related Problem: Develop healthy interpersonal relationships that lead to the alleviation and help prevent the relapse of depression. ?Description: Describe current and past experiences with depression including their impact on functioning and attempts to resolve it. ?Target Date: 2021-06-19 ?Frequency: Weekly ?Modality: individual ?Progress: 0% ? ?Client Response ?full compliance  ?Service Location ?Location, [address] [place], [place] [address]  ?Service Code ?cpt 55974B (95) ?Clarify interpersonal incident (IPT)  ?Identify/label emotions  ?Validate/empathize   ?Communication analysis (IPT)  ?Rationally challenge thoughts or beliefs/cognitive restructuring  ?Identify automatic thoughts  ?Lifestyle change (exercise, nutrition)  ?Self care activities ?Grief and Loss Work ?Comments  ? ? ?Today I met with Lynn Gonzales in remote video (WebEx) face to face individual psychotherapy as l an accommodation during the pandemic.  ? ?Distance Site: Client's Home ?Originating Site: Dr Jannifer Franklin Remote Office ?Obtained consent to transmit remotely: Verbal ? ?Pronouns: She/Her  ? ?Lynn Gonzales reports that she had an important meeting today.  We d/e/p what was said and that she was reassured by the end of the meeting.  She sued the remainder of the session  to d/ that her family has been giving her attitude about her "being depressed."  I validated her right to feel depressed after the loss of her baby and all the other stresses she has had to deal with this past month.  We d/ how she might respond to these comments and feel free to be where she's at. ? ? ?Progress: Lynn Gonzales continues to struggle with mild to moderate levels of depression, but she is managing to move forward and make independent decisions for her future.  ? ? ?Lynn Crochet, PhD ? ? ? ? ?

## 2021-04-23 ENCOUNTER — Ambulatory Visit (INDEPENDENT_AMBULATORY_CARE_PROVIDER_SITE_OTHER): Payer: BC Managed Care – PPO | Admitting: Psychology

## 2021-04-23 DIAGNOSIS — F330 Major depressive disorder, recurrent, mild: Secondary | ICD-10-CM

## 2021-04-23 NOTE — Progress Notes (Signed)
[place]  ? ?PROGRESS NOTE: ? ?Name: Lynn Gonzales ?Date: 04/23/2021 ?MRN: [id number] ?DOB: Mar 04, 1995 ?PCP: Pcp, No ? ? ?Date of Service: 04/20/2021 ?Start Time: 10:00 AM ?Stop Time: 10:55 AM ? 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 (Status: maintained) -- Skipping meals due to loss of appetite, or busy schedule  ?Depressed or irritable mood. (Status: maintained) -- Sad affect most of the time, easily losses patience  ?Diminished interest in or enjoyment of activities. (Status: maintained) -- Difficulties with motivation for normal everyday activities  ?Feelings of hopelessness, worthlessness, or inappropriate guilt. (Status: maintained) -- Familiy pressures making her feel guilty, disappointed in self  ?History of chronic or recurrent depression for which the client has taken antidepressant medication, been hospitalized, had outpatient treatment, or had a course of electroconvulsive therapy. (Status: maintained) -- Client and famiy describe long history of depression and previous suicide ideation and gestures  ?Lack of energy. (Status: maintained) -- Constant fatigue  ?Low self-esteem. (Status: maintained) -- No Description Entered  ?Poor concentration and indecisiveness. (Status: maintained) -- No Description Entered  ?Medication Status ?na  ?Safety ?none  ?If Suicidal or Homicidal State Action Taken: unspecified  ?Current Risk: low ?Medications ?unspecified ?Objectives ?Related Problem: Balance life activities between consideration of others and development of own interests. ?Description: Identify values that guide life's decisions and determine fulfillment. ?Target Date: 2021-06-19 ?Frequency: Weekly ?Modality: individual ?Progress: 20%  ?Related Problem: Balance life activities between consideration of others and development of own interests. ?Description:  Implement new activities that increase a sense of satisfaction. ?Target Date: 2021-06-19 ?Frequency: Weekly ?Modality: individual ?Progress: 20%  ?Related Problem: Balance life activities between consideration of others and development of own interests. ?Description: Implement increased assertiveness to take control of conflicts. ?Target Date: 2021-06-19 ?Frequency: Weekly ?Modality: individual ?Progress: 10% ? ?Planned Intervention: Encourage the patient to use assertiveness skills and boundary setting application to the client's daily life.  ?Related Problem: Balance life activities between consideration of others and development of own interests. ?Description: Apply problem-solving skills to current circumstances. ?Target Date: 2021-06-19 ?Frequency: Weekly ?Modality: individual ?Progress: 10% ? ?Related Problem: Balance life activities between consideration of others and development of own interests. ?Description: Increase communication with significant others regarding current life stress factors. ?Target Date: 2021-06-19 ?Frequency: Weekly ?Modality: individual ?Progress: 10% ? ?Related Problem: Balance life activities between consideration of others and development of own interests. ?Description: Implement changes in time and effort allocation to restore balance to life. ?Target Date: 2021-06-19 ?Frequency: Weekly ?Modality: individual ?Progress: 0%  ?Related Problem: Balance life activities between consideration of others and development of own interests. ?Description: Increase social contacts to reduce sense of isolation. ?Target Date: 2021-06-19 ?Frequency: Weekly ?Modality: individual ?Progress: 0%  ?Related Problem: Balance life activities between consideration of others and development of own interests. ?Description: Share emotional struggles related to current adjustment stress. ?Target Date: 2021-06-19 ?Frequency: Weekly ?Modality: individual ?Progress: 60%  ?Related Problem: Balance life activities  between consideration of others and development of own interests. ?Description: Significant others offer support to reduce the client's stress. ?Target Date: 2021-06-19 ?Frequency: Weekly ?Modality: individual ?Progress: 10%  ?Related Problem: Balance life activities between consideration of others and development of own interests. ?Description: Describe the circumstances of life that are contributing to stress, anxiety, or lack of fulfillment. ?Target Date: 2021-06-19 ?Frequency: Weekly ?Modality: individual ?Progress: 50% ? ?Related Problem: Balance life activities between consideration of others and development of own  interests. ?Description: Increase activities that reinforce a positive self-identity. ?Target Date: 2021-06-19 ?Frequency: Weekly ?Modality: individual ?Progress: 30%  ?Related Problem: Develop healthy interpersonal relationships that lead to the alleviation and help prevent the relapse of depression. ?Description: Describe current and past experiences with depression including their impact on functioning and attempts to resolve it. ?Target Date: 2021-06-19 ?Frequency: Weekly ?Modality: individual ?Progress: 0% ? ?Client Response ?full compliance  ?Service Location ?Location, [address] [place], [place] [address]  ?Service Code ?cpt 96116I (95) ?Clarify interpersonal incident (IPT)  ?Identify/label emotions  ?Validate/empathize   ?Communication analysis (IPT)  ?Rationally challenge thoughts or beliefs/cognitive restructuring  ?Identify automatic thoughts  ?Lifestyle change (exercise, nutrition)  ?Self care activities ?Grief and Loss Work ?Comments  ? ? ?Today I met with Lynn Gonzales in remote video (WebEx) face to face individual psychotherapy as an accommodation during the pandemic.  ? ?Distance Site: Client's Home ?Originating Site: Dr Jannifer Franklin Remote Office ?Obtained consent to transmit remotely: Verbal ? ?Pronouns: She/Her  ? ?Lynn Gonzales reports that they are changing day care centers  after f/u with a recommendation made in therapy.  Last week, she mentioned a concern about her toddler hitting.  I had encouraged her to observe the classroom (remote camera) and ask the teacher about other children's hitting behaviors.  We d/e/p her concerns, issues with the center and her action plan. ? ?Lynn Gonzales states that she had a privacy issue with her in-laws.  We d/e/p the issues.  I encouraged her to send a communication that is balanced (appreciation + limit). ? ? ?Progress: Pt's mood remains stable at mild to moderate levels of depression, but she is managing to move forward and make independent decisions for her future.   ? ? ?Lynn Crochet, PhD ? ? ? ? ? ? ?

## 2021-04-27 ENCOUNTER — Ambulatory Visit (INDEPENDENT_AMBULATORY_CARE_PROVIDER_SITE_OTHER): Payer: BC Managed Care – PPO | Admitting: Psychology

## 2021-04-27 DIAGNOSIS — F330 Major depressive disorder, recurrent, mild: Secondary | ICD-10-CM

## 2021-04-27 NOTE — Progress Notes (Signed)
[place]  ? ?PROGRESS NOTE: ? ?Name: Lynn Gonzales ?Date: 04/27/2021 ?MRN: [id number] ?DOB: 12-Jul-1995 ?PCP: Pcp, No ? ? ?Start Time: 10:00 AM ?Stop Time: 10:55 AM ? 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 (Status: maintained) -- Skipping meals due to loss of appetite, or busy schedule  ?Depressed or irritable mood. (Status: maintained) -- Sad affect most of the time, easily losses patience  ?Diminished interest in or enjoyment of activities. (Status: maintained) -- Difficulties with motivation for normal everyday activities  ?Feelings of hopelessness, worthlessness, or inappropriate guilt. (Status: maintained) -- Familiy pressures making her feel guilty, disappointed in self  ?History of chronic or recurrent depression for which the client has taken antidepressant medication, been hospitalized, had outpatient treatment, or had a course of electroconvulsive therapy. (Status: maintained) -- Client and famiy describe long history of depression and previous suicide ideation and gestures  ?Lack of energy. (Status: maintained) -- Constant fatigue  ?Low self-esteem. (Status: maintained) -- No Description Entered  ?Poor concentration and indecisiveness. (Status: maintained) -- No Description Entered  ?Medication Status ?na  ?Safety ?none  ?If Suicidal or Homicidal State Action Taken: unspecified  ?Current Risk: low ?Medications ?unspecified ?Objectives ?Related Problem: Balance life activities between consideration of others and development of own interests. ?Description: Identify values that guide life's decisions and determine fulfillment. ?Target Date: 2021-06-19 ?Frequency: Weekly ?Modality: individual ?Progress: 20%  ?Related Problem: Balance life activities between consideration of others and development of own interests. ?Description: Implement new activities that  increase a sense of satisfaction. ?Target Date: 2021-06-19 ?Frequency: Weekly ?Modality: individual ?Progress: 20%  ?Related Problem: Balance life activities between consideration of others and development of own interests. ?Description: Implement increased assertiveness to take control of conflicts. ?Target Date: 2021-06-19 ?Frequency: Weekly ?Modality: individual ?Progress: 10% ? ?Planned Intervention: Encourage the patient to use assertiveness skills and boundary setting application to the client's daily life.  ?Related Problem: Balance life activities between consideration of others and development of own interests. ?Description: Apply problem-solving skills to current circumstances. ?Target Date: 2021-06-19 ?Frequency: Weekly ?Modality: individual ?Progress: 10% ? ?Related Problem: Balance life activities between consideration of others and development of own interests. ?Description: Increase communication with significant others regarding current life stress factors. ?Target Date: 2021-06-19 ?Frequency: Weekly ?Modality: individual ?Progress: 10% ? ?Related Problem: Balance life activities between consideration of others and development of own interests. ?Description: Implement changes in time and effort allocation to restore balance to life. ?Target Date: 2021-06-19 ?Frequency: Weekly ?Modality: individual ?Progress: 0%  ?Related Problem: Balance life activities between consideration of others and development of own interests. ?Description: Increase social contacts to reduce sense of isolation. ?Target Date: 2021-06-19 ?Frequency: Weekly ?Modality: individual ?Progress: 0%  ?Related Problem: Balance life activities between consideration of others and development of own interests. ?Description: Share emotional struggles related to current adjustment stress. ?Target Date: 2021-06-19 ?Frequency: Weekly ?Modality: individual ?Progress: 60%  ?Related Problem: Balance life activities between consideration of others  and development of own interests. ?Description: Significant others offer support to reduce the client's stress. ?Target Date: 2021-06-19 ?Frequency: Weekly ?Modality: individual ?Progress: 10%  ?Related Problem: Balance life activities between consideration of others and development of own interests. ?Description: Describe the circumstances of life that are contributing to stress, anxiety, or lack of fulfillment. ?Target Date: 2021-06-19 ?Frequency: Weekly ?Modality: individual ?Progress: 50% ? ?Related Problem: Balance life activities between consideration of others and development of own interests. ?Description: Increase activities  that reinforce a positive self-identity. ?Target Date: 2021-06-19 ?Frequency: Weekly ?Modality: individual ?Progress: 30%  ?Related Problem: Develop healthy interpersonal relationships that lead to the alleviation and help prevent the relapse of depression. ?Description: Describe current and past experiences with depression including their impact on functioning and attempts to resolve it. ?Target Date: 2021-06-19 ?Frequency: Weekly ?Modality: individual ?Progress: 0% ? ?Client Response ?full compliance  ?Service Location ?Location, [address] [place], [place] [address]  ?Service Code ?cpt 15806B (95) ?Clarify interpersonal incident (IPT)  ?Identify/label emotions  ?Validate/empathize   ?Communication analysis (IPT)  ?Rationally challenge thoughts or beliefs/cognitive restructuring  ?Identify automatic thoughts  ?Lifestyle change (exercise, nutrition)  ?Self care activities ?Grief and Loss Work ?Comments  ? ? ?Today I met with [name] in remote video (WebEx) face to face individual psychotherapy as an accommodation during the pandemic.  ? ?Distance Site: Client's Home ?Originating Site: Dr Jannifer Franklin Remote Office ?Obtained consent to transmit remotely: Verbal ? ?Pronouns: She/Her  ? ?[name] reports that it was a tough week related to the loss of her pregnancy.  It seemed  everywhere she turned, people were talking about abortion and miscarrying.  We d/e/p what she was going through.  Ethelean states that she had some good d/ with her mother this past weekend.  Her mother opened up about some difficulties she was having and that allowed [place] to speak with her more freely.  She, [name] and the baby picked up flowers and went to spend the day with her which went meant a lot to her and helped them to work through some of their recent difference. ? ? ? ?Progress: Pt's mood remains stable at mild to moderate levels of depression, but she is managing to move forward and make independent decisions for her future.   ? ? ?[name], PhD ? ? ?

## 2021-04-30 ENCOUNTER — Ambulatory Visit (INDEPENDENT_AMBULATORY_CARE_PROVIDER_SITE_OTHER): Payer: BC Managed Care – PPO | Admitting: Psychology

## 2021-04-30 DIAGNOSIS — F330 Major depressive disorder, recurrent, mild: Secondary | ICD-10-CM

## 2021-04-30 NOTE — Progress Notes (Signed)
? ?[place]  ? ?PROGRESS NOTE: ? ?Name: Lynn Gonzales ?Date: 04/30/2021 ?MRN: [id number] ?DOB: 1995/08/07 ?PCP: Pcp, No ? ? ?Start Time: 11:00 AM ?Stop Time: 11:55 AM ? 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 (Status: maintained) -- Skipping meals due to loss of appetite, or busy schedule  ?Depressed or irritable mood. (Status: maintained) -- Sad affect most of the time, easily losses patience  ?Diminished interest in or enjoyment of activities. (Status: maintained) -- Difficulties with motivation for normal everyday activities  ?Feelings of hopelessness, worthlessness, or inappropriate guilt. (Status: maintained) -- Familiy pressures making her feel guilty, disappointed in self  ?History of chronic or recurrent depression for which the client has taken antidepressant medication, been hospitalized, had outpatient treatment, or had a course of electroconvulsive therapy. (Status: maintained) -- Client and famiy describe long history of depression and previous suicide ideation and gestures  ?Lack of energy. (Status: maintained) -- Constant fatigue  ?Low self-esteem. (Status: maintained) -- No Description Entered  ?Poor concentration and indecisiveness. (Status: maintained) -- No Description Entered  ?Medication Status ?na  ?Safety ?none  ?If Suicidal or Homicidal State Action Taken: unspecified  ?Current Risk: low ?Medications ?unspecified ?Objectives ?Related Problem: Balance life activities between consideration of others and development of own interests. ?Description: Identify values that guide life's decisions and determine fulfillment. ?Target Date: 2021-06-19 ?Frequency: Weekly ?Modality: individual ?Progress: 20%  ?Related Problem: Balance life activities between consideration of others and development of own interests. ?Description: Implement new activities  that increase a sense of satisfaction. ?Target Date: 2021-06-19 ?Frequency: Weekly ?Modality: individual ?Progress: 20%  ?Related Problem: Balance life activities between consideration of others and development of own interests. ?Description: Implement increased assertiveness to take control of conflicts. ?Target Date: 2021-06-19 ?Frequency: Weekly ?Modality: individual ?Progress: 10% ? ?Planned Intervention: Encourage the patient to use assertiveness skills and boundary setting application to the client's daily life.  ?Related Problem: Balance life activities between consideration of others and development of own interests. ?Description: Apply problem-solving skills to current circumstances. ?Target Date: 2021-06-19 ?Frequency: Weekly ?Modality: individual ?Progress: 10% ? ?Related Problem: Balance life activities between consideration of others and development of own interests. ?Description: Increase communication with significant others regarding current life stress factors. ?Target Date: 2021-06-19 ?Frequency: Weekly ?Modality: individual ?Progress: 10% ? ?Related Problem: Balance life activities between consideration of others and development of own interests. ?Description: Implement changes in time and effort allocation to restore balance to life. ?Target Date: 2021-06-19 ?Frequency: Weekly ?Modality: individual ?Progress: 0%  ?Related Problem: Balance life activities between consideration of others and development of own interests. ?Description: Increase social contacts to reduce sense of isolation. ?Target Date: 2021-06-19 ?Frequency: Weekly ?Modality: individual ?Progress: 0%  ?Related Problem: Balance life activities between consideration of others and development of own interests. ?Description: Share emotional struggles related to current adjustment stress. ?Target Date: 2021-06-19 ?Frequency: Weekly ?Modality: individual ?Progress: 60%  ?Related Problem: Balance life activities between consideration of  others and development of own interests. ?Description: Significant others offer support to reduce the client's stress. ?Target Date: 2021-06-19 ?Frequency: Weekly ?Modality: individual ?Progress: 10%  ?Related Problem: Balance life activities between consideration of others and development of own interests. ?Description: Describe the circumstances of life that are contributing to stress, anxiety, or lack of fulfillment. ?Target Date: 2021-06-19 ?Frequency: Weekly ?Modality: individual ?Progress: 50% ? ?Related Problem: Balance life activities between consideration of others and development of own interests. ?Description: Increase  activities that reinforce a positive self-identity. ?Target Date: 2021-06-19 ?Frequency: Weekly ?Modality: individual ?Progress: 30%  ?Related Problem: Develop healthy interpersonal relationships that lead to the alleviation and help prevent the relapse of depression. ?Description: Describe current and past experiences with depression including their impact on functioning and attempts to resolve it. ?Target Date: 2021-06-19 ?Frequency: Weekly ?Modality: individual ?Progress: 0% ? ?Client Response ?full compliance  ?Service Location ?Location, [address] [place], [place] [address]  ?Service Code ?cpt 90837P (95) ?Clarify interpersonal incident (IPT)  ?Identify/label emotions  ?Validate/empathize   ?Communication analysis (IPT)  ?Rationally challenge thoughts or beliefs/cognitive restructuring  ?Identify automatic thoughts  ?Lifestyle change (exercise, nutrition)  ?Self care activities ?Grief and Loss Work ?Comments  ? ? ?Today I met with Lynn Gonzales in remote video (WebEx) face to face individual psychotherapy as an accommodation during the pandemic.  ? ?Distance Site: Client's Home ?Originating Site: Dr Gonzales's Remote Office ?Obtained consent to transmit remotely: Verbal ? ?Pronouns: She/Her  ? ?Lynn Gonzales reports that she will be traveling to [place] with her mother to celebrate  her grandmother's 93rd birthday.  We d/p that it will be a good opportunity to spend time with each other and mend some sore feelings.   ? ?Lynn Gonzales is experiencing work stress.  We d/e/p what the issues are, how she is responding and p/s an action plan.   ? ? ?Progress: Pt's mood remains stable at mild to moderate levels of depression, but she is managing to move forward and make independent decisions for her future.   ? ? ?Lynn Ann Garcia, PhD ? ?

## 2021-05-04 ENCOUNTER — Ambulatory Visit (INDEPENDENT_AMBULATORY_CARE_PROVIDER_SITE_OTHER): Payer: BC Managed Care – PPO | Admitting: Psychology

## 2021-05-04 DIAGNOSIS — F330 Major depressive disorder, recurrent, mild: Secondary | ICD-10-CM | POA: Diagnosis not present

## 2021-05-04 NOTE — Progress Notes (Signed)
? ?PROGRESS NOTE: ? ?Name: Lynn Gonzales ?Date: 05/04/2021 ?MRN: [id number] ?DOB: [birth year] ?PCP: Pcp, No ? ? ?Start Time: 10:00 AM ?Stop Time: 10:55 AM ? 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 (Status: maintained) -- Skipping meals due to loss of appetite, or busy schedule  ?Depressed or irritable mood. (Status: maintained) -- Sad affect most of the time, easily losses patience  ?Diminished interest in or enjoyment of activities. (Status: maintained) -- Difficulties with motivation for normal everyday activities  ?Feelings of hopelessness, worthlessness, or inappropriate guilt. (Status: maintained) -- Familiy pressures making her feel guilty, disappointed in self  ?History of chronic or recurrent depression for which the client has taken antidepressant medication, been hospitalized, had outpatient treatment, or had a course of electroconvulsive therapy. (Status: maintained) -- Client and famiy describe long history of depression and previous suicide ideation and gestures  ?Lack of energy. (Status: maintained) -- Constant fatigue  ?Low self-esteem. (Status: maintained) -- No Description Entered  ?Poor concentration and indecisiveness. (Status: maintained) -- No Description Entered  ?Medication Status ?na  ?Safety ?none  ?If Suicidal or Homicidal State Action Taken: unspecified  ?Current Risk: low ?Medications ?unspecified ?Objectives ?Related Problem: Balance life activities between consideration of others and development of own interests. ?Description: Identify values that guide life's decisions and determine fulfillment. ?Target Date: 2021-06-19 ?Frequency: Weekly ?Modality: individual ?Progress: 20%  ?Related Problem: Balance life activities between consideration of others and development of own interests. ?Description: Implement new activities that increase a sense of  satisfaction. ?Target Date: 2021-06-19 ?Frequency: Weekly ?Modality: individual ?Progress: 20%  ?Related Problem: Balance life activities between consideration of others and development of own interests. ?Description: Implement increased assertiveness to take control of conflicts. ?Target Date: 2021-06-19 ?Frequency: Weekly ?Modality: individual ?Progress: 10% ? ?Planned Intervention: Encourage the patient to use assertiveness skills and boundary setting application to the client's daily life.  ?Related Problem: Balance life activities between consideration of others and development of own interests. ?Description: Apply problem-solving skills to current circumstances. ?Target Date: 2021-06-19 ?Frequency: Weekly ?Modality: individual ?Progress: 10% ? ?Related Problem: Balance life activities between consideration of others and development of own interests. ?Description: Increase communication with significant others regarding current life stress factors. ?Target Date: 2021-06-19 ?Frequency: Weekly ?Modality: individual ?Progress: 10% ? ?Related Problem: Balance life activities between consideration of others and development of own interests. ?Description: Implement changes in time and effort allocation to restore balance to life. ?Target Date: 2021-06-19 ?Frequency: Weekly ?Modality: individual ?Progress: 0%  ?Related Problem: Balance life activities between consideration of others and development of own interests. ?Description: Increase social contacts to reduce sense of isolation. ?Target Date: 2021-06-19 ?Frequency: Weekly ?Modality: individual ?Progress: 0%  ?Related Problem: Balance life activities between consideration of others and development of own interests. ?Description: Share emotional struggles related to current adjustment stress. ?Target Date: 2021-06-19 ?Frequency: Weekly ?Modality: individual ?Progress: 60%  ?Related Problem: Balance life activities between consideration of others and development of  own interests. ?Description: Significant others offer support to reduce the client's stress. ?Target Date: 2021-06-19 ?Frequency: Weekly ?Modality: individual ?Progress: 10%  ?Related Problem: Balance life activities between consideration of others and development of own interests. ?Description: Describe the circumstances of life that are contributing to stress, anxiety, or lack of fulfillment. ?Target Date: 2021-06-19 ?Frequency: Weekly ?Modality: individual ?Progress: 50% ? ?Related Problem: Balance life activities between consideration of others and development of own interests. ?Description: Increase activities that reinforce a positive  self-identity. ?Target Date: 2021-06-19 ?Frequency: Weekly ?Modality: individual ?Progress: 30%  ?Related Problem: Develop healthy interpersonal relationships that lead to the alleviation and help prevent the relapse of depression. ?Description: Describe current and past experiences with depression including their impact on functioning and attempts to resolve it. ?Target Date: 2021-06-19 ?Frequency: Weekly ?Modality: individual ?Progress: 0% ? ?Client Response ?full compliance  ?Service Location ?Location, [address] [place], [place] [address]  ?Service Code ?cpt 91368Z (95) ?Clarify interpersonal incident (IPT)  ?Identify/label emotions  ?Validate/empathize   ?Communication analysis (IPT)  ?Rationally challenge thoughts or beliefs/cognitive restructuring  ?Identify automatic thoughts  ?Lifestyle change (exercise, nutrition)  ?Self care activities ?Grief and Loss Work ?Comments  ? ? ?Today I met with Lynn Gonzales in remote video (WebEx) face to face individual psychotherapy as an accommodation during the pandemic.  ? ?Distance Site: Client's Home ?Originating Site: Dr Jannifer Franklin Remote Office ?Obtained consent to transmit remotely: Verbal ? ?Pronouns: She/Her  ? ?Lynn Gonzales reports that her work stress continues to be high.  We d/e/p what the current issues are, how she is  responding and p/s an action plan.  She was however reassured when she asked for mentoring and was met with a positive response.  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  I encouraged Lynn Gonzales to spend some time consider future work goals for herself. ? ? ?Progress: Pt's mood remains stable at mild to moderate levels of depression, but she is managing to move forward and make independent decisions for her future.   ? ? ?Lynn Crochet, PhD ? ? ?

## 2021-05-07 ENCOUNTER — Ambulatory Visit (INDEPENDENT_AMBULATORY_CARE_PROVIDER_SITE_OTHER): Payer: BC Managed Care – PPO | Admitting: Psychology

## 2021-05-07 DIAGNOSIS — F3341 Major depressive disorder, recurrent, in partial remission: Secondary | ICD-10-CM

## 2021-05-07 NOTE — Progress Notes (Signed)
?PROGRESS NOTE: ? ?Name: Lynn Gonzales ?Date: 05/07/2021 ?MRN: [id number] ?DOB: [birth year] ?PCP: Pcp, No ? ?Start Time: 11:00 AM ?Stop Time: 11:55 AM ? ?Diagnosis ?F33.41 Major Depressive Disorder, recurrent, in remission ?F41.1  Generalized Anxiety Disorder ?Symptoms ?Decrease or loss of appetite. (Status: maintained) -- Skipping meals due to loss of appetite, or busy schedule  ?Depressed or irritable mood. (Status: maintained) -- Sad affect most of the time, easily losses patience  ?Diminished interest in or enjoyment of activities. (Status: maintained) -- Difficulties with motivation for normal everyday activities  ?Feelings of hopelessness, worthlessness, or inappropriate guilt. (Status: maintained) -- Familiy pressures making her feel guilty, disappointed in self  ?History of chronic or recurrent depression for which the client has taken antidepressant medication, been hospitalized, had outpatient treatment, or had a course of electroconvulsive therapy. (Status: maintained) -- Client and famiy describe long history of depression and previous suicide ideation and gestures  ?Lack of energy. (Status: maintained) -- Constant fatigue  ?Low self-esteem. (Status: maintained) -- No Description Entered  ?Poor concentration and indecisiveness. (Status: maintained) -- No Description Entered  ?Medication Status ?na  ?Safety ?none  ?If Suicidal or Homicidal State Action Taken: unspecified  ?Current Risk: low ?Medications ?unspecified ?Objectives ?Related Problem: Balance life activities between consideration of others and development of own interests. ?Description: Identify values that guide life's decisions and determine fulfillment. ?Target Date: 2021-06-19 ?Frequency: Weekly ?Modality: individual ?Progress: 20%  ?Related Problem: Balance life activities between consideration of others and development of own interests. ?Description: Implement new activities that increase a sense of satisfaction. ?Target Date:  2021-06-19 ?Frequency: Weekly ?Modality: individual ?Progress: 20%  ?Related Problem: Balance life activities between consideration of others and development of own interests. ?Description: Implement increased assertiveness to take control of conflicts. ?Target Date: 2021-06-19 ?Frequency: Weekly ?Modality: individual ?Progress: 10% ? ?Planned Intervention: Encourage the patient to use assertiveness skills and boundary setting application to the client's daily life.  ?Related Problem: Balance life activities between consideration of others and development of own interests. ?Description: Apply problem-solving skills to current circumstances. ?Target Date: 2021-06-19 ?Frequency: Weekly ?Modality: individual ?Progress: 10% ? ?Related Problem: Balance life activities between consideration of others and development of own interests. ?Description: Increase communication with significant others regarding current life stress factors. ?Target Date: 2021-06-19 ?Frequency: Weekly ?Modality: individual ?Progress: 10% ? ?Related Problem: Balance life activities between consideration of others and development of own interests. ?Description: Implement changes in time and effort allocation to restore balance to life. ?Target Date: 2021-06-19 ?Frequency: Weekly ?Modality: individual ?Progress: 0%  ?Related Problem: Balance life activities between consideration of others and development of own interests. ?Description: Increase social contacts to reduce sense of isolation. ?Target Date: 2021-06-19 ?Frequency: Weekly ?Modality: individual ?Progress: 0%  ?Related Problem: Balance life activities between consideration of others and development of own interests. ?Description: Share emotional struggles related to current adjustment stress. ?Target Date: 2021-06-19 ?Frequency: Weekly ?Modality: individual ?Progress: 60%  ?Related Problem: Balance life activities between consideration of others and development of own interests. ?Description:  Significant others offer support to reduce the client's stress. ?Target Date: 2021-06-19 ?Frequency: Weekly ?Modality: individual ?Progress: 10%  ?Related Problem: Balance life activities between consideration of others and development of own interests. ?Description: Describe the circumstances of life that are contributing to stress, anxiety, or lack of fulfillment. ?Target Date: 2021-06-19 ?Frequency: Weekly ?Modality: individual ?Progress: 50% ? ?Related Problem: Balance life activities between consideration of others and development of own interests. ?Description: Increase activities that reinforce a positive self-identity. ?Target Date: 2021-06-19 ?Frequency: Weekly ?  Modality: individual ?Progress: 30%  ?Related Problem: Develop healthy interpersonal relationships that lead to the alleviation and help prevent the relapse of depression. ?Description: Describe current and past experiences with depression including their impact on functioning and attempts to resolve it. ?Target Date: 2021-06-19 ?Frequency: Weekly ?Modality: individual ?Progress: 0% ? ?Client Response ?full compliance  ?Service Location ?Location, [address] [place], [place] [address]  ?Service Code ?cpt 83437D (95) ?Clarify interpersonal incident (IPT)  ?Identify/label emotions  ?Validate/empathize   ?Communication analysis (IPT)  ?Rationally challenge thoughts or beliefs/cognitive restructuring  ?Identify automatic thoughts  ?Lifestyle change (exercise, nutrition)  ?Self care activities ?Grief and Loss Work ?Comments  ? ? ?Today I met with [name] in remote video (WebEx) face to face individual psychotherapy as an accommodation during the pandemic.  ? ?Distance Site: Client's Home ?Originating Site: Dr Jannifer Franklin Remote Office ?Obtained consent to transmit remotely: Verbal ? ?Pronouns: She/Her  ? ?Monaye reports that she's made some changes in staff to help manage her work stress.  We d/e/p what the current issues are, how she is  responding and p/s an action plan.  Alanya states that she has a meeting with her mentor tomorrow and reviewed what she wants to say, what the issues are and how to receive positive feedback when things aren't going "perfectly."   ? ? ?Progress: Pt's mood remains stable at mild to moderate levels of depression, but she is managing to move forward and make independent decisions for her future.   ? ? ?[name], PhD ? ? ?

## 2021-05-14 ENCOUNTER — Ambulatory Visit (INDEPENDENT_AMBULATORY_CARE_PROVIDER_SITE_OTHER): Payer: BC Managed Care – PPO | Admitting: Psychology

## 2021-05-14 DIAGNOSIS — F3341 Major depressive disorder, recurrent, in partial remission: Secondary | ICD-10-CM

## 2021-05-14 NOTE — Progress Notes (Signed)
?PROGRESS NOTE: ? ? ?Name: Lynn Gonzales ?Date: 05/14/2021 ?MRN: [id number] ?DOB: 25-Oct-1995 ?PCP: Pcp, No ? ?Start Time: 11:00 AM ?Stop Time: 11:55 AM ? ?Note: Annual Review 06-19-2021 ? ?Diagnosis ?F33.41 Major Depressive Disorder, recurrent, in remission ?F41.1  Generalized Anxiety Disorder ?Symptoms ?Decrease or loss of appetite. (Status: maintained) -- Skipping meals due to loss of appetite, or busy schedule  ?Depressed or irritable mood. (Status: maintained) -- Sad affect most of the time, easily losses patience  ?Diminished interest in or enjoyment of activities. (Status: maintained) -- Difficulties with motivation for normal everyday activities  ?Feelings of hopelessness, worthlessness, or inappropriate guilt. (Status: maintained) -- Familiy pressures making her feel guilty, disappointed in self  ?History of chronic or recurrent depression for which the client has taken antidepressant medication, been hospitalized, had outpatient treatment, or had a course of electroconvulsive therapy. (Status: maintained) -- Client and famiy describe long history of depression and previous suicide ideation and gestures  ?Lack of energy. (Status: maintained) -- Constant fatigue  ?Low self-esteem. (Status: maintained) -- No Description Entered  ?Poor concentration and indecisiveness. (Status: maintained) -- No Description Entered  ?Medication Status ?na  ?Safety ?none  ?If Suicidal or Homicidal State Action Taken: unspecified  ?Current Risk: low ?Medications ?unspecified ?Objectives ?Related Problem: Balance life activities between consideration of others and development of own interests. ?Description: Identify values that guide life's decisions and determine fulfillment. ?Target Date: 2021-06-19 ?Frequency: Weekly ?Modality: individual ?Progress: 20%  ?Related Problem: Balance life activities between consideration of others and development of own interests. ?Description: Implement new activities that increase a sense of  satisfaction. ?Target Date: 2021-06-19 ?Frequency: Weekly ?Modality: individual ?Progress: 20%  ?Related Problem: Balance life activities between consideration of others and development of own interests. ?Description: Implement increased assertiveness to take control of conflicts. ?Target Date: 2021-06-19 ?Frequency: Weekly ?Modality: individual ?Progress: 10% ? ?Planned Intervention: Encourage the patient to use assertiveness skills and boundary setting application to the client's daily life.  ?Related Problem: Balance life activities between consideration of others and development of own interests. ?Description: Apply problem-solving skills to current circumstances. ?Target Date: 2021-06-19 ?Frequency: Weekly ?Modality: individual ?Progress: 10% ? ?Related Problem: Balance life activities between consideration of others and development of own interests. ?Description: Increase communication with significant others regarding current life stress factors. ?Target Date: 2021-06-19 ?Frequency: Weekly ?Modality: individual ?Progress: 10% ? ?Related Problem: Balance life activities between consideration of others and development of own interests. ?Description: Implement changes in time and effort allocation to restore balance to life. ?Target Date: 2021-06-19 ?Frequency: Weekly ?Modality: individual ?Progress: 0%  ?Related Problem: Balance life activities between consideration of others and development of own interests. ?Description: Increase social contacts to reduce sense of isolation. ?Target Date: 2021-06-19 ?Frequency: Weekly ?Modality: individual ?Progress: 0%  ?Related Problem: Balance life activities between consideration of others and development of own interests. ?Description: Share emotional struggles related to current adjustment stress. ?Target Date: 2021-06-19 ?Frequency: Weekly ?Modality: individual ?Progress: 60%  ?Related Problem: Balance life activities between consideration of others and development of  own interests. ?Description: Significant others offer support to reduce the client's stress. ?Target Date: 2021-06-19 ?Frequency: Weekly ?Modality: individual ?Progress: 10%  ?Related Problem: Balance life activities between consideration of others and development of own interests. ?Description: Describe the circumstances of life that are contributing to stress, anxiety, or lack of fulfillment. ?Target Date: 2021-06-19 ?Frequency: Weekly ?Modality: individual ?Progress: 50% ? ?Related Problem: Balance life activities between consideration of others and development of own interests. ?Description: Increase activities that reinforce a positive  self-identity. ?Target Date: 2021-06-19 ?Frequency: Weekly ?Modality: individual ?Progress: 30%  ?Related Problem: Develop healthy interpersonal relationships that lead to the alleviation and help prevent the relapse of depression. ?Description: Describe current and past experiences with depression including their impact on functioning and attempts to resolve it. ?Target Date: 2021-06-19 ?Frequency: Weekly ?Modality: individual ?Progress: 0% ? ?Client Response ?full compliance  ?Service Location ?Location, [address] [place], [place] [address]  ?Service Code ?cpt 57322V (95) ?Clarify interpersonal incident (IPT)  ?Identify/label emotions  ?Validate/empathize   ?Communication analysis (IPT)  ?Rationally challenge thoughts or beliefs/cognitive restructuring  ?Identify automatic thoughts  ?Lifestyle change (exercise, nutrition)  ?Self care activities ?Grief and Loss Work ?Comments  ? ? ?Today I met with Lynn Gonzales in remote video (WebEx) face to face individual psychotherapy as an accommodation during the pandemic.  ? ?Distance Site: Client's Home ?Originating Site: Dr Jannifer Franklin Remote Office ?Obtained consent to transmit remotely: Verbal ? ?Pronouns: She/Her  ? ?Lynn Gonzales reports that she got a call from a work associate at 8 pm last night for an issues that have come up  repeatedly.  We d/e/p what occurred, and how she understood why these events occurred.  Together we p/s and I supported her attempts to cope in a positive manner. ? ? ? ?Progress: Pt's mood remains stable at mild to moderate levels of depression, but she is managing to move forward and make independent decisions for her future.   ? ? ?Lynn Crochet, PhD ? ?

## 2021-05-18 ENCOUNTER — Ambulatory Visit: Payer: PRIVATE HEALTH INSURANCE | Admitting: Psychology

## 2021-05-21 ENCOUNTER — Ambulatory Visit (INDEPENDENT_AMBULATORY_CARE_PROVIDER_SITE_OTHER): Payer: BC Managed Care – PPO | Admitting: Psychology

## 2021-05-21 DIAGNOSIS — F330 Major depressive disorder, recurrent, mild: Secondary | ICD-10-CM

## 2021-05-21 NOTE — Progress Notes (Signed)
?PROGRESS NOTE: ? ? ?Name: Lynn Gonzales ?Date: 05/21/2021 ?MRN: [id number] ?DOB: 13-Sep-1995 ?PCP: Pcp, No ? ?Start Time: 11:00 AM ?Stop Time: 11:58 AM ? ?Note: Annual Review 06-19-2021 ? ?Diagnosis ?F33.41 Major Depressive Disorder, recurrent, in remission ?F41.1  Generalized Anxiety Disorder ?Symptoms ?Decrease or loss of appetite. (Status: maintained) -- Skipping meals due to loss of appetite, or busy schedule  ?Depressed or irritable mood. (Status: maintained) -- Sad affect most of the time, easily losses patience  ?Diminished interest in or enjoyment of activities. (Status: maintained) -- Difficulties with motivation for normal everyday activities  ?Feelings of hopelessness, worthlessness, or inappropriate guilt. (Status: maintained) -- Familiy pressures making her feel guilty, disappointed in self  ?History of chronic or recurrent depression for which the client has taken antidepressant medication, been hospitalized, had outpatient treatment, or had a course of electroconvulsive therapy. (Status: maintained) -- Client and famiy describe long history of depression and previous suicide ideation and gestures  ?Lack of energy. (Status: maintained) -- Constant fatigue  ?Low self-esteem. (Status: maintained) -- No Description Entered  ?Poor concentration and indecisiveness. (Status: maintained) -- No Description Entered  ?Medication Status ?na  ?Safety ?none  ?If Suicidal or Homicidal State Action Taken: unspecified  ?Current Risk: low ?Medications ?unspecified ?Objectives ?Related Problem: Balance life activities between consideration of others and development of own interests. ?Description: Identify values that guide life's decisions and determine fulfillment. ?Target Date: 2021-06-19 ?Frequency: Weekly ?Modality: individual ?Progress: 20%  ?Related Problem: Balance life activities between consideration of others and development of own interests. ?Description: Implement new activities that increase a sense of  satisfaction. ?Target Date: 2021-06-19 ?Frequency: Weekly ?Modality: individual ?Progress: 20%  ?Related Problem: Balance life activities between consideration of others and development of own interests. ?Description: Implement increased assertiveness to take control of conflicts. ?Target Date: 2021-06-19 ?Frequency: Weekly ?Modality: individual ?Progress: 10% ? ?Planned Intervention: Encourage the patient to use assertiveness skills and boundary setting application to the client's daily life.  ?Related Problem: Balance life activities between consideration of others and development of own interests. ?Description: Apply problem-solving skills to current circumstances. ?Target Date: 2021-06-19 ?Frequency: Weekly ?Modality: individual ?Progress: 10% ? ?Related Problem: Balance life activities between consideration of others and development of own interests. ?Description: Increase communication with significant others regarding current life stress factors. ?Target Date: 2021-06-19 ?Frequency: Weekly ?Modality: individual ?Progress: 10% ? ?Related Problem: Balance life activities between consideration of others and development of own interests. ?Description: Implement changes in time and effort allocation to restore balance to life. ?Target Date: 2021-06-19 ?Frequency: Weekly ?Modality: individual ?Progress: 0%  ?Related Problem: Balance life activities between consideration of others and development of own interests. ?Description: Increase social contacts to reduce sense of isolation. ?Target Date: 2021-06-19 ?Frequency: Weekly ?Modality: individual ?Progress: 0%  ?Related Problem: Balance life activities between consideration of others and development of own interests. ?Description: Share emotional struggles related to current adjustment stress. ?Target Date: 2021-06-19 ?Frequency: Weekly ?Modality: individual ?Progress: 60%  ?Related Problem: Balance life activities between consideration of others and development of  own interests. ?Description: Significant others offer support to reduce the client's stress. ?Target Date: 2021-06-19 ?Frequency: Weekly ?Modality: individual ?Progress: 10%  ?Related Problem: Balance life activities between consideration of others and development of own interests. ?Description: Describe the circumstances of life that are contributing to stress, anxiety, or lack of fulfillment. ?Target Date: 2021-06-19 ?Frequency: Weekly ?Modality: individual ?Progress: 50% ? ?Related Problem: Balance life activities between consideration of others and development of own interests. ?Description: Increase activities that reinforce a positive  self-identity. ?Target Date: 2021-06-19 ?Frequency: Weekly ?Modality: individual ?Progress: 30%  ?Related Problem: Develop healthy interpersonal relationships that lead to the alleviation and help prevent the relapse of depression. ?Description: Describe current and past experiences with depression including their impact on functioning and attempts to resolve it. ?Target Date: 2021-06-19 ?Frequency: Weekly ?Modality: individual ?Progress: 0% ? ?Client Response ?full compliance  ?Service Location ?Location, [address] [place], [place] [address]  ?Service Code ?cpt 92426S (95) ?Clarify interpersonal incident (IPT)  ?Identify/label emotions  ?Validate/empathize   ?Communication analysis (IPT)  ?Rationally challenge thoughts or beliefs/cognitive restructuring  ?Identify automatic thoughts  ?Lifestyle change (exercise, nutrition)  ?Self care activities ?Grief and Loss Work ?Comments  ? ? ?Today I met with [name] in remote video (WebEx) face to face individual psychotherapy as an accommodation during the pandemic.  ? ?Distance Site: Client's Home ?Originating Site: Dr Jannifer Franklin Remote Office ?Obtained consent to transmit remotely: Verbal ? ?Pronouns: She/Her  ? ?[name] reports that she was sick this past weekend and she was still feeling fatigued.  The main issues she  wanted to d/ were related to [place] and his family.  We d/e/p what occurred and what she thought/felt.  We then d/ actionable steps. ? ? ?Progress: Pt's mood remains stable at mild to moderate levels of depression, but she is managing to move forward and make independent decisions for her future.   ? ? ?[name], PhD ? ? ? ? ? ? ? ? ? ? ? ? ? ? ? ?[name], PhD ?

## 2021-05-25 ENCOUNTER — Ambulatory Visit (INDEPENDENT_AMBULATORY_CARE_PROVIDER_SITE_OTHER): Payer: BC Managed Care – PPO | Admitting: Psychology

## 2021-05-25 DIAGNOSIS — F330 Major depressive disorder, recurrent, mild: Secondary | ICD-10-CM

## 2021-05-25 NOTE — Progress Notes (Signed)
?PROGRESS NOTE: ? ? ?Name: Lynn Gonzales ?Date: 05/25/2021 ?MRN: [id number] ?DOB: 07/09/1995 ?PCP: Pcp, No ? ?Start Time: 10:00 AM ?Stop Time: 10:56 AM ? ?Note: Annual Review 06-19-2021 ? ?Diagnosis ?F33.41 Major Depressive Disorder, recurrent, in remission ?F41.1  Generalized Anxiety Disorder ?Symptoms ?Decrease or loss of appetite. (Status: maintained) -- Skipping meals due to loss of appetite, or busy schedule  ?Depressed or irritable mood. (Status: maintained) -- Sad affect most of the time, easily losses patience  ?Diminished interest in or enjoyment of activities. (Status: maintained) -- Difficulties with motivation for normal everyday activities  ?Feelings of hopelessness, worthlessness, or inappropriate guilt. (Status: maintained) -- Familiy pressures making her feel guilty, disappointed in self  ?History of chronic or recurrent depression for which the client has taken antidepressant medication, been hospitalized, had outpatient treatment, or had a course of electroconvulsive therapy. (Status: maintained) -- Client and famiy describe long history of depression and previous suicide ideation and gestures  ?Lack of energy. (Status: maintained) -- Constant fatigue  ?Low self-esteem. (Status: maintained) -- No Description Entered  ?Poor concentration and indecisiveness. (Status: maintained) -- No Description Entered  ?Medication Status ?na  ?Safety ?none  ?If Suicidal or Homicidal State Action Taken: unspecified  ?Current Risk: low ?Medications ?unspecified ?Objectives ?Related Problem: Balance life activities between consideration of others and development of own interests. ?Description: Identify values that guide life's decisions and determine fulfillment. ?Target Date: 2021-06-19 ?Frequency: Weekly ?Modality: individual ?Progress: 20%  ?Related Problem: Balance life activities between consideration of others and development of own interests. ?Description: Implement new activities that increase a sense of  satisfaction. ?Target Date: 2021-06-19 ?Frequency: Weekly ?Modality: individual ?Progress: 20%  ?Related Problem: Balance life activities between consideration of others and development of own interests. ?Description: Implement increased assertiveness to take control of conflicts. ?Target Date: 2021-06-19 ?Frequency: Weekly ?Modality: individual ?Progress: 10% ? ?Planned Intervention: Encourage the patient to use assertiveness skills and boundary setting application to the client's daily life.  ?Related Problem: Balance life activities between consideration of others and development of own interests. ?Description: Apply problem-solving skills to current circumstances. ?Target Date: 2021-06-19 ?Frequency: Weekly ?Modality: individual ?Progress: 10% ? ?Related Problem: Balance life activities between consideration of others and development of own interests. ?Description: Increase communication with significant others regarding current life stress factors. ?Target Date: 2021-06-19 ?Frequency: Weekly ?Modality: individual ?Progress: 10% ? ?Related Problem: Balance life activities between consideration of others and development of own interests. ?Description: Implement changes in time and effort allocation to restore balance to life. ?Target Date: 2021-06-19 ?Frequency: Weekly ?Modality: individual ?Progress: 0%  ?Related Problem: Balance life activities between consideration of others and development of own interests. ?Description: Increase social contacts to reduce sense of isolation. ?Target Date: 2021-06-19 ?Frequency: Weekly ?Modality: individual ?Progress: 0%  ?Related Problem: Balance life activities between consideration of others and development of own interests. ?Description: Share emotional struggles related to current adjustment stress. ?Target Date: 2021-06-19 ?Frequency: Weekly ?Modality: individual ?Progress: 60%  ?Related Problem: Balance life activities between consideration of others and development of  own interests. ?Description: Significant others offer support to reduce the client's stress. ?Target Date: 2021-06-19 ?Frequency: Weekly ?Modality: individual ?Progress: 10%  ?Related Problem: Balance life activities between consideration of others and development of own interests. ?Description: Describe the circumstances of life that are contributing to stress, anxiety, or lack of fulfillment. ?Target Date: 2021-06-19 ?Frequency: Weekly ?Modality: individual ?Progress: 50% ? ?Related Problem: Balance life activities between consideration of others and development of own interests. ?Description: Increase activities that reinforce a positive  self-identity. ?Target Date: 2021-06-19 ?Frequency: Weekly ?Modality: individual ?Progress: 30%  ?Related Problem: Develop healthy interpersonal relationships that lead to the alleviation and help prevent the relapse of depression. ?Description: Describe current and past experiences with depression including their impact on functioning and attempts to resolve it. ?Target Date: 2021-06-19 ?Frequency: Weekly ?Modality: individual ?Progress: 0% ? ?Client Response ?full compliance  ?Service Location ?Location, [address] [place],  [address]  ?Service Code ?cpt 16837G (95) ?Clarify interpersonal incident (IPT)  ?Identify/label emotions  ?Validate/empathize   ?Communication analysis (IPT)  ?Rationally challenge thoughts or beliefs/cognitive restructuring  ?Identify automatic thoughts  ?Lifestyle change (exercise, nutrition)  ?Self care activities ?Grief and Loss Work ?Comments  ? ? ?Today I met with Lynn Gonzales in remote video (WebEx) face to face individual psychotherapy as an accommodation during the pandemic.  ? ?Distance Site: Client's Home ?Originating Site: Dr Jannifer Franklin Remote Office ?Obtained consent to transmit remotely: Verbal ? ?Pronouns: She/Her  ? ?Lynn Gonzales reports that it was an exhausting weekend.  She still wasn't feeling really well and Lynn Gonzales was in a dark  mood.  We d/e/p her confusion, what she has tried to manage the situation and alternative actions.  I provided her with some resources for understanding Lynn Gonzales, his family dynamics and why he struggles with balance and gets easily overwhelmed.   ? ? ?Progress: Pt's mood remains stable at mild to moderate levels of depression, but she is managing to move forward and make independent decisions for her future.   ? ? ?Lynn Crochet, PhD ? ? ? ? ? ? ? ? ? ? ? ? ? ? ? ? ?Lynn Crochet, PhD ?

## 2021-05-28 ENCOUNTER — Ambulatory Visit (INDEPENDENT_AMBULATORY_CARE_PROVIDER_SITE_OTHER): Payer: BC Managed Care – PPO | Admitting: Psychology

## 2021-05-28 DIAGNOSIS — F330 Major depressive disorder, recurrent, mild: Secondary | ICD-10-CM

## 2021-05-28 NOTE — Progress Notes (Signed)
?PROGRESS NOTE: ? ? ?Name: Lynn Gonzales ?Date: 05/28/2021 ?MRN: [id number] ?DOB: [birth year] ?PCP: Pcp, No ? ?Start Time: 11:00 AM ?Stop Time: 11:56 AM ? ?Note: Annual Review 06-19-2021 ? ?Diagnosis ?F33.41 Major Depressive Disorder, recurrent, in remission ?F41.1  Generalized Anxiety Disorder ?Symptoms ?Decrease or loss of appetite. (Status: maintained) -- Skipping meals due to loss of appetite, or busy schedule  ?Depressed or irritable mood. (Status: maintained) -- Sad affect most of the time, easily losses patience  ?Diminished interest in or enjoyment of activities. (Status: maintained) -- Difficulties with motivation for normal everyday activities  ?Feelings of hopelessness, worthlessness, or inappropriate guilt. (Status: maintained) -- Familiy pressures making her feel guilty, disappointed in self  ?History of chronic or recurrent depression for which the client has taken antidepressant medication, been hospitalized, had outpatient treatment, or had a course of electroconvulsive therapy. (Status: maintained) -- Client and famiy describe long history of depression and previous suicide ideation and gestures  ?Lack of energy. (Status: maintained) -- Constant fatigue  ?Low self-esteem. (Status: maintained) -- No Description Entered  ?Poor concentration and indecisiveness. (Status: maintained) -- No Description Entered  ?Medication Status ?na  ?Safety ?none  ?If Suicidal or Homicidal State Action Taken: unspecified  ?Current Risk: low ?Medications ?unspecified ?Objectives ?Related Problem: Balance life activities between consideration of others and development of own interests. ?Description: Identify values that guide life's decisions and determine fulfillment. ?Target Date: 2021-06-19 ?Frequency: Weekly ?Modality: individual ?Progress: 20%  ?Related Problem: Balance life activities between consideration of others and development of own interests. ?Description: Implement new activities that increase a sense of  satisfaction. ?Target Date: 2021-06-19 ?Frequency: Weekly ?Modality: individual ?Progress: 20%  ?Related Problem: Balance life activities between consideration of others and development of own interests. ?Description: Implement increased assertiveness to take control of conflicts. ?Target Date: 2021-06-19 ?Frequency: Weekly ?Modality: individual ?Progress: 10% ? ?Planned Intervention: Encourage the patient to use assertiveness skills and boundary setting application to the client's daily life.  ?Related Problem: Balance life activities between consideration of others and development of own interests. ?Description: Apply problem-solving skills to current circumstances. ?Target Date: 2021-06-19 ?Frequency: Weekly ?Modality: individual ?Progress: 10% ? ?Related Problem: Balance life activities between consideration of others and development of own interests. ?Description: Increase communication with significant others regarding current life stress factors. ?Target Date: 2021-06-19 ?Frequency: Weekly ?Modality: individual ?Progress: 10% ? ?Related Problem: Balance life activities between consideration of others and development of own interests. ?Description: Implement changes in time and effort allocation to restore balance to life. ?Target Date: 2021-06-19 ?Frequency: Weekly ?Modality: individual ?Progress: 0%  ?Related Problem: Balance life activities between consideration of others and development of own interests. ?Description: Increase social contacts to reduce sense of isolation. ?Target Date: 2021-06-19 ?Frequency: Weekly ?Modality: individual ?Progress: 0%  ?Related Problem: Balance life activities between consideration of others and development of own interests. ?Description: Share emotional struggles related to current adjustment stress. ?Target Date: 2021-06-19 ?Frequency: Weekly ?Modality: individual ?Progress: 60%  ?Related Problem: Balance life activities between consideration of others and development of  own interests. ?Description: Significant others offer support to reduce the client's stress. ?Target Date: 2021-06-19 ?Frequency: Weekly ?Modality: individual ?Progress: 10%  ?Related Problem: Balance life activities between consideration of others and development of own interests. ?Description: Describe the circumstances of life that are contributing to stress, anxiety, or lack of fulfillment. ?Target Date: 2021-06-19 ?Frequency: Weekly ?Modality: individual ?Progress: 50% ? ?Related Problem: Balance life activities between consideration of others and development of own interests. ?Description: Increase activities that reinforce a positive  self-identity. ?Target Date: 2021-06-19 ?Frequency: Weekly ?Modality: individual ?Progress: 30%  ?Related Problem: Develop healthy interpersonal relationships that lead to the alleviation and help prevent the relapse of depression. ?Description: Describe current and past experiences with depression including their impact on functioning and attempts to resolve it. ?Target Date: 2021-06-19 ?Frequency: Weekly ?Modality: individual ?Progress: 0% ? ?Client Response ?full compliance  ?Service Location ?Location, [address] [place], [place] [address]  ?Service Code ?cpt 67544B (95) ?Clarify interpersonal incident (IPT)  ?Identify/label emotions  ?Validate/empathize   ?Communication analysis (IPT)  ?Rationally challenge thoughts or beliefs/cognitive restructuring  ?Identify automatic thoughts  ?Lifestyle change (exercise, nutrition)  ?Self care activities ?Grief and Loss Work ?Comments  ? ? ?Today I met with Lynn Gonzales in remote video (WebEx) face to face individual psychotherapy as an accommodation during the pandemic.  ? ?Distance Site: Client's Home ?Originating Site: Dr Jannifer Franklin Remote Office ?Obtained consent to transmit remotely: Verbal ? ?Pronouns: She/Her  ? ?Lynn Gonzales reports that it was a tough time this past weekend with her husband.  We d/e/p the various issues and  situations that arose over the weekend.  We d/e ways to let the small things go as a way to better manage her stress.  We also d/ ways to communicate support of exploration and competence.  Often she finds herself in a bind with her husband when he "doesn't know what to do" and I encouraged her to communicate this to him. ? ?Lynn Gonzales had an appointment with [facility] admissions.  She sought out help from her father to clean up her resume and personal statement.   He then shared her information with a co-worker.  She did a good job d/ boundaries with her father and asking that he be more cognizant of protecting her profession reputation.   ? ? ? ?Progress: Pt's mood remains stable at mild to moderate levels of depression, but she is managing to move forward and make independent decisions for her future.   ? ? ?Lynn Crochet, PhD ? ? ?

## 2021-06-01 ENCOUNTER — Ambulatory Visit (INDEPENDENT_AMBULATORY_CARE_PROVIDER_SITE_OTHER): Payer: BC Managed Care – PPO | Admitting: Psychology

## 2021-06-01 DIAGNOSIS — F330 Major depressive disorder, recurrent, mild: Secondary | ICD-10-CM | POA: Diagnosis not present

## 2021-06-01 NOTE — Progress Notes (Signed)
? ? ? ? ? ? ? ? ? ?PROGRESS NOTE: ? ? ?Name: Lynn Gonzales ?Date: 06/01/2021 ?MRN: [id number] ?DOB: Feb 20, 1995 ?PCP: Pcp, No ? ?Start Time: 10:00 AM ?Stop Time: 10:56 AM ? ?Note: Annual Review 06-19-2021 ? ?Diagnosis ?F33.41 Major Depressive Disorder, recurrent, in remission ?F41.1  Generalized Anxiety Disorder ?Symptoms ?Decrease or loss of appetite. (Status: maintained) -- Skipping meals due to loss of appetite, or busy schedule  ?Depressed or irritable mood. (Status: maintained) -- Sad affect most of the time, easily losses patience  ?Diminished interest in or enjoyment of activities. (Status: maintained) -- Difficulties with motivation for normal everyday activities  ?Feelings of hopelessness, worthlessness, or inappropriate guilt. (Status: maintained) -- Familiy pressures making her feel guilty, disappointed in self  ?History of chronic or recurrent depression for which the client has taken antidepressant medication, been hospitalized, had outpatient treatment, or had a course of electroconvulsive therapy. (Status: maintained) -- Client and famiy describe long history of depression and previous suicide ideation and gestures  ?Lack of energy. (Status: maintained) -- Constant fatigue  ?Low self-esteem. (Status: maintained) -- No Description Entered  ?Poor concentration and indecisiveness. (Status: maintained) -- No Description Entered  ?Medication Status ?na  ?Safety ?none  ?If Suicidal or Homicidal State Action Taken: unspecified  ?Current Risk: low ?Medications ?unspecified ?Objectives ?Related Problem: Balance life activities between consideration of others and development of own interests. ?Description: Identify values that guide life's decisions and determine fulfillment. ?Target Date: 2021-06-19 ?Frequency: Weekly ?Modality: individual ?Progress: 20%  ?Related Problem: Balance life activities between consideration of others and development of own interests. ?Description: Implement new activities that  increase a sense of satisfaction. ?Target Date: 2021-06-19 ?Frequency: Weekly ?Modality: individual ?Progress: 20%  ?Related Problem: Balance life activities between consideration of others and development of own interests. ?Description: Implement increased assertiveness to take control of conflicts. ?Target Date: 2021-06-19 ?Frequency: Weekly ?Modality: individual ?Progress: 10% ? ?Planned Intervention: Encourage the patient to use assertiveness skills and boundary setting application to the client's daily life.  ?Related Problem: Balance life activities between consideration of others and development of own interests. ?Description: Apply problem-solving skills to current circumstances. ?Target Date: 2021-06-19 ?Frequency: Weekly ?Modality: individual ?Progress: 10% ? ?Related Problem: Balance life activities between consideration of others and development of own interests. ?Description: Increase communication with significant others regarding current life stress factors. ?Target Date: 2021-06-19 ?Frequency: Weekly ?Modality: individual ?Progress: 10% ? ?Related Problem: Balance life activities between consideration of others and development of own interests. ?Description: Implement changes in time and effort allocation to restore balance to life. ?Target Date: 2021-06-19 ?Frequency: Weekly ?Modality: individual ?Progress: 0%  ?Related Problem: Balance life activities between consideration of others and development of own interests. ?Description: Increase social contacts to reduce sense of isolation. ?Target Date: 2021-06-19 ?Frequency: Weekly ?Modality: individual ?Progress: 0%  ?Related Problem: Balance life activities between consideration of others and development of own interests. ?Description: Share emotional struggles related to current adjustment stress. ?Target Date: 2021-06-19 ?Frequency: Weekly ?Modality: individual ?Progress: 60%  ?Related Problem: Balance life activities between consideration of others  and development of own interests. ?Description: Significant others offer support to reduce the client's stress. ?Target Date: 2021-06-19 ?Frequency: Weekly ?Modality: individual ?Progress: 10%  ?Related Problem: Balance life activities between consideration of others and development of own interests. ?Description: Describe the circumstances of life that are contributing to stress, anxiety, or lack of fulfillment. ?Target Date: 2021-06-19 ?Frequency: Weekly ?Modality: individual ?Progress: 50% ? ?Related Problem: Balance life activities between consideration of others and development of  own interests. ?Description: Increase activities that reinforce a positive self-identity. ?Target Date: 2021-06-19 ?Frequency: Weekly ?Modality: individual ?Progress: 30%  ?Related Problem: Develop healthy interpersonal relationships that lead to the alleviation and help prevent the relapse of depression. ?Description: Describe current and past experiences with depression including their impact on functioning and attempts to resolve it. ?Target Date: 2021-06-19 ?Frequency: Weekly ?Modality: individual ?Progress: 0% ? ?Client Response ?full compliance  ?Service Location ?Location, [address] [place], [place] [address]  ?Service Code ?cpt 05397Q (95) ?Clarify interpersonal incident (IPT)  ?Identify/label emotions  ?Validate/empathize   ?Communication analysis (IPT)  ?Rationally challenge thoughts or beliefs/cognitive restructuring  ?Identify automatic thoughts  ?Lifestyle change (exercise, nutrition)  ?Self care activities ?Grief and Loss Work ?Comments  ? ? ?Today I met with Lynn Gonzales in remote video (WebEx) face to face individual psychotherapy as an accommodation during the pandemic.  ? ?Distance Site: Client's Home ?Originating Site: Dr Jannifer Franklin Remote Office ?Obtained consent to transmit remotely: Verbal ? ?Pronouns: She/Her  ? ?Lynn Gonzales reports that she took time this weekend to focus on organizing her home work space.   We d/ that now that she has made a committment to staying at this job, she needs to create a space that is conducive to work and [employer]. I noted that having an organized and attractive work space I also important in maintaining positive mood states. ? ?Lynn Gonzales also talked about some struggles she is having with her sisters.  We d/e/p these troubles, her feeling of frustration and not knowing what to do differently in her interactions with her sisters.  I offered  some suggestions, identified where she might find the support she needs outside of her sisters and offered some suggestions for how stay engaged while protecting her mental health.   ? ? ? ? ?Progress: Pt's mood remains stable at mild to moderate levels of depression, but she is managing to move forward and make independent decisions for her future.   ? ? ?Lynn Crochet, PhD ? ? ?

## 2021-06-04 ENCOUNTER — Ambulatory Visit: Payer: PRIVATE HEALTH INSURANCE | Admitting: Psychology

## 2021-06-08 ENCOUNTER — Ambulatory Visit (INDEPENDENT_AMBULATORY_CARE_PROVIDER_SITE_OTHER): Payer: BC Managed Care – PPO | Admitting: Psychology

## 2021-06-08 ENCOUNTER — Ambulatory Visit: Payer: BC Managed Care – PPO | Admitting: Psychology

## 2021-06-08 DIAGNOSIS — F3341 Major depressive disorder, recurrent, in partial remission: Secondary | ICD-10-CM | POA: Diagnosis not present

## 2021-06-08 NOTE — Progress Notes (Signed)
?PROGRESS NOTE: ? ? ?Name: Lynn Gonzales ?Date: 06/08/2021 ?MRN: [id number] ?DOB: 03-Jun-1995 ?PCP: Pcp, No ? ?Start Time: 10:00 AM ?Stop Time: 10:56 AM ? ?Note: Annual Review 06-19-2021 ? ?Diagnosis ?F33.41 Major Depressive Disorder, recurrent, in remission ?F41.1  Generalized Anxiety Disorder ?Symptoms ?Decrease or loss of appetite. (Status: maintained) -- Skipping meals due to loss of appetite, or busy schedule  ?Depressed or irritable mood. (Status: maintained) -- Sad affect most of the time, easily losses patience  ?Diminished interest in or enjoyment of activities. (Status: maintained) -- Difficulties with motivation for normal everyday activities  ?Feelings of hopelessness, worthlessness, or inappropriate guilt. (Status: maintained) -- Familiy pressures making her feel guilty, disappointed in self  ?History of chronic or recurrent depression for which the client has taken antidepressant medication, been hospitalized, had outpatient treatment, or had a course of electroconvulsive therapy. (Status: maintained) -- Client and famiy describe long history of depression and previous suicide ideation and gestures  ?Lack of energy. (Status: maintained) -- Constant fatigue  ?Low self-esteem. (Status: maintained) -- No Description Entered  ?Poor concentration and indecisiveness. (Status: maintained) -- No Description Entered  ?Medication Status ?na  ?Safety ?none  ?If Suicidal or Homicidal State Action Taken: unspecified  ?Current Risk: low ?Medications ?unspecified ?Objectives ?Related Problem: Balance life activities between consideration of others and development of own interests. ?Description: Identify values that guide life's decisions and determine fulfillment. ?Target Date: 2021-06-19 ?Frequency: Weekly ?Modality: individual ?Progress: 20%  ?Related Problem: Balance life activities between consideration of others and development of own interests. ?Description: Implement new activities that increase a sense of  satisfaction. ?Target Date: 2021-06-19 ?Frequency: Weekly ?Modality: individual ?Progress: 20%  ?Related Problem: Balance life activities between consideration of others and development of own interests. ?Description: Implement increased assertiveness to take control of conflicts. ?Target Date: 2021-06-19 ?Frequency: Weekly ?Modality: individual ?Progress: 10% ? ?Planned Intervention: Encourage the patient to use assertiveness skills and boundary setting application to the client's daily life.  ?Related Problem: Balance life activities between consideration of others and development of own interests. ?Description: Apply problem-solving skills to current circumstances. ?Target Date: 2021-06-19 ?Frequency: Weekly ?Modality: individual ?Progress: 10% ? ?Related Problem: Balance life activities between consideration of others and development of own interests. ?Description: Increase communication with significant others regarding current life stress factors. ?Target Date: 2021-06-19 ?Frequency: Weekly ?Modality: individual ?Progress: 10% ? ?Related Problem: Balance life activities between consideration of others and development of own interests. ?Description: Implement changes in time and effort allocation to restore balance to life. ?Target Date: 2021-06-19 ?Frequency: Weekly ?Modality: individual ?Progress: 0%  ?Related Problem: Balance life activities between consideration of others and development of own interests. ?Description: Increase social contacts to reduce sense of isolation. ?Target Date: 2021-06-19 ?Frequency: Weekly ?Modality: individual ?Progress: 0%  ?Related Problem: Balance life activities between consideration of others and development of own interests. ?Description: Share emotional struggles related to current adjustment stress. ?Target Date: 2021-06-19 ?Frequency: Weekly ?Modality: individual ?Progress: 60%  ?Related Problem: Balance life activities between consideration of others and development of  own interests. ?Description: Significant others offer support to reduce the client's stress. ?Target Date: 2021-06-19 ?Frequency: Weekly ?Modality: individual ?Progress: 10%  ?Related Problem: Balance life activities between consideration of others and development of own interests. ?Description: Describe the circumstances of life that are contributing to stress, anxiety, or lack of fulfillment. ?Target Date: 2021-06-19 ?Frequency: Weekly ?Modality: individual ?Progress: 50% ? ?Related Problem: Balance life activities between consideration of others and development of own interests. ?Description: Increase activities that reinforce a positive  self-identity. ?Target Date: 2021-06-19 ?Frequency: Weekly ?Modality: individual ?Progress: 30%  ?Related Problem: Develop healthy interpersonal relationships that lead to the alleviation and help prevent the relapse of depression. ?Description: Describe current and past experiences with depression including their impact on functioning and attempts to resolve it. ?Target Date: 2021-06-19 ?Frequency: Weekly ?Modality: individual ?Progress: 0% ? ?Client Response ?full compliance  ?Service Location ?Location, [address] [place], [place] [address]  ?Service Code ?cpt 61612O (95) ?Clarify interpersonal incident (IPT)  ?Identify/label emotions  ?Validate/empathize   ?Communication analysis (IPT)  ?Rationally challenge thoughts or beliefs/cognitive restructuring  ?Identify automatic thoughts  ?Lifestyle change (exercise, nutrition)  ?Self care activities ?Grief and Loss Work ?Comments  ? ? ?Today I met with [name] in remote video (WebEx) face to face individual psychotherapy as an accommodation during the pandemic.  ? ?Distance Site: Client's Home ?Originating Site: Dr Jannifer Franklin Remote Office ?Obtained consent to transmit remotely: Verbal ? ?Pronouns: She/Her  ? ?Vedika reports that she had a tough week of work.  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  She was excited to tell me that she was accepted into two [facility] programs.  We d/e the benefits of both programs and contrasted the downsides. ? ?Jalisa had a parenting question.  She states that her two year old son is very difficult sooth and it is stressful.  We d/e the different triggers and possible  solutions based on these triggers.  I emphasized that prevention  was easiest and we d/ what this might look like in different situations. ? ? ? ?Progress: Pt's mood remains stable at mild to moderate levels of depression, but she is managing to move forward and make independent decisions for her future.   ? ? ?[name], PhD ? ? ?

## 2021-06-11 ENCOUNTER — Ambulatory Visit (INDEPENDENT_AMBULATORY_CARE_PROVIDER_SITE_OTHER): Payer: BC Managed Care – PPO | Admitting: Psychology

## 2021-06-11 DIAGNOSIS — F3341 Major depressive disorder, recurrent, in partial remission: Secondary | ICD-10-CM

## 2021-06-11 NOTE — Progress Notes (Signed)
?PROGRESS NOTE: ? ? ?Name: Lynn Gonzales ?Date: 06/11/2021 ?MRN: [id number] ?DOB: 1996-02-07 ?PCP: Pcp, No ? ?Start Time: 11:00 AM ?Stop Time: 11:56 AM ? ?Note: Annual Review 06-19-2021 ? ?Diagnosis ?F33.41 Major Depressive Disorder, recurrent, in remission ?F41.1  Generalized Anxiety Disorder ?Symptoms ?Decrease or loss of appetite.-- Skipping meals due to loss of appetite, or busy schedule  ?Depressed or irritable mood.-- Sad affect most of the time, easily losses patience  ?Diminished interest in or enjoyment of activities. -- Difficulties with motivation for normal everyday activities  ?Feelings of hopelessness, worthlessness, or inappropriate guilt.-- Familiy pressures making her feel guilty, disappointed in self  ?History of chronic or recurrent depression for which the client has taken antidepressant medication, been hospitalized, had outpatient treatment, or had a course of electroconvulsive therapy. -- Client and famiy describe long history of depression and previous suicide ideation and gestures  ?Lack of energy.-- Constant fatigue  ?Low self-esteem. ?Poor concentration and indecisiveness.  ?Medication Status ?na  ?Safety ?none  ?If Suicidal or Homicidal State Action Taken: unspecified  ?Current Risk: low ?Medications ?unspecified ?Objectives ?Related Problem: Balance life activities between consideration of others and development of own interests. ?Description: Identify values that guide life's decisions and determine fulfillment. ?Target Date: 2021-06-19 ?Frequency: Weekly ?Modality: individual ?Progress: 20%  ?Related Problem: Balance life activities between consideration of others and development of own interests. ?Description: Implement new activities that increase a sense of satisfaction. ?Target Date: 2021-06-19 ?Frequency: Weekly ?Modality: individual ?Progress: 20%  ?Related Problem: Balance life activities between consideration of others and development of own interests. ?Description: Implement  increased assertiveness to take control of conflicts. ?Target Date: 2021-06-19 ?Frequency: Weekly ?Modality: individual ?Progress: 10% ? ?Planned Intervention: Encourage the patient to use assertiveness skills and boundary setting application to the client's daily life.  ?Related Problem: Balance life activities between consideration of others and development of own interests. ?Description: Apply problem-solving skills to current circumstances. ?Target Date: 2021-06-19 ?Frequency: Weekly ?Modality: individual ?Progress: 10% ? ?Related Problem: Balance life activities between consideration of others and development of own interests. ?Description: Increase communication with significant others regarding current life stress factors. ?Target Date: 2021-06-19 ?Frequency: Weekly ?Modality: individual ?Progress: 10% ? ?Related Problem: Balance life activities between consideration of others and development of own interests. ?Description: Implement changes in time and effort allocation to restore balance to life. ?Target Date: 2021-06-19 ?Frequency: Weekly ?Modality: individual ?Progress: 0%  ?Related Problem: Balance life activities between consideration of others and development of own interests. ?Description: Increase social contacts to reduce sense of isolation. ?Target Date: 2021-06-19 ?Frequency: Weekly ?Modality: individual ?Progress: 0%  ?Related Problem: Balance life activities between consideration of others and development of own interests. ?Description: Share emotional struggles related to current adjustment stress. ?Target Date: 2021-06-19 ?Frequency: Weekly ?Modality: individual ?Progress: 60%  ?Related Problem: Balance life activities between consideration of others and development of own interests. ?Description: Significant others offer support to reduce the client's stress. ?Target Date: 2021-06-19 ?Frequency: Weekly ?Modality: individual ?Progress: 10%  ?Related Problem: Balance life activities between  consideration of others and development of own interests. ?Description: Describe the circumstances of life that are contributing to stress, anxiety, or lack of fulfillment. ?Target Date: 2021-06-19 ?Frequency: Weekly ?Modality: individual ?Progress: 50% ? ?Related Problem: Balance life activities between consideration of others and development of own interests. ?Description: Increase activities that reinforce a positive self-identity. ?Target Date: 2021-06-19 ?Frequency: Weekly ?Modality: individual ?Progress: 30%  ?Related Problem: Develop healthy interpersonal relationships that lead to the alleviation and help prevent the relapse of depression. ?  Description: Describe current and past experiences with depression including their impact on functioning and attempts to resolve it. ?Target Date: 2021-06-19 ?Frequency: Weekly ?Modality: individual ?Progress: 0% ? ?Client Response ?full compliance  ?Service Location ?Location, [address] [place], [place] [address]  ?Service Code ?cpt 99371I (95) ?Clarify interpersonal incident (IPT)  ?Identify/label emotions  ?Validate/empathize   ?Communication analysis (IPT)  ?Rationally challenge thoughts or beliefs/cognitive restructuring  ?Identify automatic thoughts  ?Lifestyle change (exercise, nutrition)  ?Self care activities ?Grief and Loss Work ?Comments  ? ? ?Today I met with Lynn Gonzales in remote video (WebEx) face to face individual psychotherapy as an accommodation during the pandemic.  ? ?Distance Site: Client's Home ?Originating Site: Dr Jannifer Franklin Remote Office ?Obtained consent to transmit remotely: Verbal ? ?Pronouns: She/Her  ? ?Lynn Gonzales reports that she had a few work situations that have been stressful.  We d/e/p these incidents, strategies and provided support.  Lynn Gonzales also was trying to decide on where to attend to get her [facility].  We d/e/p and conducted a decision analysis.  I encouraged Lynn Gonzales to trust her intuitions and listen to herself and let this  feedback inform her decision. ? ? ?Progress: Pt's mood remains stable at mild to moderate levels of depression, but she is managing to move forward and make independent decisions for her future.   ? ? ?Lynn Crochet, PhD ? ? ?

## 2021-06-15 ENCOUNTER — Ambulatory Visit (INDEPENDENT_AMBULATORY_CARE_PROVIDER_SITE_OTHER): Payer: BC Managed Care – PPO | Admitting: Psychology

## 2021-06-15 DIAGNOSIS — F3341 Major depressive disorder, recurrent, in partial remission: Secondary | ICD-10-CM

## 2021-06-15 NOTE — Progress Notes (Addendum)
? ?PROGRESS NOTE: ? ? ?Name: Lynn Gonzales ?Date: 06/15/2021 ?MRN: [id number] ?DOB: 01-09-1996 ?PCP: Pcp, No ? ?Start Time: 10:00 AM ?Stop Time: 10:55 AM ? ?Note: Annual Review 06-19-2021 ? ?Diagnosis ?F33.41 Major Depressive Disorder, recurrent, in remission ?F41.1  Generalized Anxiety Disorder ?Symptoms ?Decrease or loss of appetite.-- Skipping meals due to loss of appetite, or busy schedule  ?Depressed or irritable mood.-- Sad affect most of the time, easily losses patience  ?Diminished interest in or enjoyment of activities. -- Difficulties with motivation for normal everyday activities  ?Feelings of hopelessness, worthlessness, or inappropriate guilt.-- Familiy pressures making her feel guilty, disappointed in self  ?History of chronic or recurrent depression for which the client has taken antidepressant medication, been hospitalized, had outpatient treatment, or had a course of electroconvulsive therapy. -- Client and famiy describe long history of depression and previous suicide ideation and gestures  ?Lack of energy.-- Constant fatigue  ?Low self-esteem. ?Poor concentration and indecisiveness.  ?Medication Status ?na  ?Safety ?none  ?If Suicidal or Homicidal State Action Taken: unspecified  ?Current Risk: low ?Medications ?unspecified ?Objectives ?Related Problem: Balance life activities between consideration of others and development of own interests. ?Description: Identify values that guide life's decisions and determine fulfillment. ?Target Date: 2021-06-19 ?Frequency: Weekly ?Modality: individual ?Progress: 20%  ?Related Problem: Balance life activities between consideration of others and development of own interests. ?Description: Implement new activities that increase a sense of satisfaction. ?Target Date: 2021-06-19 ?Frequency: Weekly ?Modality: individual ?Progress: 20%  ?Related Problem: Balance life activities between consideration of others and development of own interests. ?Description: Implement  increased assertiveness to take control of conflicts. ?Target Date: 2021-06-19 ?Frequency: Weekly ?Modality: individual ?Progress: 10% ? ?Planned Intervention: Encourage the patient to use assertiveness skills and boundary setting application to the client's daily life.  ?Related Problem: Balance life activities between consideration of others and development of own interests. ?Description: Apply problem-solving skills to current circumstances. ?Target Date: 2021-06-19 ?Frequency: Weekly ?Modality: individual ?Progress: 10% ? ?Related Problem: Balance life activities between consideration of others and development of own interests. ?Description: Increase communication with significant others regarding current life stress factors. ?Target Date: 2021-06-19 ?Frequency: Weekly ?Modality: individual ?Progress: 10% ? ?Related Problem: Balance life activities between consideration of others and development of own interests. ?Description: Implement changes in time and effort allocation to restore balance to life. ?Target Date: 2021-06-19 ?Frequency: Weekly ?Modality: individual ?Progress: 0%  ?Related Problem: Balance life activities between consideration of others and development of own interests. ?Description: Increase social contacts to reduce sense of isolation. ?Target Date: 2021-06-19 ?Frequency: Weekly ?Modality: individual ?Progress: 0%  ?Related Problem: Balance life activities between consideration of others and development of own interests. ?Description: Share emotional struggles related to current adjustment stress. ?Target Date: 2021-06-19 ?Frequency: Weekly ?Modality: individual ?Progress: 60%  ?Related Problem: Balance life activities between consideration of others and development of own interests. ?Description: Significant others offer support to reduce the client's stress. ?Target Date: 2021-06-19 ?Frequency: Weekly ?Modality: individual ?Progress: 10%  ?Related Problem: Balance life activities between  consideration of others and development of own interests. ?Description: Describe the circumstances of life that are contributing to stress, anxiety, or lack of fulfillment. ?Target Date: 2021-06-19 ?Frequency: Weekly ?Modality: individual ?Progress: 50% ? ?Related Problem: Balance life activities between consideration of others and development of own interests. ?Description: Increase activities that reinforce a positive self-identity. ?Target Date: 2021-06-19 ?Frequency: Weekly ?Modality: individual ?Progress: 30%  ?Related Problem: Develop healthy interpersonal relationships that lead to the alleviation and help prevent the relapse of  depression. ?Description: Describe current and past experiences with depression including their impact on functioning and attempts to resolve it. ?Target Date: 2021-06-19 ?Frequency: Weekly ?Modality: individual ?Progress: 0% ? ?Client Response ?full compliance  ?Service Location ?Location, [address] [place], [place] [address]  ?Service Code ?cpt 34373H (95) ?Clarify interpersonal incident (IPT)  ?Identify/label emotions  ?Validate/empathize   ?Communication analysis (IPT)  ?Rationally challenge thoughts or beliefs/cognitive restructuring  ?Identify automatic thoughts  ?Lifestyle change (exercise, nutrition)  ?Self care activities ?Grief and Loss Work ?Comments  ? ? ?Today I met with Lynn Gonzales in remote video (WebEx) face to face individual psychotherapy as an accommodation during the pandemic.  ? ?Distance Site: Client's Home ?Originating Site: Dr Jannifer Franklin Remote Office ?Obtained consent to transmit remotely: Verbal ? ?Pronouns: She/Her  ? ?Lynn Gonzales reports that she had a nice weekend in [place] with her husband attending a friend's wedding.  We d/e/p what it was like to travel for the first time as a couple without their son in tow. ? ?Lynn Gonzales is still debating where to attend school for her [employer].  Today we were able to go deeper into her anxieties about starting school  again, worries about doing well, fears about wasting peoples time/sacrifice and anxiety about similar troubles she experienced in the past.  I helped  her to show some self compassion given her past post-graduate school traumas and to gain some perspective around how this is a very different experience.  She took comfort from this new perspective. ? ? ?Progress: Pt's mood remains stable at mild to moderate levels of depression, but she is managing to move forward and make independent decisions for her future.   ? ? ?Lynn Crochet, PhD ? ? ?

## 2021-06-18 ENCOUNTER — Ambulatory Visit (INDEPENDENT_AMBULATORY_CARE_PROVIDER_SITE_OTHER): Payer: BC Managed Care – PPO | Admitting: Psychology

## 2021-06-18 DIAGNOSIS — F3341 Major depressive disorder, recurrent, in partial remission: Secondary | ICD-10-CM | POA: Diagnosis not present

## 2021-06-18 NOTE — Progress Notes (Incomplete Revision)
?PROGRESS NOTE: ? ? ?Name: Lynn Gonzales ?Date: 06/18/2021 ?MRN: [id number] ?DOB: March 07, 1995 ?PCP: Pcp, No ? ?Start Time: 11:00 AM ?Stop Time: 11:55 AM ? ? ?Note: Annual Review 06-20-2022 ? ? ?Today I met with  Lynn Gonzales in remote video (WebEx) face-to-face individual psychotherapy as an accomodation to the COVID-19 Pandemic. ? ?Distance Site: Client's Home ?Orginating Site: Dr Jannifer Franklin Remote Office ?Consent: Obtained verbal consent to transmit  session remotely  ? ? ?Diagnosis ?F33.41 Major Depressive Disorder, recurrent, in remission ?F41.1  Generalized Anxiety Disorder ? ? ?Individualized Treatment Plan     ?Strengths: Bright, verbal, motivated and resourceful  ?Supports:  spouse and family  ? ?Goal/Needs for Treatment:  ?In order of importance to patient ?1) Encourage the patient to use assertiveness skills and boundary setting application to the client's daily life. ?  ?2) Develop healthy interpersonal relationships that lead to the alleviation and help prevent the relapse of depression. ? ?3) Balance life activities between consideration of others and development of own interests. ?  ? ?Client Statement of Needs: requires support, guidance and skills to manage emotional, family and work struggles as well as for life transitions.  ? ?Treatment Level: Outpatient Individual Psychotherapy  ?Symptoms:  ?Decrease or loss of appetite.-- Skipping meals due to loss of appetite, or busy schedule  ?Depressed or irritable mood.-- Sad affect most of the time, easily losses patience  ?Diminished interest in or enjoyment of activities. -- Difficulties with motivation for normal everyday activities  ?Feelings of hopelessness, worthlessness, or inappropriate guilt.-- Familiy pressures making her feel guilty, disappointed in self  ?History of chronic or recurrent depression for which the client has taken antidepressant medication, had outpatient treatment.  ?Lack of energy.-- frequent fatigue  ?Low self-esteem. ?Poor  concentration and indecisiveness.   ?Client Treatment Preferences: to continue with present therapist  ? ?Healthcare consumer's goal for treatment: ? ?Psychologist, [name], Ph.D.,  will support the patient's ability to achieve the goals identified. Cognitive Behavioral Therapy, Dialectical Behavioral Therapy, Motivational Interviewing, parent training, and other evidenced-based practices will be used to promote progress towards healthy functioning.  ? ?Healthcare consumer [name] will: Actively participate in therapy, working towards healthy functioning.  ?  ?*Justification for Continuation/Discontinuation of Goal: R=Revised, O=Ongoing, A=Achieved, D=Discontinued ? ?Goal 1) Learn and apply problem-solving skills to current circumstances in order to maintain positive mood states. ?5-Point Likert rating baseline date: 06/19/2021 ?Target Date Goal Was reviewed Status Code Progress towards goal/Likert rating  ?06/20/2022 06/19/2021         O 4/5 - Pt has learned and is implementing p/s skills that support positive mood states  ?     ?     ? ?Goal 2) Develop healthy interpersonal relationships that lead to the alleviation and help prevent the relapse of depression. ?5-Point Likert rating baseline date: 06/19/2021 ?Target Date Goal Was reviewed Status Code 3/5 - Progress towards goal/Likert rating  ?06/20/2022 06/19/2021          O Pt has learned interpersonal skills that supporting positive mood states  ?     ?     ? ?Goal 3) Learn and apply coping skills to current circumstances in order to maintain positive mood states. ? ?5-Point Likert rating baseline date: 06/19/2021 ?Target Date Goal Was reviewed Status Code Progress towards goal/Likert rating  ?06/20/2022 06/19/2021            O 2/5 - Pt   ?     ?     ? ? ? ?  This plan has been reviewed and created by the following participants:  This plan will be reviewed at least every 12 months. ?Date Behavioral Health Clinician Date Guardian/Patient    ?06/19/2021 [name], Ph.D.  06/19/2021 Lynn Gonzales  ?     ?     ?     ?  ? ?Lynn Gonzales reports that it was a difficult week for her.  We d/e/p some work issues that have made work very stressful.  We also d/e/p family issues and dynamics which got complicated while her sister was in town.   ? ?[name], PhD ? ? ?

## 2021-06-18 NOTE — Progress Notes (Signed)
?PROGRESS NOTE: ? ? ?Name: Lynn Gonzales ?Date: 06/18/2021 ?MRN: [id number] ?DOB: Sep 14, 1995 ?PCP: Pcp, No ? ?Start Time: 11:00 AM ?Stop Time: 11:55 AM ? ? ?Note: Annual Review 06-20-2022 ? ? ?Today I met with  [name] in remote video (WebEx) face-to-face individual psychotherapy as an accomodation to the COVID-19 Pandemic. ? ?Distance Site: Client's Home ?Orginating Site: Dr Odette Horns Remote Office ?Consent: Obtained verbal consent to transmit  session remotely  ? ? ?In session today, Aldona and I d/ goals, reviewed progress and updated her treatment plan.  Estill Bamberg participated, contributed and gave consent to the new treatment plan. ? ? ?Diagnosis ?F33.41 Major Depressive Disorder, recurrent, in remission ?F41.1  Generalized Anxiety Disorder ? ? ?Individualized Treatment Plan     ?Strengths: Bright, verbal, motivated and resourceful  ?Supports:  spouse and family  ? ?Goal/Needs for Treatment:  ?In order of importance to patient ?1) Encourage the patient to use assertiveness skills and boundary setting application to the client's daily life. ?  ?2) Develop healthy interpersonal relationships that lead to the alleviation and help prevent the relapse of depression. ? ?3) Balance life activities between consideration of others and development of own interests. ?  ? ?Client Statement of Needs: requires support, guidance and skills to manage emotional, family and work struggles as well as for life transitions.  ? ?Treatment Level: Outpatient Individual Psychotherapy  ?Symptoms:  ?Decrease or loss of appetite.-- Skipping meals due to loss of appetite, or busy schedule  ?Depressed or irritable mood.-- Sad affect most of the time, easily losses patience  ?Diminished interest in or enjoyment of activities. -- Difficulties with motivation for normal everyday activities  ?Feelings of hopelessness, worthlessness, or inappropriate guilt.-- Familiy pressures making her feel guilty, disappointed in self  ?History of chronic  or recurrent depression for which the client has taken antidepressant medication, had outpatient treatment.  ?Lack of energy.-- frequent fatigue  ?Low self-esteem. ?Poor concentration and indecisiveness.   ?Client Treatment Preferences: to continue with present therapist  ? ?Healthcare consumer's goal for treatment: ? ?Psychologist, Hilma Favors, Ph.D.,  will support the patient's ability to achieve the goals identified. Cognitive Behavioral Therapy, Dialectical Behavioral Therapy, Motivational Interviewing, parent training, and other evidenced-based practices will be used to promote progress towards healthy functioning.  ? ?Healthcare consumer Deonna Krummel will: Actively participate in therapy, working towards healthy functioning.  ?  ?*Justification for Continuation/Discontinuation of Goal: R=Revised, O=Ongoing, A=Achieved, D=Discontinued ? ?Goal 1) Learn and apply problem-solving skills to current circumstances in order to maintain positive mood states. ?5-Point Likert rating baseline date: 06/19/2021 ?Target Date Goal Was reviewed Status Code Progress towards goal/Likert rating  ?06/20/2022 06/19/2021         O 4/5 - Pt has learned and is implementing p/s skills that support positive mood states  ?     ?     ? ?Goal 2) Develop healthy interpersonal relationships that lead to the alleviation and help prevent the relapse of depression. ?5-Point Likert rating baseline date: 06/19/2021 ?Target Date Goal Was reviewed Status Code 3/5 - Progress towards goal/Likert rating  ?06/20/2022 06/19/2021          O Pt has learned interpersonal skills that supporting positive mood states  ?     ?     ? ?Goal 3) Learn and apply coping skills to current circumstances in order to maintain positive mood states. ? ?5-Point Likert rating baseline date: 06/19/2021 ?Target Date Goal Was reviewed Status Code Progress towards goal/Likert rating  ?06/20/2022 06/19/2021  O 2/5 - Pt   ?     ?     ? ? ? ?This plan has been  reviewed and created by the following participants:  This plan will be reviewed at least every 12 months. ?Date Behavioral Health Clinician Date Guardian/Patient   ?06/19/2021 [name], Ph.D.  06/19/2021 [name]  ?     ?     ?     ?  ? ?[name], PhD ? ? ?

## 2021-06-22 ENCOUNTER — Ambulatory Visit (INDEPENDENT_AMBULATORY_CARE_PROVIDER_SITE_OTHER): Payer: BC Managed Care – PPO | Admitting: Psychology

## 2021-06-22 DIAGNOSIS — F3341 Major depressive disorder, recurrent, in partial remission: Secondary | ICD-10-CM

## 2021-06-22 NOTE — Addendum Note (Signed)
Addended by: Marin Olp ANN on: 06/22/2021 10:28 AM ? ? Modules accepted: Level of Service ? ?

## 2021-06-22 NOTE — Progress Notes (Signed)
?PROGRESS NOTE: ? ? ?Name: Lynn Gonzales ?Date: 06/22/2021 ?MRN: [id number] ?DOB: 1996-01-30 ?PCP: Pcp, No ? ?Start Time: 11:00 AM ?Stop Time: 11:55 AM ? ? ?Note: Annual Review 06-20-2022 ? ? ?Today I met with  Lynn Gonzales in remote video (WebEx) face-to-face individual psychotherapy as an accomodation to the COVID-19 Pandemic. ? ?Distance Site: Client's Home ?Orginating Site: Dr Jannifer Franklin Remote Office ?Consent: Obtained verbal consent to transmit  session remotely  ? ? ?Diagnosis ?F33.41 Major Depressive Disorder, recurrent, in remission ?F41.1  Generalized Anxiety Disorder ? ? ?Individualized Treatment Plan     ?Strengths: Bright, verbal, motivated and resourceful  ?Supports:  spouse and family  ? ?Goal/Needs for Treatment:  ?In order of importance to patient ?1) Encourage the patient to use assertiveness skills and boundary setting application to the client's daily life. ?  ?2) Develop healthy interpersonal relationships that lead to the alleviation and help prevent the relapse of depression. ? ?3) Balance life activities between consideration of others and development of own interests. ?  ? ?Client Statement of Needs: requires support, guidance and skills to manage emotional, family and work struggles as well as for life transitions.  ? ?Treatment Level: Outpatient Individual Psychotherapy  ?Symptoms:  ?Decrease or loss of appetite.-- Skipping meals due to loss of appetite, or busy schedule  ?Depressed or irritable mood.-- Sad affect most of the time, easily losses patience  ?Diminished interest in or enjoyment of activities. -- Difficulties with motivation for normal everyday activities  ?Feelings of hopelessness, worthlessness, or inappropriate guilt.-- Familiy pressures making her feel guilty, disappointed in self  ?History of chronic or recurrent depression for which the client has taken antidepressant medication, had outpatient treatment.  ?Lack of energy.-- frequent fatigue  ?Low self-esteem. ?Poor  concentration and indecisiveness.   ?Client Treatment Preferences: to continue with present therapist  ? ?Healthcare consumer's goal for treatment: ? ?Psychologist, [name], Ph.D.,  will support the patient's ability to achieve the goals identified. Cognitive Behavioral Therapy, Dialectical Behavioral Therapy, Motivational Interviewing, parent training, and other evidenced-based practices will be used to promote progress towards healthy functioning.  ? ?Healthcare consumer Lynn Gonzales will: Actively participate in therapy, working towards healthy functioning.  ?  ?*Justification for Continuation/Discontinuation of Goal: R=Revised, O=Ongoing, A=Achieved, D=Discontinued ? ?Goal 1) Learn and apply problem-solving skills to current circumstances in order to maintain positive mood states. ?5-Point Likert rating baseline date: 06/19/2021 ?Target Date Goal Was reviewed Status Code Progress towards goal/Likert rating  ?06/20/2022 06/19/2021         O 4/5 - Pt has learned and is implementing p/s skills that support positive mood states  ?     ?     ? ?Goal 2) Develop healthy interpersonal relationships that lead to the alleviation and help prevent the relapse of depression. ?5-Point Likert rating baseline date: 06/19/2021 ?Target Date Goal Was reviewed Status Code 3/5 - Progress towards goal/Likert rating  ?06/20/2022 06/19/2021          O Pt has learned interpersonal skills that supporting positive mood states  ?     ?     ? ?Goal 3) Learn and apply coping skills to current circumstances in order to maintain positive mood states. ? ?5-Point Likert rating baseline date: 06/19/2021 ?Target Date Goal Was reviewed Status Code Progress towards goal/Likert rating  ?06/20/2022 06/19/2021            O 2/5 - Pt   ?     ?     ? ? ? ?  This plan has been reviewed and created by the following participants:  This plan will be reviewed at least every 12 months. ?Date Behavioral Health Clinician Date Guardian/Patient    ?06/19/2021 [name], Ph.D.  06/19/2021 Lynn Gonzales  ?     ?     ?     ?  ? ?Lynn Gonzales reports that it was a tough weekend and today started out stressfully because of work developments.  We d/e/p all that was happening in her family life and work that has been causing her stress and disappointment.  We d/e and p/s issues related to her home life which she has more control over.  We also d/e/p some of the issues she is having with her husband and the difficulties she is having knowing what is at the root of his problems. ? ? ?[name], PhD ? ? ? ? ? ? ? ? ? ? ? ? ? ? ? ? ?[name], PhD ?

## 2021-06-24 NOTE — Addendum Note (Signed)
Addended by: Marin Olp ANN on: 06/24/2021 02:00 PM ? ? Modules accepted: Level of Service ? ?

## 2021-06-25 ENCOUNTER — Ambulatory Visit (INDEPENDENT_AMBULATORY_CARE_PROVIDER_SITE_OTHER): Payer: BC Managed Care – PPO | Admitting: Psychology

## 2021-06-25 DIAGNOSIS — F3341 Major depressive disorder, recurrent, in partial remission: Secondary | ICD-10-CM | POA: Diagnosis not present

## 2021-06-25 NOTE — Progress Notes (Signed)
?PROGRESS NOTE: ? ? ?Name: Lynn Gonzales ?Date: 06/25/2021 ?MRN: [id number] ?DOB: 1995/03/12 ?PCP: Pcp, No ? ?Start Time: 11:00 AM ?Stop Time: 11:55 AM ? ? ?Note: Annual Review 06-20-2022 ? ? ?Today I met with  Lynn Gonzales in remote video (WebEx) face-to-face individual psychotherapy as an accomodation to the COVID-19 Pandemic. ? ?Distance Site: Client's Home ?Orginating Site: Dr Jannifer Franklin Remote Office ?Consent: Obtained verbal consent to transmit  session remotely  ? ? ?Diagnosis ?F33.41 Major Depressive Disorder, recurrent, in remission ?F41.1  Generalized Anxiety Disorder ? ? ?Individualized Treatment Plan     ?Strengths: Bright, verbal, motivated and resourceful  ?Supports:  spouse and family  ? ?Goal/Needs for Treatment:  ?In order of importance to patient ?1) Encourage the patient to use assertiveness skills and boundary setting application to the client's daily life. ?  ?2) Develop healthy interpersonal relationships that lead to the alleviation and help prevent the relapse of depression. ? ?3) Balance life activities between consideration of others and development of own interests. ?  ? ?Client Statement of Needs: requires support, guidance and skills to manage emotional, family and work struggles as well as for life transitions.  ? ?Treatment Level: Outpatient Individual Psychotherapy  ?Symptoms:  ?Decrease or loss of appetite.-- Skipping meals due to loss of appetite, or busy schedule  ?Depressed or irritable mood.-- Sad affect most of the time, easily losses patience  ?Diminished interest in or enjoyment of activities. -- Difficulties with motivation for normal everyday activities  ?Feelings of hopelessness, worthlessness, or inappropriate guilt.-- Familiy pressures making her feel guilty, disappointed in self  ?History of chronic or recurrent depression for which the client has taken antidepressant medication, had outpatient treatment.  ?Lack of energy.-- frequent fatigue  ?Low self-esteem. ?Poor  concentration and indecisiveness.   ?Client Treatment Preferences: to continue with present therapist  ? ?Healthcare consumer's goal for treatment: ? ?Psychologist, [name], Ph.D.,  will support the patient's ability to achieve the goals identified. Cognitive Behavioral Therapy, Dialectical Behavioral Therapy, Motivational Interviewing, parent training, and other evidenced-based practices will be used to promote progress towards healthy functioning.  ? ?Healthcare consumer Lynn Gonzales will: Actively participate in therapy, working towards healthy functioning.  ?  ?*Justification for Continuation/Discontinuation of Goal: R=Revised, O=Ongoing, A=Achieved, D=Discontinued ? ?Goal 1) Learn and apply problem-solving skills to current circumstances in order to maintain positive mood states. ?5-Point Likert rating baseline date: 06/19/2021 ?Target Date Goal Was reviewed Status Code Progress towards goal/Likert rating  ?06/20/2022 06/19/2021         O 4/5 - Pt has learned and is implementing p/s skills that support positive mood states  ?     ?     ? ?Goal 2) Develop healthy interpersonal relationships that lead to the alleviation and help prevent the relapse of depression. ?5-Point Likert rating baseline date: 06/19/2021 ?Target Date Goal Was reviewed Status Code 3/5 - Progress towards goal/Likert rating  ?06/20/2022 06/19/2021          O Pt has learned interpersonal skills that supporting positive mood states  ?     ?     ? ?Goal 3) Learn and apply coping skills to current circumstances in order to maintain positive mood states. ? ?5-Point Likert rating baseline date: 06/19/2021 ?Target Date Goal Was reviewed Status Code Progress towards goal/Likert rating  ?06/20/2022 06/19/2021            O 2/5 - Pt   ?     ?     ? ? ? ?  This plan has been reviewed and created by the following participants:  This plan will be reviewed at least every 12 months. ?Date Behavioral Health Clinician Date Guardian/Patient    ?06/19/2021 [name], Ph.D.  06/19/2021 Lynn Gonzales  ?     ?     ?     ?  ? ?Lynn Gonzales reports that work continues to be stressful.  She had to release another of employees because of budgetary shortages.  We d/e/p the issues she is contending with at work.  I encouraged her to prioritize as a way to avoid burnout. ? ?Arrion's sister was in town for a visit.  Her twin also has some big changes happening.  We d/e/p the issues she encountered, what she thought, felt and next steps.  ?  ? ?[name], PhD ? ? ? ? ?

## 2021-06-29 ENCOUNTER — Ambulatory Visit (INDEPENDENT_AMBULATORY_CARE_PROVIDER_SITE_OTHER): Payer: PRIVATE HEALTH INSURANCE | Admitting: Psychology

## 2021-06-29 DIAGNOSIS — F3341 Major depressive disorder, recurrent, in partial remission: Secondary | ICD-10-CM | POA: Diagnosis not present

## 2021-06-29 NOTE — Progress Notes (Signed)
?PROGRESS NOTE: ? ? ?Name: Lynn Gonzales ?Date: 06/29/2021 ?MRN: [id number] ?DOB: December 29, 1995 ?PCP: Pcp, No ? ?Start Time: 11:00 AM ?Stop Time: 11:55 AM ? ? ?Note: Annual Review 06-20-2022 ? ? ?Today I met with  Lynn Gonzales in remote video (WebEx) face-to-face individual psychotherapy as an accomodation to the COVID-19 Pandemic. ? ?Distance Site: Client's Home ?Orginating Site: Dr Jannifer Franklin Remote Office ?Consent: Obtained verbal consent to transmit  session remotely  ? ? ?Diagnosis ?F33.41 Major Depressive Disorder, recurrent, in remission ?F41.1  Generalized Anxiety Disorder ? ? ?Individualized Treatment Plan     ?Strengths: Bright, verbal, motivated and resourceful  ?Supports:  spouse and family  ? ?Goal/Needs for Treatment:  ?In order of importance to patient ?1) Encourage the patient to use assertiveness skills and boundary setting application to the client's daily life. ?  ?2) Develop healthy interpersonal relationships that lead to the alleviation and help prevent the relapse of depression. ? ?3) Balance life activities between consideration of others and development of own interests. ?  ? ?Client Statement of Needs: requires support, guidance and skills to manage emotional, family and work struggles as well as for life transitions.  ? ?Treatment Level: Outpatient Individual Psychotherapy  ?Symptoms:  ?Decrease or loss of appetite.-- Skipping meals due to loss of appetite, or busy schedule  ?Depressed or irritable mood.-- Sad affect most of the time, easily losses patience  ?Diminished interest in or enjoyment of activities. -- Difficulties with motivation for normal everyday activities  ?Feelings of hopelessness, worthlessness, or inappropriate guilt.-- Familiy pressures making her feel guilty, disappointed in self  ?History of chronic or recurrent depression for which the client has taken antidepressant medication, had outpatient treatment.  ?Lack of energy.-- frequent fatigue  ?Low self-esteem. ?Poor  concentration and indecisiveness.   ?Client Treatment Preferences: to continue with present therapist  ? ?Healthcare consumer's goal for treatment: ? ?Psychologist, [name], Ph.D.,  will support the patient's ability to achieve the goals identified. Cognitive Behavioral Therapy, Dialectical Behavioral Therapy, Motivational Interviewing, parent training, and other evidenced-based practices will be used to promote progress towards healthy functioning.  ? ?Healthcare consumer Sonya Gunnoe will: Actively participate in therapy, working towards healthy functioning.  ?  ?*Justification for Continuation/Discontinuation of Goal: R=Revised, O=Ongoing, A=Achieved, D=Discontinued ? ?Goal 1) Learn and apply problem-solving skills to current circumstances in order to maintain positive mood states. ?5-Point Likert rating baseline date: 06/19/2021 ?Target Date Goal Was reviewed Status Code Progress towards goal/Likert rating  ?06/20/2022 06/19/2021         O 4/5 - Pt has learned and is implementing p/s skills that support positive mood states  ?     ?     ? ?Goal 2) Develop healthy interpersonal relationships that lead to the alleviation and help prevent the relapse of depression. ?5-Point Likert rating baseline date: 06/19/2021 ?Target Date Goal Was reviewed Status Code 3/5 - Progress towards goal/Likert rating  ?06/20/2022 06/19/2021          O Pt has learned interpersonal skills that supporting positive mood states  ?     ?     ? ?Goal 3) Learn and apply coping skills to current circumstances in order to maintain positive mood states. ? ?5-Point Likert rating baseline date: 06/19/2021 ?Target Date Goal Was reviewed Status Code Progress towards goal/Likert rating  ?06/20/2022 06/19/2021            O 2/5 - Pt   ?     ?     ? ? ? ?  This plan has been reviewed and created by the following participants:  This plan will be reviewed at least every 12 months. ?Date Behavioral Health Clinician Date Guardian/Patient    ?06/19/2021 [name], Ph.D.  06/19/2021 Lynn Gonzales  ?     ?     ?     ?  ? ?Lynn Gonzales reports that family life continues to be stressful.  Lynn Gonzales's twin sister has some big changes happening and she has asked for her advise.  We d/e/p how difficult it can be to be at a different point in her life emotionally, mentally and spiritually than her sisters.  We d/e/p that what she often gets is a lot of negative messages instead of the uplift and the recognition she needs from her husband and family.  We d/ ways to manage this negative messaging and ways to counteract this negativity (ie., personalizing her mother's and sister's negative messages, journaling, etc.). ? ?  ? ?[name], PhD ? ?

## 2021-07-02 ENCOUNTER — Ambulatory Visit (INDEPENDENT_AMBULATORY_CARE_PROVIDER_SITE_OTHER): Payer: BC Managed Care – PPO | Admitting: Psychology

## 2021-07-02 DIAGNOSIS — F3341 Major depressive disorder, recurrent, in partial remission: Secondary | ICD-10-CM

## 2021-07-02 NOTE — Progress Notes (Signed)
PROGRESS NOTE:   Name: Lynn Gonzales Date: 07/02/2021 MRN: [id number] DOB: 1995-06-18 PCP: Pcp, No  Start Time: 11:00 AM Stop Time: 11:55 AM   Note: Annual Review 06-20-2022   Today I met with  Lynn Gonzales in remote video (WebEx) face-to-face individual psychotherapy as an accomodation to the COVID-19 Pandemic.  Distance Site: Client's Home Orginating Site: Dr Jannifer Franklin Remote Office Consent: Obtained verbal consent to transmit  session remotely    Diagnosis F33.41 Major Depressive Disorder, recurrent, in remission F41.1  Generalized Anxiety Disorder   Individualized Treatment Plan     Strengths: Bright, verbal, motivated and resourceful  Supports:  spouse and family   Goal/Needs for Treatment:  In order of importance to patient 1) Encourage the patient to use assertiveness skills and boundary setting application to the client's daily life.   2) Develop healthy interpersonal relationships that lead to the alleviation and help prevent the relapse of depression.  3) Balance life activities between consideration of others and development of own interests.    Client Statement of Needs: requires support, guidance and skills to manage emotional, family and work struggles as well as for life transitions.   Treatment Level: Outpatient Individual Psychotherapy  Symptoms:  Decrease or loss of appetite.-- Skipping meals due to loss of appetite, or busy schedule  Depressed or irritable mood.-- Sad affect most of the time, easily losses patience  Diminished interest in or enjoyment of activities. -- Difficulties with motivation for normal everyday activities  Feelings of hopelessness, worthlessness, or inappropriate guilt.-- Familiy pressures making her feel guilty, disappointed in self  History of chronic or recurrent depression for which the client has taken antidepressant medication, had outpatient treatment.  Lack of energy.-- frequent fatigue  Low self-esteem. Poor  concentration and indecisiveness.   Client Treatment Preferences: to continue with present therapist   Healthcare consumer's goal for treatment:  Psychologist, [name], Ph.D.,  will support the patient's ability to achieve the goals identified. Cognitive Behavioral Therapy, Dialectical Behavioral Therapy, Motivational Interviewing, parent training, and other evidenced-based practices will be used to promote progress towards healthy functioning.   Healthcare consumer Lynn Gonzales will: Actively participate in therapy, working towards healthy functioning.    *Justification for Continuation/Discontinuation of Goal: R=Revised, O=Ongoing, A=Achieved, D=Discontinued  Goal 1) Learn and apply problem-solving skills to current circumstances in order to maintain positive mood states. 5-Point Likert rating baseline date: 06/19/2021 Target Date Goal Was reviewed Status Code Progress towards goal/Likert rating  06/20/2022 06/19/2021         O 4/5 - Pt has learned and is implementing p/s skills that support positive mood states             Goal 2) Develop healthy interpersonal relationships that lead to the alleviation and help prevent the relapse of depression. 5-Point Likert rating baseline date: 06/19/2021 Target Date Goal Was reviewed Status Code 3/5 - Progress towards goal/Likert rating  06/20/2022 06/19/2021          O Pt has learned interpersonal skills that supporting positive mood states             Goal 3) Learn and apply coping skills to current circumstances in order to maintain positive mood states.  5-Point Likert rating baseline date: 06/19/2021 Target Date Goal Was reviewed Status Code Progress towards goal/Likert rating  06/20/2022 06/19/2021            O 2/5 - Pt  This plan has been reviewed and created by the following participants:  This plan will be reviewed at least every 12 months. Date Behavioral Health Clinician Date Guardian/Patient    06/19/2021 [name], Ph.D.  06/19/2021 Lynn Gonzales reports that she feels like she is over thinking.  We d/e/p what she is worrying about, identified her anxious thoughts and d/ how to better manage using self talk.  I helped her to step back, take some perspective and contextualize her experience.  Lynn Gonzales shared that communication between her and her husband is improving.  She shared a situation what was frustrating but they were able to d/ where things went wrong and how to do better next time.    [name], PhD                [name], PhD

## 2021-07-06 ENCOUNTER — Ambulatory Visit: Payer: PRIVATE HEALTH INSURANCE | Admitting: Psychology

## 2021-07-09 ENCOUNTER — Ambulatory Visit: Payer: PRIVATE HEALTH INSURANCE | Admitting: Psychology

## 2021-07-16 ENCOUNTER — Ambulatory Visit (INDEPENDENT_AMBULATORY_CARE_PROVIDER_SITE_OTHER): Payer: BC Managed Care – PPO | Admitting: Psychology

## 2021-07-16 DIAGNOSIS — F3341 Major depressive disorder, recurrent, in partial remission: Secondary | ICD-10-CM | POA: Diagnosis not present

## 2021-07-16 NOTE — Progress Notes (Signed)
PROGRESS NOTE:   Name: Lynn Gonzales Date: 07/16/2021 MRN: [id number] DOB: August 08, 1995 PCP: Pcp, No  Start Time: 11:00 AM Stop Time: 11:55 AM   Note: Annual Review 06-20-2022   Today I met with  Lynn Gonzales in remote video (WebEx) face-to-face individual psychotherapy as an accomodation to the COVID-19 Pandemic.  Distance Site: Client's Home Orginating Site: Dr Jannifer Franklin Remote Office Consent: Obtained verbal consent to transmit  session remotely    Diagnosis F33.41 Major Depressive Disorder, recurrent, in remission F41.1  Generalized Anxiety Disorder   Individualized Treatment Plan     Strengths: Bright, verbal, motivated and resourceful  Supports:  spouse and family   Goal/Needs for Treatment:  In order of importance to patient 1) Encourage the patient to use assertiveness skills and boundary setting application to the client's daily life.   2) Develop healthy interpersonal relationships that lead to the alleviation and help prevent the relapse of depression.  3) Balance life activities between consideration of others and development of own interests.    Client Statement of Needs: requires support, guidance and skills to manage emotional, family and work struggles as well as for life transitions.   Treatment Level: Outpatient Individual Psychotherapy  Symptoms:  Decrease or loss of appetite.-- Skipping meals due to loss of appetite, or busy schedule  Depressed or irritable mood.-- Sad affect most of the time, easily losses patience  Diminished interest in or enjoyment of activities. -- Difficulties with motivation for normal everyday activities  Feelings of hopelessness, worthlessness, or inappropriate guilt.-- Familiy pressures making her feel guilty, disappointed in self  History of chronic or recurrent depression for which the client has taken antidepressant medication, had outpatient treatment.  Lack of energy.-- frequent fatigue  Low self-esteem. Poor  concentration and indecisiveness.   Client Treatment Preferences: to continue with present therapist   Healthcare consumer's goal for treatment:  Psychologist, Lynn Gonzales, Ph.D.,  will support the patient's ability to achieve the goals identified. Cognitive Behavioral Therapy, Dialectical Behavioral Therapy, Motivational Interviewing, parent training, and other evidenced-based practices will be used to promote progress towards healthy functioning.   Healthcare consumer Lynn Gonzales will: Actively participate in therapy, working towards healthy functioning.    *Justification for Continuation/Discontinuation of Goal: R=Revised, O=Ongoing, A=Achieved, D=Discontinued  Goal 1) Learn and apply problem-solving skills to current circumstances in order to maintain positive mood states. 5-Point Likert rating baseline date: 06/19/2021 Target Date Goal Was reviewed Status Code Progress towards goal/Likert rating  06/20/2022 06/19/2021         O 4/5 - Pt has learned and is implementing p/s skills that support positive mood states             Goal 2) Develop healthy interpersonal relationships that lead to the alleviation and help prevent the relapse of depression. 5-Point Likert rating baseline date: 06/19/2021 Target Date Goal Was reviewed Status Code 3/5 - Progress towards goal/Likert rating  06/20/2022 06/19/2021          O Pt has learned interpersonal skills that supporting positive mood states             Goal 3) Learn and apply coping skills to current circumstances in order to maintain positive mood states.  5-Point Likert rating baseline date: 06/19/2021 Target Date Goal Was reviewed Status Code Progress towards goal/Likert rating  06/20/2022 06/19/2021            O 2/5 - Pt  This plan has been reviewed and created by the following participants:  This plan will be reviewed at least every 12 months. Date Behavioral Health Clinician Date Guardian/Patient    06/19/2021 Lynn Gonzales, Ph.D.  06/19/2021 Lynn Gonzales reports that it was a difficult week.  We d/e/p the three main problems she was having to deal with - troubles with her husband related to their relationship with her in-laws, work stresses and her sister's manic acting out.  We d/e/p her complicated relationship with her sister and how to provide support and still maintain healthy boundaries.  Lynn Gonzales shared that communication between her and her husband is improving.  She shared a situation what was frustrating but they were able to d/ where things went wrong and how to do better next time.    Lynn Crochet, PhD                 Lynn Crochet, PhD

## 2021-07-20 ENCOUNTER — Ambulatory Visit (INDEPENDENT_AMBULATORY_CARE_PROVIDER_SITE_OTHER): Payer: BC Managed Care – PPO | Admitting: Psychology

## 2021-07-20 DIAGNOSIS — F3341 Major depressive disorder, recurrent, in partial remission: Secondary | ICD-10-CM

## 2021-07-20 NOTE — Progress Notes (Signed)
PROGRESS NOTE:   Name: Lynn Gonzales Date: 07/20/2021 MRN: [id number] DOB: 1995-10-24 PCP: Pcp, No  Start Time: 10:00 AM Stop Time: 10:55 AM   Note: Annual Review 06-20-2022   Today I met with  Lynn Gonzales in remote video (WebEx) face-to-face individual psychotherapy as an accomodation to the COVID-19 Pandemic.  Distance Site: Client's Home Orginating Site: Dr Jannifer Franklin Remote Office Consent: Obtained verbal consent to transmit  session remotely    Diagnosis F33.41 Major Depressive Disorder, recurrent, in remission F41.1  Generalized Anxiety Disorder   Individualized Treatment Plan     Strengths: Bright, verbal, motivated and resourceful  Supports:  spouse and family   Goal/Needs for Treatment:  In order of importance to patient 1) Encourage the patient to use assertiveness skills and boundary setting application to the client's daily life.   2) Develop healthy interpersonal relationships that lead to the alleviation and help prevent the relapse of depression.  3) Balance life activities between consideration of others and development of own interests.    Client Statement of Needs: requires support, guidance and skills to manage emotional, family and work struggles as well as for life transitions.   Treatment Level: Outpatient Individual Psychotherapy  Symptoms:  Decrease or loss of appetite.-- Skipping meals due to loss of appetite, or busy schedule  Depressed or irritable mood.-- Sad affect most of the time, easily losses patience  Diminished interest in or enjoyment of activities. -- Difficulties with motivation for normal everyday activities  Feelings of hopelessness, worthlessness, or inappropriate guilt.-- Familiy pressures making her feel guilty, disappointed in self  History of chronic or recurrent depression for which the client has taken antidepressant medication, had outpatient treatment.  Lack of energy.-- frequent fatigue  Low self-esteem. Poor  concentration and indecisiveness.   Client Treatment Preferences: to continue with present therapist   Healthcare consumer's goal for treatment:  Psychologist, [name], Ph.D.,  will support the patient's ability to achieve the goals identified. Cognitive Behavioral Therapy, Dialectical Behavioral Therapy, Motivational Interviewing, parent training, and other evidenced-based practices will be used to promote progress towards healthy functioning.   Healthcare consumer Lynn Gonzales will: Actively participate in therapy, working towards healthy functioning.    *Justification for Continuation/Discontinuation of Goal: R=Revised, O=Ongoing, A=Achieved, D=Discontinued  Goal 1) Learn and apply problem-solving skills to current circumstances in order to maintain positive mood states. 5-Point Likert rating baseline date: 06/19/2021 Target Date Goal Was reviewed Status Code Progress towards goal/Likert rating  06/20/2022 06/19/2021         O 4/5 - Pt has learned and is implementing p/s skills that support positive mood states             Goal 2) Develop healthy interpersonal relationships that lead to the alleviation and help prevent the relapse of depression. 5-Point Likert rating baseline date: 06/19/2021 Target Date Goal Was reviewed Status Code 3/5 - Progress towards goal/Likert rating  06/20/2022 06/19/2021          O Pt has learned interpersonal skills that supporting positive mood states             Goal 3) Learn and apply coping skills to current circumstances in order to maintain positive mood states.  5-Point Likert rating baseline date: 06/19/2021 Target Date Goal Was reviewed Status Code Progress towards goal/Likert rating  06/20/2022 06/19/2021            O 2/5 - Pt  This plan has been reviewed and created by the following participants:  This plan will be reviewed at least every 12 months. Date Behavioral Health Clinician Date Guardian/Patient    06/19/2021 [name], Ph.D.  06/19/2021 Lynn Gonzales reports that she had a problem with her ear and went to her PCP.  Much to her surprise she discovered that she was pregnant.  We d/e/p her mixed feelings about the pregnancy and its timing.  I validated her experience from the context of her just having start her [facility] program and recent miscarriage ([facility] pregnancy).      [name], PhD

## 2021-07-23 ENCOUNTER — Ambulatory Visit (INDEPENDENT_AMBULATORY_CARE_PROVIDER_SITE_OTHER): Payer: BC Managed Care – PPO | Admitting: Psychology

## 2021-07-23 DIAGNOSIS — F330 Major depressive disorder, recurrent, mild: Secondary | ICD-10-CM

## 2021-07-23 NOTE — Progress Notes (Signed)
PROGRESS NOTE:   Name: Lynn Gonzales Date: 07/23/2021 MRN: [id number] DOB: Nov 14, 1995 PCP: Pcp, No  Start Time: 11:00 AM Stop Time: 11:55 AM   Note: Annual Review 07-24-2022   Today I met with  Lynn Gonzales in remote video (WebEx) face-to-face individual psychotherapy.     Distance Site: Client's Home Orginating Site: Dr Jannifer Franklin Remote Office Consent: Obtained verbal consent to transmit  session remotely    Diagnosis F33.41 Major Depressive Disorder, recurrent, in remission F41.1  Generalized Anxiety Disorder   Individualized Treatment Plan     Strengths: Bright, verbal, motivated and resourceful  Supports:  spouse and family   Goal/Needs for Treatment:  In order of importance to patient 1) Encourage the patient to use assertiveness skills and boundary setting application to the client's daily life.   2) Develop healthy interpersonal relationships that lead to the alleviation and help prevent the relapse of depression.  3) Balance life activities between consideration of others and development of own interests.    Client Statement of Needs: requires support, guidance and skills to manage emotional, family and work struggles as well as for life transitions.   Treatment Level: Outpatient Individual Psychotherapy  Symptoms:  Decrease or loss of appetite.-- Skipping meals due to loss of appetite, or busy schedule  Depressed or irritable mood.-- Sad affect most of the time, easily losses patience  Diminished interest in or enjoyment of activities. -- Difficulties with motivation for normal everyday activities  Feelings of hopelessness, worthlessness, or inappropriate guilt.-- Familiy pressures making her feel guilty, disappointed in self  History of chronic or recurrent depression for which the client has taken antidepressant medication, had outpatient treatment.  Lack of energy.-- frequent fatigue  Low self-esteem. Poor concentration and indecisiveness.   Client  Treatment Preferences: to continue with present therapist   Healthcare consumer's goal for treatment:  Psychologist, [name], Ph.D.,  will support the patient's ability to achieve the goals identified. Cognitive Behavioral Therapy, Dialectical Behavioral Therapy, Motivational Interviewing, parent training, and other evidenced-based practices will be used to promote progress towards healthy functioning.   Healthcare consumer Lynn Gonzales will: Actively participate in therapy, working towards healthy functioning.    *Justification for Continuation/Discontinuation of Goal: R=Revised, O=Ongoing, A=Achieved, D=Discontinued  Goal 1) Learn and apply problem-solving skills to current circumstances in order to maintain positive mood states. 5-Point Likert rating baseline date: 06/19/2021 Target Date Goal Was reviewed Status Code Progress towards goal/Likert rating  06/20/2022 06/19/2021         O 4/5 - Pt has learned and is implementing p/s skills that support positive mood states             Goal 2) Develop healthy interpersonal relationships that lead to the alleviation and help prevent the relapse of depression. 5-Point Likert rating baseline date: 06/19/2021 Target Date Goal Was reviewed Status Code 3/5 - Progress towards goal/Likert rating  06/20/2022 06/19/2021          O Pt has learned interpersonal skills that supporting positive mood states             Goal 3) Learn and apply coping skills to current circumstances in order to maintain positive mood states.  5-Point Likert rating baseline date: 06/19/2021 Target Date Goal Was reviewed Status Code Progress towards goal/Likert rating  06/20/2022 06/19/2021            O 2/5 - Pt                This plan has been  reviewed and created by the following participants:  This plan will be reviewed at least every 12 months. Date Behavioral Health Clinician Date Guardian/Patient   06/19/2021 [name], Ph.D.  06/19/2021 Lynn Gonzales reports that she went to her OB/GYN to confirm the pregnancy.  She states that she has been very emotional and feels like she is being "irrational."  We d/e/p what has been happening, validated her real concerns, identified her anxious distortions and worked through how to challenge them.  I encouraged her to take things one day at a time.     I validated her experience from the context of her just having start her [facility] program and recent miscarriage ([facility] pregnancy).      [name], PhD

## 2021-07-27 ENCOUNTER — Ambulatory Visit: Payer: PRIVATE HEALTH INSURANCE | Admitting: Psychology

## 2021-07-28 ENCOUNTER — Ambulatory Visit (INDEPENDENT_AMBULATORY_CARE_PROVIDER_SITE_OTHER): Payer: BC Managed Care – PPO | Admitting: Psychology

## 2021-07-28 DIAGNOSIS — F3341 Major depressive disorder, recurrent, in partial remission: Secondary | ICD-10-CM | POA: Diagnosis not present

## 2021-07-28 NOTE — Progress Notes (Signed)
PROGRESS NOTE:   Name: Lynn Gonzales Date: 07/28/2021 MRN: [id number] DOB: Aug 03, 1995 PCP: Pcp, No  Start Time: 9:00 AM Stop Time: 9:55 AM   Note: Annual Review 07-24-2022   Today I met with  Lynn Gonzales in remote video (WebEx) face-to-face individual psychotherapy.     Distance Site: Client's Home Orginating Site: Dr Jannifer Franklin Remote Office Consent: Obtained verbal consent to transmit  session remotely    Diagnosis F33.41 Major Depressive Disorder, recurrent, in remission F41.1  Generalized Anxiety Disorder   Individualized Treatment Plan     Strengths: Bright, verbal, motivated and resourceful  Supports:  spouse and family   Goal/Needs for Treatment:  In order of importance to patient 1) Encourage the patient to use assertiveness skills and boundary setting application to the client's daily life.   2) Develop healthy interpersonal relationships that lead to the alleviation and help prevent the relapse of depression.  3) Balance life activities between consideration of others and development of own interests.    Client Statement of Needs: requires support, guidance and skills to manage emotional, family and work struggles as well as for life transitions.   Treatment Level: Outpatient Individual Psychotherapy  Symptoms:  Decrease or loss of appetite.-- Skipping meals due to loss of appetite, or busy schedule  Depressed or irritable mood.-- Sad affect most of the time, easily losses patience  Diminished interest in or enjoyment of activities. -- Difficulties with motivation for normal everyday activities  Feelings of hopelessness, worthlessness, or inappropriate guilt.-- Familiy pressures making her feel guilty, disappointed in self  History of chronic or recurrent depression for which the client has taken antidepressant medication, had outpatient treatment.  Lack of energy.-- frequent fatigue  Low self-esteem. Poor concentration and indecisiveness.   Client  Treatment Preferences: to continue with present therapist   Healthcare consumer's goal for treatment:  Psychologist, [name], Ph.D.,  will support the patient's ability to achieve the goals identified. Cognitive Behavioral Therapy, Dialectical Behavioral Therapy, Motivational Interviewing, parent training, and other evidenced-based practices will be used to promote progress towards healthy functioning.   Healthcare consumer Lynn Gonzales will: Actively participate in therapy, working towards healthy functioning.    *Justification for Continuation/Discontinuation of Goal: R=Revised, O=Ongoing, A=Achieved, D=Discontinued  Goal 1) Learn and apply problem-solving skills to current circumstances in order to maintain positive mood states. 5-Point Likert rating baseline date: 06/19/2021 Target Date Goal Was reviewed Status Code Progress towards goal/Likert rating  06/20/2022 06/19/2021         O 4/5 - Pt has learned and is implementing p/s skills that support positive mood states             Goal 2) Develop healthy interpersonal relationships that lead to the alleviation and help prevent the relapse of depression. 5-Point Likert rating baseline date: 06/19/2021 Target Date Goal Was reviewed Status Code 3/5 - Progress towards goal/Likert rating  06/20/2022 06/19/2021          O Pt has learned interpersonal skills that supporting positive mood states             Goal 3) Learn and apply coping skills to current circumstances in order to maintain positive mood states.  5-Point Likert rating baseline date: 06/19/2021 Target Date Goal Was reviewed Status Code Progress towards goal/Likert rating  06/20/2022 06/19/2021            O 2/5 - Pt                This plan has been  reviewed and created by the following participants:  This plan will be reviewed at least every 12 months. Date Behavioral Health Clinician Date Guardian/Patient   06/19/2021 [name], Ph.D.  06/19/2021 Lynn Gonzales reports that she went to her OB/GYN to f/u on the pregnancy.  She states that her sister went with her as moral support.  Unfortunately, she neither felt like she got the answers she needed and her sister was less than supported.  We d/e/p how she was handling the "unknown" and managing the wait time needed to know whether the pregnancy is viable.  She is managing to take things one day at a time.     [name], PhD

## 2021-07-30 ENCOUNTER — Ambulatory Visit: Payer: PRIVATE HEALTH INSURANCE | Admitting: Psychology

## 2021-08-05 ENCOUNTER — Ambulatory Visit (INDEPENDENT_AMBULATORY_CARE_PROVIDER_SITE_OTHER): Payer: BC Managed Care – PPO | Admitting: Psychology

## 2021-08-05 DIAGNOSIS — F3341 Major depressive disorder, recurrent, in partial remission: Secondary | ICD-10-CM | POA: Diagnosis not present

## 2021-08-05 NOTE — Progress Notes (Signed)
PROGRESS NOTE:   Name: Lynn Gonzales Date: 08/05/2021 MRN: [id number] DOB: 08-Oct-1995 PCP: Pcp, No  Start Time: 11:00 PM Stop Time: 11:55 PM   Note: Annual Review 06-20-2022   Today I met with  [name] in remote video (WebEx) face-to-face individual psychotherapy.     Distance Site: Client's Home Orginating Site: Dr. Jannifer Franklin Remote Office Consent: Obtained verbal consent to transmit  session remotely    Diagnosis F33.41 Major Depressive Disorder, recurrent, in remission F41.1  Generalized Anxiety Disorder   Individualized Treatment Plan      Strengths: Bright, verbal, motivated and resourceful  Supports:  spouse and family   Goal/Needs for Treatment:  In order of importance to patient 1) Encourage the patient to use assertiveness skills and boundary setting application to the client's daily life.   2) Develop healthy interpersonal relationships that lead to the alleviation and help prevent the relapse of depression.  3) Balance life activities between consideration of others and development of own interests.    Client Statement of Needs: requires support, guidance and skills to manage emotional, family and work struggles as well as for life transitions.   Treatment Level: Outpatient Individual Psychotherapy  Symptoms:  Decrease or loss of appetite.-- Skipping meals due to loss of appetite, or busy schedule  Depressed or irritable mood.-- Sad affect most of the time, easily losses patience  Diminished interest in or enjoyment of activities. -- Difficulties with motivation for normal everyday activities  Feelings of hopelessness, worthlessness, or inappropriate guilt.-- Familiy pressures making her feel guilty, disappointed in self  History of chronic or recurrent depression for which the client has taken antidepressant medication, had outpatient treatment.  Lack of energy.-- frequent fatigue  Low self-esteem. Poor concentration and indecisiveness.    Client Treatment Preferences: to continue with present therapist   Healthcare consumer's goal for treatment:  Psychologist, [name], Ph.D.,  will support the patient's ability to achieve the goals identified. Cognitive Behavioral Therapy, Dialectical Behavioral Therapy, Motivational Interviewing, parent training, and other evidenced-based practices will be used to promote progress towards healthy functioning.   Healthcare consumer Charitie Hinote will: Actively participate in therapy, working towards healthy functioning.    *Justification for Continuation/Discontinuation of Goal: R=Revised, O=Ongoing, A=Achieved, D=Discontinued  Goal 1) Learn and apply problem-solving skills to current circumstances in order to maintain  positive mood states.  5-Point Likert rating baseline date: 06/19/2021 Target Date Goal Was reviewed Status Code Progress towards goal/Likert rating  06/20/2022 06/19/2021         O 4/5 - Pt has learned and is implementing p/s skills that support positive mood states             Goal 2) Develop healthy interpersonal relationships that lead to the alleviation and help prevent  the relapse of depression.  5-Point Likert rating baseline date: 06/19/2021 Target Date Goal Was reviewed Status Code 3/5 - Progress towards goal/Likert rating  06/20/2022 06/19/2021          O Pt has learned interpersonal skills that supporting positive mood states             Goal 3) Learn and apply coping skills to current circumstances in order to maintain positive mood  states.  5-Point Likert rating baseline date: 06/19/2021 Target Date Goal Was reviewed Status Code Progress towards goal/Likert rating  06/20/2022 06/19/2021            O 2/5 - Pt  This plan has been reviewed and created by the following participants:  This plan will be reviewed  at least every 12 months. Date Behavioral Health Clinician Date Guardian/Patient   06/19/2021 [name],  Ph.D.  06/19/2021 [name] reports that her work travel was a mixed experience.  She shared her experience, the challenges and the difficulties of being caught in the middle.  I was able to provide the support she needed in session.  We also d/p some situations that occurred on Father's Day and the dynamics between her and her sisters.      [name], PhD

## 2021-08-06 ENCOUNTER — Ambulatory Visit: Payer: PRIVATE HEALTH INSURANCE | Admitting: Psychology

## 2021-08-11 ENCOUNTER — Ambulatory Visit (INDEPENDENT_AMBULATORY_CARE_PROVIDER_SITE_OTHER): Payer: BC Managed Care – PPO | Admitting: Psychology

## 2021-08-11 DIAGNOSIS — F3341 Major depressive disorder, recurrent, in partial remission: Secondary | ICD-10-CM

## 2021-08-13 ENCOUNTER — Ambulatory Visit: Payer: PRIVATE HEALTH INSURANCE | Admitting: Psychology

## 2021-08-20 ENCOUNTER — Ambulatory Visit: Payer: PRIVATE HEALTH INSURANCE | Admitting: Psychology

## 2021-08-24 ENCOUNTER — Ambulatory Visit: Payer: BC Managed Care – PPO | Admitting: Psychology

## 2021-08-27 ENCOUNTER — Ambulatory Visit (INDEPENDENT_AMBULATORY_CARE_PROVIDER_SITE_OTHER): Payer: BC Managed Care – PPO | Admitting: Psychology

## 2021-08-27 DIAGNOSIS — F330 Major depressive disorder, recurrent, mild: Secondary | ICD-10-CM

## 2021-08-27 NOTE — Progress Notes (Signed)
PROGRESS NOTE:   Name: Lynn Gonzales Date: 08/27/2021 MRN: [id number] DOB: 10-May-1995 PCP: Pcp, No  Start Time: 11:00 PM Stop Time: 11:55 PM   Note: Annual Review 06-20-2022   Today I met with  Lynn Gonzales in remote video (WebEx) face-to-face individual psychotherapy.     Distance Site: Client's Home Orginating Site: Dr. Jannifer Franklin Remote Office Consent: Obtained verbal consent to transmit  session remotely    Diagnosis F33.41 Major Depressive Disorder, recurrent, in remission F41.1  Generalized Anxiety Disorder   Individualized Treatment Plan      Strengths: Bright, verbal, motivated and resourceful  Supports:  spouse and family   Goal/Needs for Treatment:  In order of importance to patient 1) Encourage the patient to use assertiveness skills and boundary setting application to the client's daily life.   2) Develop healthy interpersonal relationships that lead to the alleviation and help prevent the relapse of depression.  3) Balance life activities between consideration of others and development of own interests.    Client Statement of Needs: requires support, guidance and skills to manage emotional, family and work struggles as well as for life transitions.   Treatment Level: Outpatient Individual Psychotherapy  Symptoms:  Decrease or loss of appetite.-- Skipping meals due to loss of appetite, or busy schedule  Depressed or irritable mood.-- Sad affect most of the time, easily losses patience  Diminished interest in or enjoyment of activities. -- Difficulties with motivation for normal everyday activities  Feelings of hopelessness, worthlessness, or inappropriate guilt.-- Familiy pressures making her feel guilty, disappointed in self  History of chronic or recurrent depression for which the client has taken antidepressant medication, had outpatient treatment.  Lack of energy.-- frequent fatigue  Low self-esteem. Poor concentration and indecisiveness.    Client Treatment Preferences: to continue with present therapist   Healthcare consumer's goal for treatment:  Psychologist, [name], Ph.D.,  will support the patient's ability to achieve the goals identified. Cognitive Behavioral Therapy, Dialectical Behavioral Therapy, Motivational Interviewing, parent training, and other evidenced-based practices will be used to promote progress towards healthy functioning.   Healthcare consumer Lynn Gonzales will: Actively participate in therapy, working towards healthy functioning.    *Justification for Continuation/Discontinuation of Goal: R=Revised, O=Ongoing, A=Achieved, D=Discontinued  Goal 1) Learn and apply problem-solving skills to current circumstances in order to maintain  positive mood states.  5-Point Likert rating baseline date: 06/19/2021 Target Date Goal Was reviewed Status Code Progress towards goal/Likert rating  06/20/2022 06/19/2021         O 4/5 - Pt has learned and is implementing p/s skills that support positive mood states             Goal 2) Develop healthy interpersonal relationships that lead to the alleviation and help prevent  the relapse of depression.  5-Point Likert rating baseline date: 06/19/2021 Target Date Goal Was reviewed Status Code 3/5 - Progress towards goal/Likert rating  06/20/2022 06/19/2021          O Pt has learned interpersonal skills that supporting positive mood states             Goal 3) Learn and apply coping skills to current circumstances in order to maintain positive mood  states.  5-Point Likert rating baseline date: 06/19/2021 Target Date Goal Was reviewed Status Code Progress towards goal/Likert rating  06/20/2022 06/19/2021            O 2/5 - Pt  This plan has been reviewed and created by the following participants:  This plan will be reviewed  at least every 12 months. Date Behavioral Health Clinician Date Guardian/Patient   06/19/2021 [name],  Ph.D.  06/19/2021 Lynn Gonzales reports that she hasn't been feeling well and that it's been difficult to get motivated to do much.  She stated that her pregnancy has been difficult with heavy bleeding, smell aversions and nausea.  We d/e/p her anxiety despite being told everything is "normal" and strains on her relationship with her husband.      [name], PhD

## 2021-08-31 ENCOUNTER — Ambulatory Visit: Payer: BC Managed Care – PPO | Admitting: Psychology

## 2021-09-03 ENCOUNTER — Ambulatory Visit: Payer: BC Managed Care – PPO | Admitting: Psychology

## 2021-09-07 ENCOUNTER — Ambulatory Visit: Payer: BC Managed Care – PPO | Admitting: Psychology

## 2021-09-10 ENCOUNTER — Ambulatory Visit (INDEPENDENT_AMBULATORY_CARE_PROVIDER_SITE_OTHER): Payer: BC Managed Care – PPO | Admitting: Psychology

## 2021-09-10 DIAGNOSIS — F330 Major depressive disorder, recurrent, mild: Secondary | ICD-10-CM

## 2021-09-10 NOTE — Progress Notes (Signed)
PROGRESS NOTE:   Name: Lynn Gonzales Date: 09/10/2021 MRN: [id number] DOB: 01/23/1996 PCP: Pcp, No  Start Time: 11:00 PM Stop Time: 11:55 PM   Note: Annual Review 06-20-2022   Today I met with  Lynn Gonzales in remote video (WebEx) face-to-face individual psychotherapy.     Distance Site: Client's Home Orginating Site: Dr. Jannifer Franklin Remote Office Consent: Obtained verbal consent to transmit  session remotely    Diagnosis F33.41 Major Depressive Disorder, recurrent, in remission F41.1  Generalized Anxiety Disorder   Individualized Treatment Plan      Strengths: Bright, verbal, motivated and resourceful  Supports:  spouse and family   Goal/Needs for Treatment:  In order of importance to patient 1) Encourage the patient to use assertiveness skills and boundary setting application to the client's daily life.   2) Develop healthy interpersonal relationships that lead to the alleviation and help prevent the relapse of depression.  3) Balance life activities between consideration of others and development of own interests.    Client Statement of Needs: requires support, guidance and skills to manage emotional, family and work struggles as well as for life transitions.   Treatment Level: Outpatient Individual Psychotherapy  Symptoms:  Decrease or loss of appetite.-- Skipping meals due to loss of appetite, or busy schedule  Depressed or irritable mood.-- Sad affect most of the time, easily losses patience  Diminished interest in or enjoyment of activities. -- Difficulties with motivation for normal everyday activities  Feelings of hopelessness, worthlessness, or inappropriate guilt.-- Familiy pressures making her feel guilty, disappointed in self  History of chronic or recurrent depression for which the client has taken antidepressant medication, had outpatient treatment.  Lack of energy.-- frequent fatigue  Low self-esteem. Poor concentration and  indecisiveness.   Client Treatment Preferences: to continue with present therapist   Healthcare consumer's goal for treatment:  Psychologist, [name], Ph.D.,  will support the patient's ability to achieve the goals identified. Cognitive Behavioral Therapy, Dialectical Behavioral Therapy, Motivational Interviewing, parent training, and other evidenced-based practices will be used to promote progress towards healthy functioning.   Healthcare consumer Lynn Gonzales will: Actively participate in therapy, working towards healthy functioning.    *Justification for Continuation/Discontinuation of Goal: R=Revised, O=Ongoing, A=Achieved, D=Discontinued  Goal 1) Learn and apply problem-solving skills to current circumstances in order to maintain  positive mood states.  5-Point Likert rating baseline date: 06/19/2021 Target Date Goal Was reviewed Status Code Progress towards goal/Likert rating  06/20/2022 06/19/2021         O 4/5 - Pt has learned and is implementing p/s skills that support positive mood states             Goal 2) Develop healthy interpersonal relationships that lead to the alleviation and help prevent  the relapse of depression.  5-Point Likert rating baseline date: 06/19/2021 Target Date Goal Was reviewed Status Code 3/5 - Progress towards goal/Likert rating  06/20/2022 06/19/2021          O Pt has learned interpersonal skills that supporting positive mood states             Goal 3) Learn and apply coping skills to current circumstances in order to maintain positive mood  states.  5-Point Likert rating baseline date: 06/19/2021 Target Date Goal Was reviewed Status Code Progress towards goal/Likert rating  06/20/2022 06/19/2021            O 2/5 - Pt  This plan has been reviewed and created by the following participants:  This plan will be reviewed  at least every 12 months. Date Behavioral Health Clinician Date Guardian/Patient   06/19/2021  [name], Ph.D.  06/19/2021 Lynn Gonzales reports that she continues to not feel well and she is still experiencing bleeding.  She went in to see her OB/Gyn and asked for an exam.  They refused her because she had an ultrasound.  We d/p her anxious feelings and needing to have answers.    Lynn Gonzales states that her son Lynn Gonzales is challenging them at home more than usual.  We d/e/p what may be going on with him at day care and how it's impacting his behavior at home.  We agreed that she would speak to the day care director if she doesn't see any improvements in his behavior.   [name], PhD

## 2021-09-17 ENCOUNTER — Ambulatory Visit (INDEPENDENT_AMBULATORY_CARE_PROVIDER_SITE_OTHER): Payer: BC Managed Care – PPO | Admitting: Psychology

## 2021-09-17 DIAGNOSIS — F330 Major depressive disorder, recurrent, mild: Secondary | ICD-10-CM | POA: Diagnosis not present

## 2021-09-17 NOTE — Progress Notes (Signed)
PROGRESS NOTE:   Name: Lynn Gonzales Date: 09/17/2021 MRN: [id number] DOB: Oct 01, 1995 PCP: Pcp, No  Start Time: 11:00 PM Stop Time: 11:55 PM   Note: Annual Review 06-20-2022   Today I met with  Lynn Gonzales in remote video (WebEx) face-to-face individual psychotherapy.     Distance Site: Client's Home Orginating Site: Dr. Jannifer Franklin Remote Office Consent: Obtained verbal consent to transmit  session remotely    Diagnosis F33.41 Major Depressive Disorder, recurrent, in remission F41.1  Generalized Anxiety Disorder   Individualized Treatment Plan      Strengths: Bright, verbal, motivated and resourceful  Supports:  spouse and family   Goal/Needs for Treatment:  In order of importance to patient 1) Encourage the patient to use assertiveness skills and boundary setting application to the client's daily life.   2) Develop healthy interpersonal relationships that lead to the alleviation and help prevent the relapse of depression.  3) Balance life activities between consideration of others and development of own interests.    Client Statement of Needs: requires support, guidance and skills to manage emotional, family and work struggles as well as for life transitions.   Treatment Level: Outpatient Individual Psychotherapy  Symptoms:  Decrease or loss of appetite.-- Skipping meals due to loss of appetite, or busy schedule  Depressed or irritable mood.-- Sad affect most of the time, easily losses patience  Diminished interest in or enjoyment of activities. -- Difficulties with motivation for normal everyday activities  Feelings of hopelessness, worthlessness, or inappropriate guilt.-- Lynn pressures making her feel guilty, disappointed in self  History of chronic or recurrent depression for which the client has taken antidepressant medication, had outpatient treatment.  Lack of energy.-- frequent fatigue  Low self-esteem. Poor concentration and indecisiveness.    Client Treatment Preferences: to continue with present therapist   Healthcare consumer's goal for treatment:  Psychologist, [name], Ph.D.,  will support the patient's ability to achieve the goals identified. Cognitive Behavioral Therapy, Dialectical Behavioral Therapy, Motivational Interviewing, parent training, and other evidenced-based practices will be used to promote progress towards healthy functioning.   Healthcare consumer [name] will: Actively participate in therapy, working towards healthy functioning.    *Justification for Continuation/Discontinuation of Goal: R=Revised, O=Ongoing, A=Achieved, D=Discontinued  Goal 1) Learn and apply problem-solving skills to current circumstances in order to maintain  positive mood states.  5-Point Likert rating baseline date: 06/19/2021 Target Date Goal Was reviewed Status Code Progress towards goal/Likert rating  06/20/2022 06/19/2021         O 4/5 - Pt has learned and is implementing p/s skills that support positive mood states             Goal 2) Develop healthy interpersonal relationships that lead to the alleviation and help prevent  the relapse of depression.  5-Point Likert rating baseline date: 06/19/2021 Target Date Goal Was reviewed Status Code 3/5 - Progress towards goal/Likert rating  06/20/2022 06/19/2021          O Pt has learned interpersonal skills that supporting positive mood states             Goal 3) Learn and apply coping skills to current circumstances in order to maintain positive mood  states.  5-Point Likert rating baseline date: 06/19/2021 Target Date Goal Was reviewed Status Code Progress towards goal/Likert rating  06/20/2022 06/19/2021            O 2/5 - Pt  This plan has been reviewed and created by the following participants:  This plan will be reviewed  at least every 12 months. Date Behavioral Health Clinician Date Guardian/Patient   06/19/2021 [name],  Ph.D.  06/19/2021 Lynn Gonzales reports that she continues to struggle with depression.  She has found it hard to do very much.  I noted that things have not changed much in her life other than the pregnancy.  We d/ talking to her new OB about an antidepressant.  Lynn Gonzales has a pattern of PMDD and pregnancy hormones I fear have pushed her into a depression.   Lynn Gonzales states that she is having an issue with her mother around disciplining Lynn Gonzales.  And then when she disciplines her niece when she is in her care, she is having issues with her sister.  We d/e/p what is occurring and how to respond.  Via states that she tried to get to gether with her friend.  Her friend made things to difficult in making so many demands.  We d/p the difficulties of reconnecting with old college friends.   [name], PhD

## 2021-09-21 ENCOUNTER — Ambulatory Visit: Payer: BC Managed Care – PPO | Admitting: Psychology

## 2021-09-24 ENCOUNTER — Ambulatory Visit: Payer: BC Managed Care – PPO | Admitting: Psychology

## 2021-09-28 ENCOUNTER — Ambulatory Visit: Payer: BC Managed Care – PPO | Admitting: Psychology

## 2021-10-01 ENCOUNTER — Ambulatory Visit (INDEPENDENT_AMBULATORY_CARE_PROVIDER_SITE_OTHER): Payer: BC Managed Care – PPO | Admitting: Psychology

## 2021-10-01 DIAGNOSIS — F330 Major depressive disorder, recurrent, mild: Secondary | ICD-10-CM

## 2021-10-01 NOTE — Progress Notes (Signed)
PROGRESS NOTE:   Name: Lynn Gonzales Date: 10/01/2021 MRN: [id number] DOB: 17-Dec-1995 PCP: Pcp, No  Start Time: 11:00 AM Stop Time: 11:55 AM   Note: Annual Review 06-20-2022   Today I met with  Lynn Gonzales in remote video (WebEx) face-to-face individual psychotherapy.     Distance Site: Client's Home Orginating Site: Dr. Jannifer Franklin Remote Office Consent: Obtained verbal consent to transmit  session remotely    Diagnosis F33.41 Major Depressive Disorder, recurrent, in remission F41.1  Generalized Anxiety Disorder   Individualized Treatment Plan      Strengths: Bright, verbal, motivated and resourceful  Supports:  spouse and family   Goal/Needs for Treatment:  In order of importance to patient 1) Encourage the patient to use assertiveness skills and boundary setting application to the client's daily life.   2) Develop healthy interpersonal relationships that lead to the alleviation and help prevent the relapse of depression.  3) Balance life activities between consideration of others and development of own interests.    Client Statement of Needs: requires support, guidance and skills to manage emotional, family and work struggles as well as for life transitions.   Treatment Level: Outpatient Individual Psychotherapy  Symptoms:  Decrease or loss of appetite.-- Skipping meals due to loss of appetite, or busy schedule  Depressed or irritable mood.-- Sad affect most of the time, easily losses patience  Diminished interest in or enjoyment of activities. -- Difficulties with motivation for normal everyday activities  Feelings of hopelessness, worthlessness, or inappropriate guilt.-- Familiy pressures making her feel guilty, disappointed in self  History of chronic or recurrent depression for which the client has taken antidepressant medication, had outpatient treatment.  Lack of energy.-- frequent fatigue  Low self-esteem. Poor concentration and indecisiveness.    Client Treatment Preferences: to continue with present therapist   Healthcare consumer's goal for treatment:  Psychologist, [name], Ph.D.,  will support the patient's ability to achieve the goals identified. Cognitive Behavioral Therapy, Dialectical Behavioral Therapy, Motivational Interviewing, parent training, and other evidenced-based practices will be used to promote progress towards healthy functioning.   Healthcare consumer Lynn Gonzales will: Actively participate in therapy, working towards healthy functioning.    *Justification for Continuation/Discontinuation of Goal: R=Revised, O=Ongoing, A=Achieved, D=Discontinued  Goal 1) Learn and apply problem-solving skills to current circumstances in order to maintain  positive mood states.  5-Point Likert rating baseline date: 06/19/2021 Target Date Goal Was reviewed Status Code Progress towards goal/Likert rating  06/20/2022 06/19/2021         O 4/5 - Pt has learned and is implementing p/s skills that support positive mood states             Goal 2) Develop healthy interpersonal relationships that lead to the alleviation and help prevent  the relapse of depression.  5-Point Likert rating baseline date: 06/19/2021 Target Date Goal Was reviewed Status Code 3/5 - Progress towards goal/Likert rating  06/20/2022 06/19/2021          O Pt has learned interpersonal skills that supporting positive mood states             Goal 3) Learn and apply coping skills to current circumstances in order to maintain positive mood  states.  5-Point Likert rating baseline date: 06/19/2021 Target Date Goal Was reviewed Status Code Progress towards goal/Likert rating  06/20/2022 06/19/2021            O 2/5 - Pt  This plan has been reviewed and created by the following participants:  This plan will be reviewed  at least every 12 months. Date Behavioral Health Clinician Date Guardian/Patient   06/19/2021 [name],  Ph.D.  06/19/2021 Lynn Gonzales                    Lynn Gonzales reports that she went to [place] to help her sister move in and help watch her niece.  Her sisters were giving her a hard time about how she has been responding to them and shared some negative opinions about her marriage.  We d/e/p what was said, how she responded and how to move forward.  She was able to talk to her mother about her relationship with her sisters and her feelings about them not being able to be there for her.    Lynn Gonzales is anticipating some travel with her husband and their son.  This will be the first time her 26 year old son is flying and she has some concerns.      [name], PhD

## 2021-10-05 ENCOUNTER — Ambulatory Visit: Payer: BC Managed Care – PPO | Admitting: Psychology

## 2021-10-08 ENCOUNTER — Ambulatory Visit (INDEPENDENT_AMBULATORY_CARE_PROVIDER_SITE_OTHER): Payer: BC Managed Care – PPO | Admitting: Psychology

## 2021-10-08 DIAGNOSIS — F330 Major depressive disorder, recurrent, mild: Secondary | ICD-10-CM

## 2021-10-08 NOTE — Progress Notes (Signed)
PROGRESS NOTE:   Name: Lynn Gonzales Date: 10/08/2021 MRN: [id number] DOB: [birth year] PCP: Pcp, No  Start Time: 11:00 AM Stop Time: 11:55 AM   Note: Annual Review 06-20-2022   Today I met with  Lynn Gonzales in remote video (WebEx) face-to-face individual psychotherapy.     Distance Site: Client's Home Orginating Site: Dr. Jannifer Franklin Remote Office Consent: Obtained verbal consent to transmit  session remotely    Diagnosis F33.41 Major Depressive Disorder, recurrent, in remission F41.1  Generalized Anxiety Disorder   Individualized Treatment Plan       Strengths: Bright, verbal, motivated and resourceful  Supports:  spouse and family   Goal/Needs for Treatment:  In order of importance to patient 1) Encourage the patient to use assertiveness skills and boundary setting application to the client's daily life.   2) Develop healthy interpersonal relationships that lead to the alleviation and help prevent the relapse of depression.  3) Balance life activities between consideration of others and development of own interests.    Client Statement of Needs: requires support, guidance and skills to manage emotional, family and work struggles as well as for life transitions.   Treatment Level: Outpatient Individual Psychotherapy  Symptoms:  Decrease or loss of appetite.-- Skipping meals due to loss of appetite, or busy schedule  Depressed or irritable mood.-- Sad affect most of the time, easily losses patience  Diminished interest in or enjoyment of activities. -- Difficulties with motivation for normal everyday activities  Feelings of hopelessness, worthlessness, or inappropriate guilt.-- Familiy pressures making her feel guilty, disappointed in self  History of chronic or recurrent depression for which the client has taken antidepressant medication, had outpatient treatment.  Lack of energy.-- frequent fatigue  Low self-esteem. Poor concentration and indecisiveness.    Client Treatment Preferences: to continue with present therapist   Healthcare consumer's goal for treatment:  Psychologist, [name], Ph.D.,  will support the patient's ability to achieve the goals identified. Cognitive Behavioral Therapy, Dialectical Behavioral Therapy, Motivational Interviewing, parent training, and other evidenced-based practices will be used to promote progress towards healthy functioning.   Healthcare consumer Lynn Gonzales will: Actively participate in therapy, working towards healthy functioning.    *Justification for Continuation/Discontinuation of Goal: R=Revised, O=Ongoing, A=Achieved, D=Discontinued  Goal 1) Learn and apply problem-solving skills to current circumstances in order to maintain  positive mood states.  5-Point Likert rating baseline date: 06/19/2021 Target Date Goal Was reviewed Status Code Progress towards goal/Likert rating  06/20/2022 06/19/2021         O 4/5 - Pt has learned and is implementing p/s skills that support positive mood states             Goal 2) Develop healthy interpersonal relationships that lead to the alleviation and help prevent  the relapse of depression.  5-Point Likert rating baseline date: 06/19/2021 Target Date Goal Was reviewed Status Code 3/5 - Progress towards goal/Likert rating  06/20/2022 06/19/2021          O Pt has learned interpersonal skills that supporting positive mood states             Goal 3) Learn and apply coping skills to current circumstances in order to maintain positive mood  states.  5-Point Likert rating baseline date: 06/19/2021 Target Date Goal Was reviewed Status Code Progress towards goal/Likert rating  06/20/2022 06/19/2021            O 2/5 - Pt  This plan has been reviewed and created by the following participants:  This plan will be reviewed  at least every 12 months. Date Behavioral Health Clinician Date Guardian/Patient   06/19/2021 [name],  Ph.D.  06/19/2021 Lynn Gonzales reports that she had a difficult situation arise at work.  She talked through it and we laid out next steps.    Lynn Gonzales states that she is having a hard time talking to [name] about rearranging the furniture so that these spaces would be more accessible for him.  We talked about problems around decision making, [name] getting easily overwhelmed and alternative approaches to gain his cooperation and input.   [name], PhD

## 2021-10-15 ENCOUNTER — Ambulatory Visit: Payer: BC Managed Care – PPO | Admitting: Psychology

## 2021-10-22 ENCOUNTER — Ambulatory Visit (INDEPENDENT_AMBULATORY_CARE_PROVIDER_SITE_OTHER): Payer: BC Managed Care – PPO | Admitting: Psychology

## 2021-10-22 DIAGNOSIS — F330 Major depressive disorder, recurrent, mild: Secondary | ICD-10-CM | POA: Diagnosis not present

## 2021-10-22 NOTE — Progress Notes (Signed)
PROGRESS NOTE:   Name: Lynn Gonzales Date: 10/22/2021 MRN: [id number] DOB: 08-04-1995 PCP: Pcp, No  Start Time: 11:00 AM Stop Time: 11:55 AM   Note: Annual Review 06-20-2022   Today I met with  Lynn Gonzales in remote video (WebEx) face-to-face individual psychotherapy.     Distance Site: Client's Home Orginating Site: Dr. Jannifer Franklin Remote Office Consent: Obtained verbal consent to transmit  session remotely    Diagnosis F33.41 Major Depressive Disorder, recurrent, in remission F41.1  Generalized Anxiety Disorder   Individualized Treatment Plan       Strengths: Bright, verbal, motivated and resourceful  Supports:  spouse and family   Goal/Needs for Treatment:  In order of importance to patient 1) Encourage the patient to use assertiveness skills and boundary setting application to the client's daily life.   2) Develop healthy interpersonal relationships that lead to the alleviation and help prevent the relapse of depression.  3) Balance life activities between consideration of others and development of own interests.    Client Statement of Needs: requires support, guidance and skills to manage emotional, family and work struggles as well as for life transitions.   Treatment Level: Outpatient Individual Psychotherapy  Symptoms:  Decrease or loss of appetite.-- Skipping meals due to loss of appetite, or busy schedule  Depressed or irritable mood.-- Sad affect most of the time, easily losses patience  Diminished interest in or enjoyment of activities. -- Difficulties with motivation for normal everyday activities  Feelings of hopelessness, worthlessness, or inappropriate guilt.-- Familiy pressures making her feel guilty, disappointed in self  History of chronic or recurrent depression for which the client has taken antidepressant medication, had outpatient treatment.  Lack of energy.-- frequent fatigue  Low self-esteem. Poor concentration and indecisiveness.    Client Treatment Preferences: to continue with present therapist   Healthcare consumer's goal for treatment:  Psychologist, [name], Ph.D.,  will support the patient's ability to achieve the goals identified. Cognitive Behavioral Therapy, Dialectical Behavioral Therapy, Motivational Interviewing, parent training, and other evidenced-based practices will be used to promote progress towards healthy functioning.   Healthcare consumer Lynn Gonzales will: Actively participate in therapy, working towards healthy functioning.    *Justification for Continuation/Discontinuation of Goal: R=Revised, O=Ongoing, A=Achieved, D=Discontinued  Goal 1) Learn and apply problem-solving skills to current circumstances in order to maintain  positive mood states.  5-Point Likert rating baseline date: 06/19/2021 Target Date Goal Was reviewed Status Code Progress towards goal/Likert rating  06/20/2022 06/19/2021         O 4/5 - Pt has learned and is implementing p/s skills that support positive mood states             Goal 2) Develop healthy interpersonal relationships that lead to the alleviation and help prevent  the relapse of depression.  5-Point Likert rating baseline date: 06/19/2021 Target Date Goal Was reviewed Status Code 3/5 - Progress towards goal/Likert rating  06/20/2022 06/19/2021          O Pt has learned interpersonal skills that supporting positive mood states             Goal 3) Learn and apply coping skills to current circumstances in order to maintain positive mood  states.  5-Point Likert rating baseline date: 06/19/2021 Target Date Goal Was reviewed Status Code Progress towards goal/Likert rating  06/20/2022 06/19/2021            O 2/5 - Pt  This plan has been reviewed and created by the following participants:  This plan will be reviewed  at least every 12 months. Date Behavioral Health Clinician Date Guardian/Patient   06/19/2021 [name],  Ph.D.  06/19/2021 Lynn Gonzales                    Lynn Gonzales reports that last week work got so bad, she cried.  We d/ that she is identifying where she see problems, asks for clarity but still doesn't get much further.  We d/ her frustration, the need to continue to ask for what she needs regardless and short term actions.  Lynn Gonzales is working on trying to build her support network but is coming up against unexpected (but not surprising) obstacles.  We d/e/p how these obstacles are presenting at this time.     [name], PhD

## 2021-10-26 ENCOUNTER — Ambulatory Visit: Payer: BC Managed Care – PPO | Admitting: Psychology

## 2021-10-29 ENCOUNTER — Ambulatory Visit: Payer: PRIVATE HEALTH INSURANCE | Admitting: Psychology

## 2021-11-02 ENCOUNTER — Ambulatory Visit: Payer: BC Managed Care – PPO | Admitting: Psychology

## 2021-11-05 ENCOUNTER — Ambulatory Visit: Payer: PRIVATE HEALTH INSURANCE | Admitting: Psychology

## 2021-11-05 ENCOUNTER — Ambulatory Visit (INDEPENDENT_AMBULATORY_CARE_PROVIDER_SITE_OTHER): Payer: BC Managed Care – PPO | Admitting: Psychology

## 2021-11-05 DIAGNOSIS — F3341 Major depressive disorder, recurrent, in partial remission: Secondary | ICD-10-CM | POA: Diagnosis not present

## 2021-11-05 NOTE — Progress Notes (Signed)
PROGRESS NOTE:   Name: Lynn Gonzales Date: 11/05/2021 MRN: [id number] DOB: 1995-07-13 PCP: Pcp, No  Start Time: 11:00 AM Stop Time: 11:55 AM   Note: Annual Review 06-20-2022   Today I met with  Lynn Gonzales in remote video (WebEx) face-to-face individual psychotherapy.     Distance Site: Client's Home Orginating Site: Dr. Jannifer Franklin Remote Office Consent: Obtained verbal consent to transmit  session remotely    Diagnosis F33.41 Major Depressive Disorder, recurrent, in remission F41.1  Generalized Anxiety Disorder   Individualized Treatment Plan       Strengths: Bright, verbal, motivated and resourceful  Supports:  spouse and family   Goal/Needs for Treatment:  In order of importance to patient 1) Encourage the patient to use assertiveness skills and boundary setting application to the client's daily life.   2) Develop healthy interpersonal relationships that lead to the alleviation and help prevent the relapse of depression.  3) Balance life activities between consideration of others and development of own interests.    Client Statement of Needs: requires support, guidance and skills to manage emotional, family and work struggles as well as for life transitions.   Treatment Level: Outpatient Individual Psychotherapy  Symptoms:  Decrease or loss of appetite.-- Skipping meals due to loss of appetite, or busy schedule  Depressed or irritable mood.-- Sad affect most of the time, easily losses patience  Diminished interest in or enjoyment of activities. -- Difficulties with motivation for normal everyday activities  Feelings of hopelessness, worthlessness, or inappropriate guilt.-- Familiy pressures making her feel guilty, disappointed in self  History of chronic or recurrent depression for which the client has taken antidepressant medication, had outpatient treatment.  Lack of energy.-- frequent fatigue  Low self-esteem. Poor concentration and indecisiveness.    Client Treatment Preferences: to continue with present therapist   Healthcare consumer's goal for treatment:  Psychologist, [name], Ph.D.,  will support the patient's ability to achieve the goals identified. Cognitive Behavioral Therapy, Dialectical Behavioral Therapy, Motivational Interviewing, parent training, and other evidenced-based practices will be used to promote progress towards healthy functioning.   Healthcare consumer Lynn Gonzales will: Actively participate in therapy, working towards healthy functioning.    *Justification for Continuation/Discontinuation of Goal: R=Revised, O=Ongoing, A=Achieved, D=Discontinued  Goal 1) Learn and apply problem-solving skills to current circumstances in order to maintain  positive mood states.  5-Point Likert rating baseline date: 06/19/2021 Target Date Goal Was reviewed Status Code Progress towards goal/Likert rating  06/20/2022 06/19/2021         O 4/5 - Pt has learned and is implementing p/s skills that support positive mood states             Goal 2) Develop healthy interpersonal relationships that lead to the alleviation and help prevent  the relapse of depression.  5-Point Likert rating baseline date: 06/19/2021 Target Date Goal Was reviewed Status Code 3/5 - Progress towards goal/Likert rating  06/20/2022 06/19/2021          O Pt has learned interpersonal skills that supporting positive mood states             Goal 3) Learn and apply coping skills to current circumstances in order to maintain positive mood  states.  5-Point Likert rating baseline date: 06/19/2021 Target Date Goal Was reviewed Status Code Progress towards goal/Likert rating  06/20/2022 06/19/2021            O 2/5 - Pt  This plan has been reviewed and created by the following participants:  This plan will be reviewed  at least every 12 months. Date Behavioral Health Clinician Date Guardian/Patient   06/19/2021 [name],  Ph.D.  06/19/2021 Lynn Gonzales                    Lynn Gonzales reports that work has been VERY stressful.  We d/p what's occurred in the past two weeks, her attempts to get clear communication and dealing with their resistance to change.    Albirda also states that they had an issue with black mold in their rental which has her thinking about buying a home.  We d/e/p that she will be leading this process once again, [name]'s w/d when he is unfamiliar with things and how to bring [place] into the process successfully.  Lastly, I got Rosezetta to agree to take some time for herself out door for some short breaks from the stress.     [name], PhD

## 2021-11-12 ENCOUNTER — Ambulatory Visit (INDEPENDENT_AMBULATORY_CARE_PROVIDER_SITE_OTHER): Payer: BC Managed Care – PPO | Admitting: Psychology

## 2021-11-12 ENCOUNTER — Ambulatory Visit: Payer: PRIVATE HEALTH INSURANCE | Admitting: Psychology

## 2021-11-12 DIAGNOSIS — F3341 Major depressive disorder, recurrent, in partial remission: Secondary | ICD-10-CM

## 2021-11-12 NOTE — Progress Notes (Signed)
PROGRESS NOTE:   Name: Lynn Gonzales Date: 11/12/2021 MRN: [id number] DOB: 05-12-1995 PCP: Pcp, No  Start Time: 11:00 AM Stop Time: 11:55 AM   Note: Annual Review 06-20-2022   Today I met with  Lynn Gonzales in remote video (WebEx) face-to-face individual psychotherapy.     Distance Site: Client's Home Orginating Site: Dr. Jannifer Franklin Remote Office Consent: Obtained verbal consent to transmit  session remotely    Diagnosis F33.41 Major Depressive Disorder, recurrent, in remission F41.1  Generalized Anxiety Disorder   Individualized Treatment Plan       Strengths: Bright, verbal, motivated and resourceful  Supports:  spouse and family   Goal/Needs for Treatment:  In order of importance to patient 1) Encourage the patient to use assertiveness skills and boundary setting application to the client's daily life.   2) Develop healthy interpersonal relationships that lead to the alleviation and help prevent the relapse of depression.  3) Balance life activities between consideration of others and development of own interests.    Client Statement of Needs: requires support, guidance and skills to manage emotional, family and work struggles as well as for life transitions.   Treatment Level: Outpatient Individual Psychotherapy  Symptoms:  Decrease or loss of appetite.-- Skipping meals due to loss of appetite, or busy schedule  Depressed or irritable mood.-- Sad affect most of the time, easily losses patience  Diminished interest in or enjoyment of activities. -- Difficulties with motivation for normal everyday activities  Feelings of hopelessness, worthlessness, or inappropriate guilt.-- Familiy pressures making her feel guilty, disappointed in self  History of chronic or recurrent depression for which the client has taken antidepressant medication, had outpatient treatment.  Lack of energy.-- frequent fatigue  Low self-esteem. Poor concentration and indecisiveness.    Client Treatment Preferences: to continue with present therapist   Healthcare consumer's goal for treatment:  Psychologist, Lynn Gonzales, Ph.D.,  will support the patient's ability to achieve the goals identified. Cognitive Behavioral Therapy, Dialectical Behavioral Therapy, Motivational Interviewing, parent training, and other evidenced-based practices will be used to promote progress towards healthy functioning.   Healthcare consumer Lynn Gonzales will: Actively participate in therapy, working towards healthy functioning.    *Justification for Continuation/Discontinuation of Goal: R=Revised, O=Ongoing, A=Achieved, D=Discontinued  Goal 1) Learn and apply problem-solving skills to current circumstances in order to maintain  positive mood states.  5-Point Likert rating baseline date: 06/19/2021 Target Date Goal Was reviewed Status Code Progress towards goal/Likert rating  06/20/2022 06/19/2021         O 4/5 - Pt has learned and is implementing p/s skills that support positive mood states             Goal 2) Develop healthy interpersonal relationships that lead to the alleviation and help prevent  the relapse of depression.  5-Point Likert rating baseline date: 06/19/2021 Target Date Goal Was reviewed Status Code 3/5 - Progress towards goal/Likert rating  06/20/2022 06/19/2021          O Pt has learned interpersonal skills that supporting positive mood states             Goal 3) Learn and apply coping skills to current circumstances in order to maintain positive mood  states.  5-Point Likert rating baseline date: 06/19/2021 Target Date Goal Was reviewed Status Code Progress towards goal/Likert rating  06/20/2022 06/19/2021            O 2/5 - Pt  This plan has been reviewed and created by the following participants:  This plan will be reviewed  at least every 12 months. Date Behavioral Health Clinician Date Guardian/Patient   06/19/2021 Lynn Gonzales,  Ph.D.  06/19/2021 Lynn Gonzales reports that she had some additional issues arise at work.  We d/p the issues and steps forward.  Lynn Gonzales states that Harrington Challenger continues to struggle and is overwhelmed with many "adulting" tasks.  We d/p that [name] is tired of having to do all the heavy lifting on her own.  We also d/p a number of issues that arose when they announced they were buying a house.  I talked through ways to cope with the added stress and for the need to allow herself to be excited.    Lynn Crochet, PhD

## 2021-11-16 ENCOUNTER — Ambulatory Visit: Payer: BC Managed Care – PPO | Admitting: Psychology

## 2021-11-19 ENCOUNTER — Ambulatory Visit: Payer: PRIVATE HEALTH INSURANCE | Admitting: Psychology

## 2021-11-19 ENCOUNTER — Ambulatory Visit (INDEPENDENT_AMBULATORY_CARE_PROVIDER_SITE_OTHER): Payer: BC Managed Care – PPO | Admitting: Psychology

## 2021-11-19 DIAGNOSIS — F3341 Major depressive disorder, recurrent, in partial remission: Secondary | ICD-10-CM | POA: Diagnosis not present

## 2021-11-19 NOTE — Progress Notes (Signed)
PROGRESS NOTE:   Name: Lynn Gonzales Date: 11/19/2021 MRN: [id number] DOB: [birth year] PCP: Pcp, No  Start Time: 11:00 AM Stop Time: 11:55 AM   Note: Annual Review 06-20-2022   Today I met with  [name] in remote video (WebEx) face-to-face individual psychotherapy.     Distance Site: Client's Home Orginating Site: Dr. Jannifer Franklin Remote Office Consent: Obtained verbal consent to transmit  session remotely    Diagnosis F33.41 Major Depressive Disorder, recurrent, in remission F41.1  Generalized Anxiety Disorder   Individualized Treatment Plan       Strengths: Bright, verbal, motivated and resourceful  Supports:  spouse and family   Goal/Needs for Treatment:  In order of importance to patient 1) Encourage the patient to use assertiveness skills and boundary setting application to the client's daily life.   2) Develop healthy interpersonal relationships that lead to the alleviation and help prevent the relapse of depression.  3) Balance life activities between consideration of others and development of own interests.    Client Statement of Needs: requires support, guidance and skills to manage emotional, family and work struggles as well as for life transitions.   Treatment Level: Outpatient Individual Psychotherapy  Symptoms:  Decrease or loss of appetite.-- Skipping meals due to loss of appetite, or busy schedule  Depressed or irritable mood.-- Sad affect most of the time, easily losses patience  Diminished interest in or enjoyment of activities. -- Difficulties with motivation for normal everyday activities  Feelings of hopelessness, worthlessness, or inappropriate guilt.-- Familiy pressures making her feel guilty, disappointed in self  History of chronic or recurrent depression for which the client has taken antidepressant medication, had outpatient treatment.  Lack of energy.-- frequent fatigue  Low self-esteem. Poor concentration and indecisiveness.    Client Treatment Preferences: to continue with present therapist   Healthcare consumer's goal for treatment:  Psychologist, [name], Ph.D.,  will support the patient's ability to achieve the goals identified. Cognitive Behavioral Therapy, Dialectical Behavioral Therapy, Motivational Interviewing, parent training, and other evidenced-based practices will be used to promote progress towards healthy functioning.   Healthcare consumer Ellenor Wisniewski will: Actively participate in therapy, working towards healthy functioning.    *Justification for Continuation/Discontinuation of Goal: R=Revised, O=Ongoing, A=Achieved, D=Discontinued  Goal 1) Learn and apply problem-solving skills to current circumstances in order to maintain  positive mood states.  5-Point Likert rating baseline date: 06/19/2021 Target Date Goal Was reviewed Status Code Progress towards goal/Likert rating  06/20/2022 06/19/2021         O 4/5 - Pt has learned and is implementing p/s skills that support positive mood states             Goal 2) Develop healthy interpersonal relationships that lead to the alleviation and help prevent  the relapse of depression.  5-Point Likert rating baseline date: 06/19/2021 Target Date Goal Was reviewed Status Code 3/5 - Progress towards goal/Likert rating  06/20/2022 06/19/2021          O Pt has learned interpersonal skills that supporting positive mood states             Goal 3) Learn and apply coping skills to current circumstances in order to maintain positive mood  states.  5-Point Likert rating baseline date: 06/19/2021 Target Date Goal Was reviewed Status Code Progress towards goal/Likert rating  06/20/2022 06/19/2021            O 2/5 - Pt  This plan has been reviewed and created by the following participants:  This plan will be reviewed  at least every 12 months. Date Behavioral Health Clinician Date Guardian/Patient   06/19/2021 [name],  Ph.D.  06/19/2021 [name] reports that it was a difficult week with her husband who seemed to slide back into depressive thinking.  We d/e/p the issues that continue to arise between [place] and her husband.  I provided the support she needed and offered some suggestions for how to keep things on track.   Vickey states that her son continues to have night terrors and disrupts everyones sleep.  [name], PhD

## 2021-11-23 ENCOUNTER — Ambulatory Visit: Payer: BC Managed Care – PPO | Admitting: Psychology

## 2021-11-26 ENCOUNTER — Ambulatory Visit (INDEPENDENT_AMBULATORY_CARE_PROVIDER_SITE_OTHER): Payer: BC Managed Care – PPO | Admitting: Psychology

## 2021-11-26 ENCOUNTER — Ambulatory Visit: Payer: PRIVATE HEALTH INSURANCE | Admitting: Psychology

## 2021-11-26 DIAGNOSIS — F3341 Major depressive disorder, recurrent, in partial remission: Secondary | ICD-10-CM

## 2021-11-26 NOTE — Progress Notes (Signed)
PROGRESS NOTE:   Name: [name] Ngu Date: 11/26/2021 MRN: [id number] DOB: Oct 31, 1995 PCP: Pcp, No  Start Time: 11:00 AM Stop Time: 11:55 AM   Note: Annual Review 06-20-2022   Today I met with  [name] in remote video (WebEx) face-to-face individual psychotherapy.     Distance Site: Client's Home Orginating Site: Dr. Jannifer Franklin Remote Office Consent: Obtained verbal consent to transmit  session remotely    Diagnosis F33.41 Major Depressive Disorder, recurrent, in remission F41.1  Generalized Anxiety Disorder   Individualized Treatment Plan                Strengths: Bright, verbal, motivated and resourceful  Supports:  spouse and family   Goal/Needs for Treatment:  In order of importance to patient 1) Encourage the patient to use assertiveness skills and boundary setting application to the client's daily life.   2) Develop healthy interpersonal relationships that lead to the alleviation and help prevent the relapse of depression.  3) Balance life activities between consideration of others and development of own interests.    Client Statement of Needs: requires support, guidance and skills to manage emotional, family and work struggles as well as for life transitions.   Treatment Level: Outpatient Individual Psychotherapy  Symptoms:  Decrease or loss of appetite.-- Skipping meals due to loss of appetite, or busy schedule  Depressed or irritable mood.-- Sad affect most of the time, easily losses patience  Diminished interest in or enjoyment of activities. -- Difficulties with motivation for normal everyday activities  Feelings of hopelessness, worthlessness, or inappropriate guilt.-- Familiy pressures making her feel guilty, disappointed in self  History of chronic or recurrent depression for which the client has taken antidepressant medication, had outpatient treatment.  Lack of energy.-- frequent fatigue  Low self-esteem. Poor concentration and  indecisiveness.   Client Treatment Preferences: to continue with present therapist   Healthcare consumer's goal for treatment:  Psychologist, [name], Ph.D.,  will support the patient's ability to achieve the goals identified. Cognitive Behavioral Therapy, Dialectical Behavioral Therapy, Motivational Interviewing, parent training, and other evidenced-based practices will be used to promote progress towards healthy functioning.   Healthcare consumer Satcha Storlie will: Actively participate in therapy, working towards healthy functioning.    *Justification for Continuation/Discontinuation of Goal: R=Revised, O=Ongoing, A=Achieved, D=Discontinued  Goal 1) Learn and apply problem-solving skills to current circumstances in order to maintain  positive mood states.  5-Point Likert rating baseline date: 06/19/2021 Target Date Goal Was reviewed Status Code Progress towards goal/Likert rating  06/20/2022 06/19/2021         O 4/5 - Pt has learned and is implementing p/s skills that support positive mood states             Goal 2) Develop healthy interpersonal relationships that lead to the alleviation and help prevent  the relapse of depression.  5-Point Likert rating baseline date: 06/19/2021 Target Date Goal Was reviewed Status Code 3/5 - Progress towards goal/Likert rating  06/20/2022 06/19/2021          O Pt has learned interpersonal skills that supporting positive mood states             Goal 3) Learn and apply coping skills to current circumstances in order to maintain positive mood  states.  5-Point Likert rating baseline date: 06/19/2021 Target Date Goal Was reviewed Status Code Progress towards goal/Likert rating  06/20/2022 06/19/2021            O 2/5 - Pt  This plan has been reviewed and created by the following participants:  This plan will be reviewed  at least every 12 months. Date Behavioral Health Clinician Date Guardian/Patient   06/19/2021  [name], Ph.D.  06/19/2021 [name] reports that she and [name] had a big argument this weekend which ended with him saying (in anger) that he wanted a divorce.   Once he was through feeling angry, he said he never meant what he said but Jenesys is tired and doubtful about he willingness to change.  In session, we d/e/p what occurred leading up to the argument, her state of mind and options.   We talked about her concerns and feeling overwhelmed.  We d/ and came up with an action plan for the next week.   We talked about her support system and who to count on while she is considering her plan of action.   [name], PhD

## 2021-11-30 ENCOUNTER — Ambulatory Visit: Payer: BC Managed Care – PPO | Admitting: Psychology

## 2021-12-03 ENCOUNTER — Ambulatory Visit: Payer: PRIVATE HEALTH INSURANCE | Admitting: Psychology

## 2021-12-03 ENCOUNTER — Ambulatory Visit (INDEPENDENT_AMBULATORY_CARE_PROVIDER_SITE_OTHER): Payer: BC Managed Care – PPO | Admitting: Psychology

## 2021-12-03 DIAGNOSIS — F3341 Major depressive disorder, recurrent, in partial remission: Secondary | ICD-10-CM | POA: Diagnosis not present

## 2021-12-03 NOTE — Progress Notes (Signed)
PROGRESS NOTE:   Name: Lynn Gonzales Date: 12/03/2021 MRN: [id number] DOB: Oct 18, 1995 PCP: Pcp, No  Start Time: 11:00 AM Stop Time: 11:55 AM   Note: Annual Review 06-20-2022   Today I met with  Lynn Gonzales in remote video (WebEx) face-to-face individual psychotherapy.     Distance Site: Client's Home Orginating Site: Dr. Jannifer Franklin Remote Office Consent: Obtained verbal consent to transmit  session remotely    Diagnosis F33.41 Major Depressive Disorder, recurrent, in remission F41.1  Generalized Anxiety Disorder   Individualized Treatment Plan                Strengths: Bright, verbal, motivated and resourceful  Supports:  spouse and family   Goal/Needs for Treatment:  In order of importance to patient 1) Encourage the patient to use assertiveness skills and boundary setting application to the client's daily life.   2) Develop healthy interpersonal relationships that lead to the alleviation and help prevent the relapse of depression.  3) Balance life activities between consideration of others and development of own interests.    Client Statement of Needs: requires support, guidance and skills to manage emotional, family and work struggles as well as for life transitions.   Treatment Level: Outpatient Individual Psychotherapy  Symptoms:  Decrease or loss of appetite.-- Skipping meals due to loss of appetite, or busy schedule  Depressed or irritable mood.-- Sad affect most of the time, easily losses patience  Diminished interest in or enjoyment of activities. -- Difficulties with motivation for normal everyday activities  Feelings of hopelessness, worthlessness, or inappropriate guilt.-- Familiy pressures making her feel guilty, disappointed in self  History of chronic or recurrent depression for which the client has taken antidepressant medication, had outpatient treatment.  Lack of energy.-- frequent fatigue  Low self-esteem. Poor concentration and  indecisiveness.   Client Treatment Preferences: to continue with present therapist   Healthcare consumer's goal for treatment:  Psychologist, Lynn Gonzales, Ph.D.,  will support the patient's ability to achieve the goals identified. Cognitive Behavioral Therapy, Dialectical Behavioral Therapy, Motivational Interviewing, parent training, and other evidenced-based practices will be used to promote progress towards healthy functioning.   Healthcare consumer Lynn Gonzales will: Actively participate in therapy, working towards healthy functioning.    *Justification for Continuation/Discontinuation of Goal: R=Revised, O=Ongoing, A=Achieved, D=Discontinued  Goal 1) Learn and apply problem-solving skills to current circumstances in order to maintain  positive mood states.  5-Point Likert rating baseline date: 06/19/2021 Target Date Goal Was reviewed Status Code Progress towards goal/Likert rating  06/20/2022 06/19/2021         O 4/5 - Pt has learned and is implementing p/s skills that support positive mood states             Goal 2) Develop healthy interpersonal relationships that lead to the alleviation and help prevent  the relapse of depression.  5-Point Likert rating baseline date: 06/19/2021 Target Date Goal Was reviewed Status Code 3/5 - Progress towards goal/Likert rating  06/20/2022 06/19/2021          O Pt has learned interpersonal skills that supporting positive mood states             Goal 3) Learn and apply coping skills to current circumstances in order to maintain positive mood  states.  5-Point Likert rating baseline date: 06/19/2021 Target Date Goal Was reviewed Status Code Progress towards goal/Likert rating  06/20/2022 06/19/2021            O 2/5 - Pt  This plan has been reviewed and created by the following participants:  This plan will be reviewed  at least every 12 months. Date Behavioral Health Clinician Date Guardian/Patient   06/19/2021  Lynn Gonzales, Ph.D.  06/19/2021 Lynn Gonzales reports that Lynn Gonzales spent some time with his parents.  Since he's returned, he seems to be trying harder but Lynn Gonzales is having trouble trusting that these changes will last.  We d/e/p several incidents that occurred that have resulted in a loss of trust in Lynn Gonzales's judgement and ability to carry out responsibilities independently.  We talked about possible pathways to regain her trust over time.   Lynn Crochet, PhD

## 2021-12-07 ENCOUNTER — Ambulatory Visit: Payer: BC Managed Care – PPO | Admitting: Psychology

## 2021-12-10 ENCOUNTER — Ambulatory Visit: Payer: PRIVATE HEALTH INSURANCE | Admitting: Psychology

## 2021-12-10 ENCOUNTER — Ambulatory Visit (INDEPENDENT_AMBULATORY_CARE_PROVIDER_SITE_OTHER): Payer: BC Managed Care – PPO | Admitting: Psychology

## 2021-12-10 DIAGNOSIS — F3341 Major depressive disorder, recurrent, in partial remission: Secondary | ICD-10-CM | POA: Diagnosis not present

## 2021-12-10 NOTE — Progress Notes (Signed)
PROGRESS NOTE:   Name: Lynn Gonzales Date: 12/10/2021 MRN: [id number] DOB: [birth year] PCP: Pcp, No  Start Time: 11:00 AM Stop Time: 11:55 AM   Note: Annual Review 06-20-2022   Today I met with  Lynn Gonzales in remote video (WebEx) face-to-face individual psychotherapy.     Distance Site: Client's Home Orginating Site: Dr. Jannifer Franklin Remote Office Consent: Obtained verbal consent to transmit  session remotely    Diagnosis F33.41 Major Depressive Disorder, recurrent, in remission F41.1  Generalized Anxiety Disorder   Individualized Treatment Plan                Strengths: Bright, verbal, motivated and resourceful  Supports:  spouse and family   Goal/Needs for Treatment:  In order of importance to patient 1) Encourage the patient to use assertiveness skills and boundary setting application to the client's daily life.   2) Develop healthy interpersonal relationships that lead to the alleviation and help prevent the relapse of depression.  3) Balance life activities between consideration of others and development of own interests.    Client Statement of Needs: requires support, guidance and skills to manage emotional, family and work struggles as well as for life transitions.   Treatment Level: Outpatient Individual Psychotherapy  Symptoms:  Decrease or loss of appetite.-- Skipping meals due to loss of appetite, or busy schedule  Depressed or irritable mood.-- Sad affect most of the time, easily losses patience  Diminished interest in or enjoyment of activities. -- Difficulties with motivation for normal everyday activities  Feelings of hopelessness, worthlessness, or inappropriate guilt.-- Familiy pressures making her feel guilty, disappointed in self  History of chronic or recurrent depression for which the client has taken antidepressant medication, had outpatient treatment.  Lack of energy.-- frequent fatigue  Low self-esteem. Poor concentration and  indecisiveness.   Client Treatment Preferences: to continue with present therapist   Healthcare consumer's goal for treatment:  Psychologist, [name], Ph.D.,  will support the patient's ability to achieve the goals identified. Cognitive Behavioral Therapy, Dialectical Behavioral Therapy, Motivational Interviewing, parent training, and other evidenced-based practices will be used to promote progress towards healthy functioning.   Healthcare consumer Lynn Gonzales will: Actively participate in therapy, working towards healthy functioning.    *Justification for Continuation/Discontinuation of Goal: R=Revised, O=Ongoing, A=Achieved, D=Discontinued  Goal 1) Learn and apply problem-solving skills to current circumstances in order to maintain  positive mood states.  5-Point Likert rating baseline date: 06/19/2021 Target Date Goal Was reviewed Status Code Progress towards goal/Likert rating  06/20/2022 06/19/2021         O 4/5 - Pt has learned and is implementing p/s skills that support positive mood states             Goal 2) Develop healthy interpersonal relationships that lead to the alleviation and help prevent  the relapse of depression.  5-Point Likert rating baseline date: 06/19/2021 Target Date Goal Was reviewed Status Code 3/5 - Progress towards goal/Likert rating  06/20/2022 06/19/2021          O Pt has learned interpersonal skills that supporting positive mood states             Goal 3) Learn and apply coping skills to current circumstances in order to maintain positive mood  states.  5-Point Likert rating baseline date: 06/19/2021 Target Date Goal Was reviewed Status Code Progress towards goal/Likert rating  06/20/2022 06/19/2021            O 2/5 - Pt  This plan has been reviewed and created by the following participants:  This plan will be reviewed  at least every 12 months. Date Behavioral Health Clinician Date Guardian/Patient   06/19/2021  [name], Ph.D.  06/19/2021 Lynn Gonzales reports that Lynn Gonzales invited him parents to visit on the day of the closing.  She was upset because it was going to be stressful to deal with two more people that need to be entertained.  We d/e/p the issues, how to respond and offering a compromise they could both live with.  She shared other family issues she is having with her family that are surfacing around the purchase of her new home.  I supported and encouraged her setting limits with her sister.  We also d/e the dynamics in play with her mother, accepting where her mother is at given her anxieties and how to graciously move forward.   [name], PhD

## 2021-12-17 ENCOUNTER — Ambulatory Visit (INDEPENDENT_AMBULATORY_CARE_PROVIDER_SITE_OTHER): Payer: BC Managed Care – PPO | Admitting: Psychology

## 2021-12-17 ENCOUNTER — Ambulatory Visit: Payer: PRIVATE HEALTH INSURANCE | Admitting: Psychology

## 2021-12-17 DIAGNOSIS — F3341 Major depressive disorder, recurrent, in partial remission: Secondary | ICD-10-CM | POA: Diagnosis not present

## 2021-12-17 NOTE — Progress Notes (Signed)
PROGRESS NOTE:   Name: Lynn Gonzales Date: 12/17/2021 MRN: [id number] DOB: 1995/09/08 PCP: Pcp, No  Start Time: 11:00 AM Stop Time: 11:55 AM   Note: Annual Review 06-20-2022   Today I met with  Lynn Gonzales in remote video (WebEx) face-to-face individual psychotherapy.     Distance Site: Client's Home Orginating Site: Dr. Jannifer Franklin Remote Office Consent: Obtained verbal consent to transmit  session remotely    Diagnosis F33.41 Major Depressive Disorder, recurrent, in remission F41.1  Generalized Anxiety Disorder   Individualized Treatment Plan                Strengths: Bright, verbal, motivated and resourceful  Supports:  spouse and family   Goal/Needs for Treatment:  In order of importance to patient 1) Encourage the patient to use assertiveness skills and boundary setting application to the client's daily life.   2) Develop healthy interpersonal relationships that lead to the alleviation and help prevent the relapse of depression.  3) Balance life activities between consideration of others and development of own interests.    Client Statement of Needs: requires support, guidance and skills to manage emotional, family and work struggles as well as for life transitions.   Treatment Level: Outpatient Individual Psychotherapy  Symptoms:  Decrease or loss of appetite.-- Skipping meals due to loss of appetite, or busy schedule  Depressed or irritable mood.-- Sad affect most of the time, easily losses patience  Diminished interest in or enjoyment of activities. -- Difficulties with motivation for normal everyday activities  Feelings of hopelessness, worthlessness, or inappropriate guilt.-- Familiy pressures making her feel guilty, disappointed in self  History of chronic or recurrent depression for which the client has taken antidepressant medication, had outpatient treatment.  Lack of energy.-- frequent fatigue  Low self-esteem. Poor concentration and  indecisiveness.   Client Treatment Preferences: to continue with present therapist   Healthcare consumer's goal for treatment:  Psychologist, [name], Ph.D.,  will support the patient's ability to achieve the goals identified. Cognitive Behavioral Therapy, Dialectical Behavioral Therapy, Motivational Interviewing, parent training, and other evidenced-based practices will be used to promote progress towards healthy functioning.   Healthcare consumer Lynn Gonzales will: Actively participate in therapy, working towards healthy functioning.    *Justification for Continuation/Discontinuation of Goal: R=Revised, O=Ongoing, A=Achieved, D=Discontinued  Goal 1) Learn and apply problem-solving skills to current circumstances in order to maintain  positive mood states.  5-Point Likert rating baseline date: 06/19/2021 Target Date Goal Was reviewed Status Code Progress towards goal/Likert rating  06/20/2022 06/19/2021         O 4/5 - Pt has learned and is implementing p/s skills that support positive mood states             Goal 2) Develop healthy interpersonal relationships that lead to the alleviation and help prevent  the relapse of depression.  5-Point Likert rating baseline date: 06/19/2021 Target Date Goal Was reviewed Status Code 3/5 - Progress towards goal/Likert rating  06/20/2022 06/19/2021          O Pt has learned interpersonal skills that supporting positive mood states             Goal 3) Learn and apply coping skills to current circumstances in order to maintain positive mood  states.  5-Point Likert rating baseline date: 06/19/2021 Target Date Goal Was reviewed Status Code Progress towards goal/Likert rating  06/20/2022 06/19/2021            O 2/5 - Pt  This plan has been reviewed and created by the following participants:  This plan will be reviewed  at least every 12 months. Date Behavioral Health Clinician Date Guardian/Patient   06/19/2021  [name], Ph.D.  06/19/2021 Lynn Gonzales reports that she is surviving the move into the new house.  It was however very stressful because several times she attempted to prevent issues but they occurred anyway.  We d/e/p what occurred, what she thought/felt and how she plans to move forward with these issues.  The most difficult of these problems revolve around her husband, his under-performing and her needed to "take charge."  We d/p that this dynamic is not good for their marriage and ways to change the script and get Lynn Gonzales to become more independent.   [name], PhD

## 2021-12-21 ENCOUNTER — Ambulatory Visit: Payer: BC Managed Care – PPO | Admitting: Psychology

## 2021-12-24 ENCOUNTER — Ambulatory Visit: Payer: PRIVATE HEALTH INSURANCE | Admitting: Psychology

## 2021-12-24 ENCOUNTER — Ambulatory Visit (INDEPENDENT_AMBULATORY_CARE_PROVIDER_SITE_OTHER): Payer: BC Managed Care – PPO | Admitting: Psychology

## 2021-12-24 DIAGNOSIS — F3341 Major depressive disorder, recurrent, in partial remission: Secondary | ICD-10-CM

## 2021-12-24 NOTE — Progress Notes (Signed)
PROGRESS NOTE:   Name: Lynn Gonzales Date: 12/24/2021 MRN: [id number] DOB: [birth year] PCP: Pcp, No  Start Time: 11:00 AM Stop Time: 11:55 AM   Note: Annual Review 06-20-2022   Today I met with  Lynn Gonzales in remote video (WebEx) face-to-face individual psychotherapy.     Distance Site: Client's Home Orginating Site: Dr. Jannifer Franklin Remote Office Consent: Obtained verbal consent to transmit  session remotely    Diagnosis F33.41 Major Depressive Disorder, recurrent, in remission F41.1  Generalized Anxiety Disorder   Individualized Treatment Plan                Strengths: Bright, verbal, motivated and resourceful  Supports:  spouse and family   Goal/Needs for Treatment:  In order of importance to patient 1) Encourage the patient to use assertiveness skills and boundary setting application to the client's daily life.   2) Develop healthy interpersonal relationships that lead to the alleviation and help prevent the relapse of depression.  3) Balance life activities between consideration of others and development of own interests.    Client Statement of Needs: requires support, guidance and skills to manage emotional, family and work struggles as well as for life transitions.   Treatment Level: Outpatient Individual Psychotherapy  Symptoms:  Decrease or loss of appetite.-- Skipping meals due to loss of appetite, or busy schedule  Depressed or irritable mood.-- Sad affect most of the time, easily losses patience  Diminished interest in or enjoyment of activities. -- Difficulties with motivation for normal everyday activities  Feelings of hopelessness, worthlessness, or inappropriate guilt.-- Familiy pressures making her feel guilty, disappointed in self  History of chronic or recurrent depression for which the client has taken antidepressant medication, had outpatient treatment.  Lack of energy.-- frequent fatigue  Low self-esteem. Poor concentration and  indecisiveness.   Client Treatment Preferences: to continue with present therapist   Healthcare consumer's goal for treatment:  Psychologist, Lynn Gonzales, Ph.D.,  will support the patient's ability to achieve the goals identified. Cognitive Behavioral Therapy, Dialectical Behavioral Therapy, Motivational Interviewing, parent training, and other evidenced-based practices will be used to promote progress towards healthy functioning.   Healthcare consumer Lynn Gonzales will: Actively participate in therapy, working towards healthy functioning.    *Justification for Continuation/Discontinuation of Goal: R=Revised, O=Ongoing, A=Achieved, D=Discontinued  Goal 1) Learn and apply problem-solving skills to current circumstances in order to maintain  positive mood states.  5-Point Likert rating baseline date: 06/19/2021 Target Date Goal Was reviewed Status Code Progress towards goal/Likert rating  06/20/2022 06/19/2021         O 4/5 - Pt has learned and is implementing p/s skills that support positive mood states             Goal 2) Develop healthy interpersonal relationships that lead to the alleviation and help prevent  the relapse of depression.  5-Point Likert rating baseline date: 06/19/2021 Target Date Goal Was reviewed Status Code 3/5 - Progress towards goal/Likert rating  06/20/2022 06/19/2021          O Pt has learned interpersonal skills that supporting positive mood states             Goal 3) Learn and apply coping skills to current circumstances in order to maintain positive mood  states.  5-Point Likert rating baseline date: 06/19/2021 Target Date Goal Was reviewed Status Code Progress towards goal/Likert rating  06/20/2022 06/19/2021            O 2/5 - Pt  This plan has been reviewed and created by the following participants:  This plan will be reviewed  at least every 12 months. Date Behavioral Health Clinician Date Guardian/Patient   06/19/2021  Lynn Gonzales, Ph.D.  06/19/2021 Lynn Gonzales reports that she and her husband are having a number of difficulties which we d/e/p.  I provided the support and validation she was seeking.  I noted where she might release the struggle and let [name] try/fail on his own terms.  She can provide the resources he needs and then attempt to figure some things out on his own.  We agreed that this would be difficult, but so is the dynamic that is currently in play. In an effort to motivate Danyell to lower her stress levels, I encouraged her to prioritize managing her stress for her benefit and for the daughter she is expecting.     Lynn Crochet, PhD

## 2021-12-28 ENCOUNTER — Ambulatory Visit: Payer: BC Managed Care – PPO | Admitting: Psychology

## 2021-12-31 ENCOUNTER — Ambulatory Visit (INDEPENDENT_AMBULATORY_CARE_PROVIDER_SITE_OTHER): Payer: BC Managed Care – PPO | Admitting: Psychology

## 2021-12-31 ENCOUNTER — Ambulatory Visit: Payer: PRIVATE HEALTH INSURANCE | Admitting: Psychology

## 2021-12-31 DIAGNOSIS — F411 Generalized anxiety disorder: Secondary | ICD-10-CM | POA: Diagnosis not present

## 2021-12-31 DIAGNOSIS — F330 Major depressive disorder, recurrent, mild: Secondary | ICD-10-CM | POA: Diagnosis not present

## 2021-12-31 NOTE — Progress Notes (Signed)
PROGRESS NOTE:   Name: Lynn Gonzales Date: 12/31/2021 MRN: [id number] DOB: 10-27-1995 PCP: Pcp, No  Start Time: 11:00 AM Stop Time: 11:55 AM   Note: Annual Review 06-20-2022   Today I met with  Lynn Gonzales in remote video (WebEx) face-to-face individual psychotherapy.     Distance Site: Client's Home Orginating Site: Dr. Jannifer Franklin Remote Office Consent: Obtained verbal consent to transmit  session remotely    Diagnosis F33.41 Major Depressive Disorder, recurrent, in remission F41.1  Generalized Anxiety Disorder   Individualized Treatment Plan                Strengths: Bright, verbal, motivated and resourceful  Supports:  spouse and family   Goal/Needs for Treatment:  In order of importance to patient 1) Encourage the patient to use assertiveness skills and boundary setting application to the client's daily life.   2) Develop healthy interpersonal relationships that lead to the alleviation and help prevent the relapse of depression.  3) Balance life activities between consideration of others and development of own interests.    Client Statement of Needs: requires support, guidance and skills to manage emotional, family and work struggles as well as for life transitions.   Treatment Level: Outpatient Individual Psychotherapy  Symptoms:  Decrease or loss of appetite.-- Skipping meals due to loss of appetite, or busy schedule  Depressed or irritable mood.-- Sad affect most of the time, easily losses patience  Diminished interest in or enjoyment of activities. -- Difficulties with motivation for normal everyday activities  Feelings of hopelessness, worthlessness, or inappropriate guilt.-- Familiy pressures making her feel guilty, disappointed in self  History of chronic or recurrent depression for which the client has taken antidepressant medication, had outpatient treatment.  Lack of energy.-- frequent fatigue  Low self-esteem. Poor concentration and  indecisiveness.   Client Treatment Preferences: to continue with present therapist   Healthcare consumer's goal for treatment:  Psychologist, [name], Ph.D.,  will support the patient's ability to achieve the goals identified. Cognitive Behavioral Therapy, Dialectical Behavioral Therapy, Motivational Interviewing, parent training, and other evidenced-based practices will be used to promote progress towards healthy functioning.   Healthcare consumer Lynn Gonzales will: Actively participate in therapy, working towards healthy functioning.    *Justification for Continuation/Discontinuation of Goal: R=Revised, O=Ongoing, A=Achieved, D=Discontinued  Goal 1) Learn and apply problem-solving skills to current circumstances in order to maintain  positive mood states.  5-Point Likert rating baseline date: 06/19/2021 Target Date Goal Was reviewed Status Code Progress towards goal/Likert rating  06/20/2022 06/19/2021         O 4/5 - Pt has learned and is implementing p/s skills that support positive mood states             Goal 2) Develop healthy interpersonal relationships that lead to the alleviation and help prevent  the relapse of depression.  5-Point Likert rating baseline date: 06/19/2021 Target Date Goal Was reviewed Status Code 3/5 - Progress towards goal/Likert rating  06/20/2022 06/19/2021          O Pt has learned interpersonal skills that supporting positive mood states             Goal 3) Learn and apply coping skills to current circumstances in order to maintain positive mood  states.  5-Point Likert rating baseline date: 06/19/2021 Target Date Goal Was reviewed Status Code Progress towards goal/Likert rating  06/20/2022 06/19/2021            O 2/5 - Pt  This plan has been reviewed and created by the following participants:  This plan will be reviewed  at least every 12 months. Date Behavioral Health Clinician Date Guardian/Patient   06/19/2021  [name], Ph.D.  06/19/2021 Lynn Gonzales reports that she got called "aggressive" in her communication.  We d/e/p what is occurring within her team and how to address the problems she is having.    Lynn Gonzales states that her mood has changed and her mood is lower than where it's been.  She states that she is cramping and began  lactating.  She says this occurred when she was expecting Lynn Gonzales.  Lynn Gonzales has an appointment with her OB next week.  I talked to her about her stress load and encouraged her to use the bilateral stimulatin track on the Calm app at least twice a day to help calm her nervous system.  She agreed to give it a try.   [name], PhD

## 2022-01-04 ENCOUNTER — Ambulatory Visit: Payer: BC Managed Care – PPO | Admitting: Psychology

## 2022-01-14 ENCOUNTER — Ambulatory Visit (INDEPENDENT_AMBULATORY_CARE_PROVIDER_SITE_OTHER): Payer: BC Managed Care – PPO | Admitting: Psychology

## 2022-01-14 ENCOUNTER — Ambulatory Visit: Payer: PRIVATE HEALTH INSURANCE | Admitting: Psychology

## 2022-01-14 DIAGNOSIS — F330 Major depressive disorder, recurrent, mild: Secondary | ICD-10-CM | POA: Diagnosis not present

## 2022-01-14 NOTE — Progress Notes (Signed)
PROGRESS NOTE:   Name: Lynn Gonzales Date: 01/14/2022 MRN: [id number] DOB: Aug 13, 1995 PCP: Pcp, No  Start Time: 11:00 AM Stop Time: 11:55 AM   Note: Annual Review 06-20-2022   Today I met with  Lynn Gonzales in remote video (WebEx) face-to-face individual psychotherapy.     Distance Site: Client's Home Orginating Site: Dr. Jannifer Franklin Remote Office Consent: Obtained verbal consent to transmit  session remotely    Diagnosis F33.41 Major Depressive Disorder, recurrent, in remission F41.1  Generalized Anxiety Disorder   Individualized Treatment Plan                Strengths: Bright, verbal, motivated and resourceful  Supports:  spouse and family   Goal/Needs for Treatment:  In order of importance to patient 1) Encourage the patient to use assertiveness skills and boundary setting application to the client's daily life.   2) Develop healthy interpersonal relationships that lead to the alleviation and help prevent the relapse of depression.  3) Balance life activities between consideration of others and development of own interests.    Client Statement of Needs: requires support, guidance and skills to manage emotional, family and work struggles as well as for life transitions.   Treatment Level: Outpatient Individual Psychotherapy  Symptoms:  Decrease or loss of appetite.-- Skipping meals due to loss of appetite, or busy schedule  Depressed or irritable mood.-- Sad affect most of the time, easily losses patience  Diminished interest in or enjoyment of activities. -- Difficulties with motivation for normal everyday activities  Feelings of hopelessness, worthlessness, or inappropriate guilt.-- Familiy pressures making her feel guilty, disappointed in self  History of chronic or recurrent depression for which the client has taken antidepressant medication, had outpatient treatment.  Lack of energy.-- frequent fatigue  Low self-esteem. Poor concentration and  indecisiveness.   Client Treatment Preferences: to continue with present therapist   Healthcare consumer's goal for treatment:  Psychologist, [name], Ph.D.,  will support the patient's ability to achieve the goals identified. Cognitive Behavioral Therapy, Dialectical Behavioral Therapy, Motivational Interviewing, parent training, and other evidenced-based practices will be used to promote progress towards healthy functioning.   Healthcare consumer Therma Lasure will: Actively participate in therapy, working towards healthy functioning.    *Justification for Continuation/Discontinuation of Goal: R=Revised, O=Ongoing, A=Achieved, D=Discontinued  Goal 1) Learn and apply problem-solving skills to current circumstances in order to maintain  positive mood states.  5-Point Likert rating baseline date: 06/19/2021 Target Date Goal Was reviewed Status Code Progress towards goal/Likert rating  06/20/2022 06/19/2021         O 4/5 - Pt has learned and is implementing p/s skills that support positive mood states             Goal 2) Develop healthy interpersonal relationships that lead to the alleviation and help prevent  the relapse of depression.  5-Point Likert rating baseline date: 06/19/2021 Target Date Goal Was reviewed Status Code 3/5 - Progress towards goal/Likert rating  06/20/2022 06/19/2021          O Pt has learned interpersonal skills that supporting positive mood states             Goal 3) Learn and apply coping skills to current circumstances in order to maintain positive mood  states.  5-Point Likert rating baseline date: 06/19/2021 Target Date Goal Was reviewed Status Code Progress towards goal/Likert rating  06/20/2022 06/19/2021            O 2/5 - Pt  This plan has been reviewed and created by the following participants:  This plan will be reviewed  at least every 12 months. Date Behavioral Health Clinician Date Guardian/Patient   06/19/2021  [name], Ph.D.  06/19/2021 Lynn Gonzales                    Lynn Gonzales reports that the holiday was strange and full of family drama.  She went through each of the situations that arose with each of her sisters.  We d/ how she was impacted, how she responded and attempted to cope with her sisters acting out.  Verneta also stated that she went into the doctor's office because she was having pains which turned out to be contrations.  We d/ the need for her to slow down with unpacking, to allow others to help more and to set limits on her sisters right now in order for her to rest.   [name], PhD

## 2022-01-18 ENCOUNTER — Ambulatory Visit: Payer: BC Managed Care – PPO | Admitting: Psychology

## 2022-01-21 ENCOUNTER — Ambulatory Visit: Payer: PRIVATE HEALTH INSURANCE | Admitting: Psychology

## 2022-01-21 ENCOUNTER — Ambulatory Visit (INDEPENDENT_AMBULATORY_CARE_PROVIDER_SITE_OTHER): Payer: BC Managed Care – PPO | Admitting: Psychology

## 2022-01-21 DIAGNOSIS — F330 Major depressive disorder, recurrent, mild: Secondary | ICD-10-CM

## 2022-01-21 NOTE — Progress Notes (Signed)
PROGRESS NOTE:   Name: Lynn Gonzales Date: 01/21/2022 MRN: [id number] DOB: 15-Dec-1995 PCP: Pcp, No  Start Time: 11:00 AM Stop Time: 11:55 AM   Note: Annual Review 06-20-2022   Today I met with  Lynn Gonzales in remote video (WebEx) face-to-face individual psychotherapy.     Distance Site: Client's Home Orginating Site: Dr. Jannifer Franklin Remote Office Consent: Obtained verbal consent to transmit  session remotely    Diagnosis F33.41 Major Depressive Disorder, recurrent, in remission F41.1  Generalized Anxiety Disorder   Individualized Treatment Plan                Strengths: Bright, verbal, motivated and resourceful  Supports:  spouse and family   Goal/Needs for Treatment:  In order of importance to patient 1) Encourage the patient to use assertiveness skills and boundary setting application to the client's daily life.   2) Develop healthy interpersonal relationships that lead to the alleviation and help prevent the relapse of depression.  3) Balance life activities between consideration of others and development of own interests.    Client Statement of Needs: requires support, guidance and skills to manage emotional, family and work struggles as well as for life transitions.   Treatment Level: Outpatient Individual Psychotherapy  Symptoms:  Decrease or loss of appetite.-- Skipping meals due to loss of appetite, or busy schedule  Depressed or irritable mood.-- Sad affect most of the time, easily losses patience  Diminished interest in or enjoyment of activities. -- Difficulties with motivation for normal everyday activities  Feelings of hopelessness, worthlessness, or inappropriate guilt.-- Familiy pressures making her feel guilty, disappointed in self  History of chronic or recurrent depression for which the client has taken antidepressant medication, had outpatient treatment.  Lack of energy.-- frequent fatigue  Low self-esteem. Poor concentration and  indecisiveness.   Client Treatment Preferences: to continue with present therapist   Healthcare consumer's goal for treatment:  Psychologist, [name], Ph.D.,  will support the patient's ability to achieve the goals identified. Cognitive Behavioral Therapy, Dialectical Behavioral Therapy, Motivational Interviewing, parent training, and other evidenced-based practices will be used to promote progress towards healthy functioning.   Healthcare consumer Lynn Gonzales will: Actively participate in therapy, working towards healthy functioning.    *Justification for Continuation/Discontinuation of Goal: R=Revised, O=Ongoing, A=Achieved, D=Discontinued  Goal 1) Learn and apply problem-solving skills to current circumstances in order to maintain  positive mood states.  5-Point Likert rating baseline date: 06/19/2021 Target Date Goal Was reviewed Status Code Progress towards goal/Likert rating  06/20/2022 06/19/2021         O 4/5 - Pt has learned and is implementing p/s skills that support positive mood states             Goal 2) Develop healthy interpersonal relationships that lead to the alleviation and help prevent  the relapse of depression.  5-Point Likert rating baseline date: 06/19/2021 Target Date Goal Was reviewed Status Code 3/5 - Progress towards goal/Likert rating  06/20/2022 06/19/2021          O Pt has learned interpersonal skills that supporting positive mood states             Goal 3) Learn and apply coping skills to current circumstances in order to maintain positive mood  states.  5-Point Likert rating baseline date: 06/19/2021 Target Date Goal Was reviewed Status Code Progress towards goal/Likert rating  06/20/2022 06/19/2021            O 2/5 - Pt  This plan has been reviewed and created by the following participants:  This plan will be reviewed  at least every 12 months. Date Behavioral Health Clinician Date Guardian/Patient   06/19/2021  [name], Ph.D.  06/19/2021 Lynn Gonzales reports that she woke up feeling guilty that she was allowing her family stress effect the baby.  She felt more determined to create some separation from her sisters right now.  We d/e/p how she felt about her sisters, their current dynamics and how to take care of herself.    Lynn Gonzales also shared a difficult situation at work related to her maternity leave.  We d/e/p alternative options and doing what she needed to do for herself and the baby irregardless of her supervisor.     [name], PhD

## 2022-01-25 ENCOUNTER — Ambulatory Visit: Payer: BC Managed Care – PPO | Admitting: Psychology

## 2022-01-28 ENCOUNTER — Ambulatory Visit: Payer: PRIVATE HEALTH INSURANCE | Admitting: Psychology

## 2022-01-28 ENCOUNTER — Ambulatory Visit (INDEPENDENT_AMBULATORY_CARE_PROVIDER_SITE_OTHER): Payer: BC Managed Care – PPO | Admitting: Psychology

## 2022-01-28 DIAGNOSIS — F330 Major depressive disorder, recurrent, mild: Secondary | ICD-10-CM | POA: Diagnosis not present

## 2022-01-28 NOTE — Progress Notes (Signed)
PROGRESS NOTE:   Name: Lynn Gonzales Date: 01/28/2022 MRN: [id number] DOB: December 19, 1995 PCP: Pcp, No  Start Time: 11:00 AM Stop Time: 11:55 AM   Note: Annual Review 06-20-2022   Today I met with  [name] in remote video (WebEx) face-to-face individual psychotherapy.     Distance Site: Client's Home Orginating Site: Dr. Jannifer Franklin Remote Office Consent: Obtained verbal consent to transmit  session remotely    Diagnosis F33.41 Major Depressive Disorder, recurrent, in remission F41.1  Generalized Anxiety Disorder   Individualized Treatment Plan                Strengths: Bright, verbal, motivated and resourceful  Supports:  spouse and family   Goal/Needs for Treatment:  In order of importance to patient 1) Encourage the patient to use assertiveness skills and boundary setting application to the client's daily life.   2) Develop healthy interpersonal relationships that lead to the alleviation and help prevent the relapse of depression.  3) Balance life activities between consideration of others and development of own interests.    Client Statement of Needs: requires support, guidance and skills to manage emotional, family and work struggles as well as for life transitions.   Treatment Level: Outpatient Individual Psychotherapy  Symptoms:  Decrease or loss of appetite.-- Skipping meals due to loss of appetite, or busy schedule  Depressed or irritable mood.-- Sad affect most of the time, easily losses patience  Diminished interest in or enjoyment of activities. -- Difficulties with motivation for normal everyday activities  Feelings of hopelessness, worthlessness, or inappropriate guilt.-- Familiy pressures making her feel guilty, disappointed in self  History of chronic or recurrent depression for which the client has taken antidepressant medication, had outpatient treatment.  Lack of energy.-- frequent fatigue  Low self-esteem. Poor concentration and  indecisiveness.   Client Treatment Preferences: to continue with present therapist   Healthcare consumer's goal for treatment:  Psychologist, [name], Ph.D.,  will support the patient's ability to achieve the goals identified. Cognitive Behavioral Therapy, Dialectical Behavioral Therapy, Motivational Interviewing, parent training, and other evidenced-based practices will be used to promote progress towards healthy functioning.   Healthcare consumer Fabiha Rougeau will: Actively participate in therapy, working towards healthy functioning.    *Justification for Continuation/Discontinuation of Goal: R=Revised, O=Ongoing, A=Achieved, D=Discontinued  Goal 1) Learn and apply problem-solving skills to current circumstances in order to maintain  positive mood states.  5-Point Likert rating baseline date: 06/19/2021 Target Date Goal Was reviewed Status Code Progress towards goal/Likert rating  06/20/2022 06/19/2021         O 4/5 - Pt has learned and is implementing p/s skills that support positive mood states             Goal 2) Develop healthy interpersonal relationships that lead to the alleviation and help prevent  the relapse of depression.  5-Point Likert rating baseline date: 06/19/2021 Target Date Goal Was reviewed Status Code 3/5 - Progress towards goal/Likert rating  06/20/2022 06/19/2021          O Pt has learned interpersonal skills that supporting positive mood states             Goal 3) Learn and apply coping skills to current circumstances in order to maintain positive mood  states.  5-Point Likert rating baseline date: 06/19/2021 Target Date Goal Was reviewed Status Code Progress towards goal/Likert rating  06/20/2022 06/19/2021            O 2/5 - Pt  This plan has been reviewed and created by the following participants:  This plan will be reviewed  at least every 12 months. Date Behavioral Health Clinician Date Guardian/Patient   06/19/2021  Mary Ann Garcia, Ph.D.  06/19/2021 Reese Stumpe                    Annamaria reports that she is trying to "not care" anymore.  She shared that she spoke to HR about accommodations when she returned to work after the babies arrival.  She is frustrated with them but then they mentioned the possibility of Bonding Leave which would allow her to use PTO and get paid her full pay vs short term disability with a fraction of her pay.  We d/e/p her experience.    Addyson states that she has begun to anticipate the baby's arrival in terms of preparing Amsi for a little sister.  We d/e/p ways she is preparing and I offered some additional ideas.  In addition, we d/p the losses associated in changing her relationship with Amsi as an only child t being the older brother, and about the miscarriage that occurred around this time last year.  I t was helpful to recognize these losses and to normalize her experience as a means of provided support.   Mary Ann Garcia, PhD      

## 2022-02-01 ENCOUNTER — Ambulatory Visit: Payer: BC Managed Care – PPO | Admitting: Psychology

## 2022-02-04 ENCOUNTER — Ambulatory Visit: Payer: BC Managed Care – PPO | Admitting: Psychology

## 2022-02-04 ENCOUNTER — Ambulatory Visit: Payer: PRIVATE HEALTH INSURANCE | Admitting: Psychology

## 2022-02-11 ENCOUNTER — Ambulatory Visit: Payer: BC Managed Care – PPO | Admitting: Psychology

## 2022-02-11 ENCOUNTER — Ambulatory Visit: Payer: PRIVATE HEALTH INSURANCE | Admitting: Psychology

## 2022-02-18 ENCOUNTER — Ambulatory Visit (INDEPENDENT_AMBULATORY_CARE_PROVIDER_SITE_OTHER): Payer: BC Managed Care – PPO | Admitting: Psychology

## 2022-02-18 DIAGNOSIS — F411 Generalized anxiety disorder: Secondary | ICD-10-CM | POA: Diagnosis not present

## 2022-02-18 NOTE — Progress Notes (Signed)
PROGRESS NOTE:   Name: Lynn Gonzales Date: 02/18/2022 MRN: [id number] DOB: [birth year] PCP: Pcp, No  Start Time: 11:00 AM Stop Time: 11:55 AM   Note: Annual Review 06-20-2022   Today I met with  Lynn Gonzales in remote video (WebEx) face-to-face individual psychotherapy.     Distance Site: Client's Home Orginating Site: Dr. Jannifer Franklin Remote Office Consent: Obtained verbal consent to transmit  session remotely    Diagnosis F33.41 Major Depressive Disorder, recurrent, in remission F41.1  Generalized Anxiety Disorder   Individualized Treatment Plan                Strengths: Bright, verbal, motivated and resourceful  Supports:  spouse and family   Goal/Needs for Treatment:  In order of importance to patient 1) Encourage the patient to use assertiveness skills and boundary setting application to the client's daily life.   2) Develop healthy interpersonal relationships that lead to the alleviation and help prevent the relapse of depression.  3) Balance life activities between consideration of others and development of own interests.    Client Statement of Needs: requires support, guidance and skills to manage emotional, family and work struggles as well as for life transitions.   Treatment Level: Outpatient Individual Psychotherapy  Symptoms:  Decrease or loss of appetite.-- Skipping meals due to loss of appetite, or busy schedule  Depressed or irritable mood.-- Sad affect most of the time, easily losses patience  Diminished interest in or enjoyment of activities. -- Difficulties with motivation for normal everyday activities  Feelings of hopelessness, worthlessness, or inappropriate guilt.-- Familiy pressures making her feel guilty, disappointed in self  History of chronic or recurrent depression for which the client has taken antidepressant medication, had outpatient treatment.  Lack of energy.-- frequent fatigue  Low self-esteem. Poor concentration and  indecisiveness.   Client Treatment Preferences: to continue with present therapist   Healthcare consumer's goal for treatment:  Psychologist, Lynn Gonzales, Ph.D.,  will support the patient's ability to achieve the goals identified. Cognitive Behavioral Therapy, Dialectical Behavioral Therapy, Motivational Interviewing, parent training, and other evidenced-based practices will be used to promote progress towards healthy functioning.   Healthcare consumer Lynn Gonzales will: Actively participate in therapy, working towards healthy functioning.    *Justification for Continuation/Discontinuation of Goal: R=Revised, O=Ongoing, A=Achieved, D=Discontinued  Goal 1) Learn and apply problem-solving skills to current circumstances in order to maintain  positive mood states.  5-Point Likert rating baseline date: 06/19/2021 Target Date Goal Was reviewed Status Code Progress towards goal/Likert rating  06/20/2022 06/19/2021         O 4/5 - Pt has learned and is implementing p/s skills that support positive mood states             Goal 2) Develop healthy interpersonal relationships that lead to the alleviation and help prevent  the relapse of depression.  5-Point Likert rating baseline date: 06/19/2021 Target Date Goal Was reviewed Status Code 3/5 - Progress towards goal/Likert rating  06/20/2022 06/19/2021          O Pt has learned interpersonal skills that supporting positive mood states             Goal 3) Learn and apply coping skills to current circumstances in order to maintain positive mood  states.  5-Point Likert rating baseline date: 06/19/2021 Target Date Goal Was reviewed Status Code Progress towards goal/Likert rating  06/20/2022 06/19/2021            O 2/5 - Pt  This plan has been reviewed and created by the following participants:  This plan will be reviewed  at least every 12 months. Date Behavioral Health Clinician Date Guardian/Patient   06/19/2021  Lynn Gonzales, Ph.D.  06/19/2021 Lynn Gonzales reports that she had to navigate some difficult family dynamics.  We d/e/p these dynamics with her twin sister, mother and husband.  She shared that she had to go to the hospital because she was having consistent contractions and feeling pressure.  We d/p her anxiety and feelings of "loss of control."  Lastly, we d/ her birth plan, what she needs from her mother as support from her and support what she doesn't need from her sisters.     Lynn Crochet, PhD

## 2022-02-25 ENCOUNTER — Ambulatory Visit (INDEPENDENT_AMBULATORY_CARE_PROVIDER_SITE_OTHER): Payer: BC Managed Care – PPO | Admitting: Psychology

## 2022-02-25 DIAGNOSIS — F330 Major depressive disorder, recurrent, mild: Secondary | ICD-10-CM | POA: Diagnosis not present

## 2022-02-25 NOTE — Progress Notes (Signed)
PROGRESS NOTE:   Name: Lynn Gonzales Date: 02/25/2022 MRN: [id number] DOB: 05-Oct-1995 PCP: Pcp, No  Start Time: 11:00 AM Stop Time: 11:55 AM   Note: Annual Review 06-20-2022   Today I met with  Lynn Gonzales in remote video (WebEx) face-to-face individual psychotherapy.     Distance Site: Client's Home Orginating Site: Dr. Jannifer Franklin Remote Office Consent: Obtained verbal consent to transmit  session remotely    Diagnosis F33.41 Major Depressive Disorder, recurrent, in remission F41.1  Generalized Anxiety Disorder   Individualized Treatment Plan                Strengths: Bright, verbal, motivated and resourceful  Supports:  spouse and family   Goal/Needs for Treatment:  In order of importance to patient 1) Encourage the patient to use assertiveness skills and boundary setting application to the client's daily life.   2) Develop healthy interpersonal relationships that lead to the alleviation and help prevent the relapse of depression.  3) Balance life activities between consideration of others and development of own interests.    Client Statement of Needs: requires support, guidance and skills to manage emotional, family and work struggles as well as for life transitions.   Treatment Level: Outpatient Individual Psychotherapy  Symptoms:  Decrease or loss of appetite.-- Skipping meals due to loss of appetite, or busy schedule  Depressed or irritable mood.-- Sad affect most of the time, easily losses patience  Diminished interest in or enjoyment of activities. -- Difficulties with motivation for normal everyday activities  Feelings of hopelessness, worthlessness, or inappropriate guilt.-- Familiy pressures making her feel guilty, disappointed in self  History of chronic or recurrent depression for which the client has taken antidepressant medication, had outpatient treatment.  Lack of energy.-- frequent fatigue  Low self-esteem. Poor concentration and  indecisiveness.   Client Treatment Preferences: to continue with present therapist   Healthcare consumer's goal for treatment:  Psychologist, [name], Ph.D.,  will support the patient's ability to achieve the goals identified. Cognitive Behavioral Therapy, Dialectical Behavioral Therapy, Motivational Interviewing, parent training, and other evidenced-based practices will be used to promote progress towards healthy functioning.   Healthcare consumer [name] will: Actively participate in therapy, working towards healthy functioning.    *Justification for Continuation/Discontinuation of Goal: R=Revised, O=Ongoing, A=Achieved, D=Discontinued  Goal 1) Learn and apply problem-solving skills to current circumstances in order to maintain  positive mood states.  5-Point Likert rating baseline date: 06/19/2021 Target Date Goal Was reviewed Status Code Progress towards goal/Likert rating  06/20/2022 06/19/2021         O 4/5 - Pt has learned and is implementing p/s skills that support positive mood states             Goal 2) Develop healthy interpersonal relationships that lead to the alleviation and help prevent  the relapse of depression.  5-Point Likert rating baseline date: 06/19/2021 Target Date Goal Was reviewed Status Code 3/5 - Progress towards goal/Likert rating  06/20/2022 06/19/2021          O Pt has learned interpersonal skills that supporting positive mood states             Goal 3) Learn and apply coping skills to current circumstances in order to maintain positive mood  states.  5-Point Likert rating baseline date: 06/19/2021 Target Date Goal Was reviewed Status Code Progress towards goal/Likert rating  06/20/2022 06/19/2021            O 2/5 - Pt  This plan has been reviewed and created by the following participants:  This plan will be reviewed  at least every 12 months. Date Behavioral Health Clinician Date Guardian/Patient   06/19/2021  [name], Ph.D.  06/19/2021 Lynn Gonzales reports that she had to navigate some difficult work dynamics with co-workers.  We d/e the situation at work and how to prepare for her management meeting.  Lynn Gonzales states that there were some issues around behavior management, developmental issues and differing expectations.  We d/e/p the possible root of their differences and some possible ways to bridge the gap of information.   [name], PhD

## 2022-03-04 ENCOUNTER — Ambulatory Visit (INDEPENDENT_AMBULATORY_CARE_PROVIDER_SITE_OTHER): Payer: BC Managed Care – PPO | Admitting: Psychology

## 2022-03-04 DIAGNOSIS — F330 Major depressive disorder, recurrent, mild: Secondary | ICD-10-CM

## 2022-03-04 NOTE — Progress Notes (Signed)
PROGRESS NOTE:   Name: Lynn Gonzales Date: 03/04/2022 MRN: [id number] DOB: Nov 19, 1995 PCP: Pcp, No  Start Time: 11:00 AM Stop Time: 11:55 AM   Note: Annual Review 06-20-2022   Today I met with  [name] in remote video (WebEx) face-to-face individual psychotherapy.     Distance Site: Client's Home Orginating Site: Dr. Jannifer Franklin Remote Office Consent: Obtained verbal consent to transmit  session remotely    Diagnosis F33.41 Major Depressive Disorder, recurrent, in remission F41.1  Generalized Anxiety Disorder   Individualized Treatment Plan                Strengths: Bright, verbal, motivated and resourceful  Supports:  spouse and family   Goal/Needs for Treatment:  In order of importance to patient 1) Encourage the patient to use assertiveness skills and boundary setting application to the client's daily life.   2) Develop healthy interpersonal relationships that lead to the alleviation and help prevent the relapse of depression.  3) Balance life activities between consideration of others and development of own interests.    Client Statement of Needs: requires support, guidance and skills to manage emotional, family and work struggles as well as for life transitions.   Treatment Level: Outpatient Individual Psychotherapy  Symptoms:  Decrease or loss of appetite.-- Skipping meals due to loss of appetite, or busy schedule  Depressed or irritable mood.-- Sad affect most of the time, easily losses patience  Diminished interest in or enjoyment of activities. -- Difficulties with motivation for normal everyday activities  Feelings of hopelessness, worthlessness, or inappropriate guilt.-- Familiy pressures making her feel guilty, disappointed in self  History of chronic or recurrent depression for which the client has taken antidepressant medication, had outpatient treatment.  Lack of energy.-- frequent fatigue  Low self-esteem. Poor concentration and  indecisiveness.   Client Treatment Preferences: to continue with present therapist   Healthcare consumer's goal for treatment:  Psychologist, [name], Ph.D.,  will support the patient's ability to achieve the goals identified. Cognitive Behavioral Therapy, Dialectical Behavioral Therapy, Motivational Interviewing, parent training, and other evidenced-based practices will be used to promote progress towards healthy functioning.   Healthcare consumer Louella Medaglia will: Actively participate in therapy, working towards healthy functioning.    *Justification for Continuation/Discontinuation of Goal: R=Revised, O=Ongoing, A=Achieved, D=Discontinued  Goal 1) Learn and apply problem-solving skills to current circumstances in order to maintain  positive mood states.  5-Point Likert rating baseline date: 06/19/2021 Target Date Goal Was reviewed Status Code Progress towards goal/Likert rating  06/20/2022 06/19/2021         O 4/5 - Pt has learned and is implementing p/s skills that support positive mood states             Goal 2) Develop healthy interpersonal relationships that lead to the alleviation and help prevent  the relapse of depression.  5-Point Likert rating baseline date: 06/19/2021 Target Date Goal Was reviewed Status Code 3/5 - Progress towards goal/Likert rating  06/20/2022 06/19/2021          O Pt has learned interpersonal skills that supporting positive mood states             Goal 3) Learn and apply coping skills to current circumstances in order to maintain positive mood  states.  5-Point Likert rating baseline date: 06/19/2021 Target Date Goal Was reviewed Status Code Progress towards goal/Likert rating  06/20/2022 06/19/2021            O 2/5 - Pt  This plan has been reviewed and created by the following participants:  This plan will be reviewed  at least every 12 months. Date Behavioral Health Clinician Date Guardian/Patient   06/19/2021  [name], Ph.D.  06/19/2021 [name] reports that she has been feeling a lot of anger this past week.  She has been needing to distance herself from [place] and [employer] periodically.  She was anxious and confused why she is feeling this way.  Milany suspects it's hormonal.  I observed that there has been so much going on that has been frustrating and remains unresolved despite trying to anticipate difficulties.  Kaytlynne could see how all these factors combined may be heightening her emotions.  Lastly, we d/e/p some child behavior issues and questions Alizaya had about her son.  I offered guidance, provided child development information and reassurance that intuitively, Toccara was providing Amsi the guidance and reassurance he requires.   [name], PhD

## 2022-03-11 ENCOUNTER — Ambulatory Visit (INDEPENDENT_AMBULATORY_CARE_PROVIDER_SITE_OTHER): Payer: BC Managed Care – PPO | Admitting: Psychology

## 2022-03-11 DIAGNOSIS — F330 Major depressive disorder, recurrent, mild: Secondary | ICD-10-CM

## 2022-03-11 NOTE — Progress Notes (Signed)
PROGRESS NOTE:   Name: Lynn Gonzales Date: 03/11/2022 MRN: [id number] DOB: [birth year] PCP: Pcp, No  Start Time: 11:00 AM Stop Time: 11:55 AM   Note: Annual Review 06-20-2022   Today I met with  Lynn Gonzales in remote video (WebEx) face-to-face individual psychotherapy.     Distance Site: Client's Home Orginating Site: Dr. Jannifer Franklin Remote Office Consent: Obtained verbal consent to transmit  session remotely    Diagnosis F33.41 Major Depressive Disorder, recurrent, in remission F41.1  Generalized Anxiety Disorder   Individualized Treatment Plan                Strengths: Bright, verbal, motivated and resourceful  Supports:  spouse and family   Goal/Needs for Treatment:  In order of importance to patient 1) Encourage the patient to use assertiveness skills and boundary setting application to the client's daily life.   2) Develop healthy interpersonal relationships that lead to the alleviation and help prevent the relapse of depression.  3) Balance life activities between consideration of others and development of own interests.    Client Statement of Needs: requires support, guidance and skills to manage emotional, family and work struggles as well as for life transitions.   Treatment Level: Outpatient Individual Psychotherapy  Symptoms:  Decrease or loss of appetite.-- Skipping meals due to loss of appetite, or busy schedule  Depressed or irritable mood.-- Sad affect most of the time, easily losses patience  Diminished interest in or enjoyment of activities. -- Difficulties with motivation for normal everyday activities  Feelings of hopelessness, worthlessness, or inappropriate guilt.-- Familiy pressures making her feel guilty, disappointed in self  History of chronic or recurrent depression for which the client has taken antidepressant medication, had outpatient treatment.  Lack of energy.-- frequent fatigue  Low self-esteem. Poor concentration and  indecisiveness.   Client Treatment Preferences: to continue with present therapist   Healthcare consumer's goal for treatment:  Psychologist, Lynn Gonzales, Ph.D.,  will support the patient's ability to achieve the goals identified. Cognitive Behavioral Therapy, Dialectical Behavioral Therapy, Motivational Interviewing, parent training, and other evidenced-based practices will be used to promote progress towards healthy functioning.   Healthcare consumer Lynn Gonzales will: Actively participate in therapy, working towards healthy functioning.    *Justification for Continuation/Discontinuation of Goal: R=Revised, O=Ongoing, A=Achieved, D=Discontinued  Goal 1) Learn and apply problem-solving skills to current circumstances in order to maintain  positive mood states.  5-Point Likert rating baseline date: 06/19/2021 Target Date Goal Was reviewed Status Code Progress towards goal/Likert rating  06/20/2022 06/19/2021         O 4/5 - Pt has learned and is implementing p/s skills that support positive mood states             Goal 2) Develop healthy interpersonal relationships that lead to the alleviation and help prevent  the relapse of depression.  5-Point Likert rating baseline date: 06/19/2021 Target Date Goal Was reviewed Status Code 3/5 - Progress towards goal/Likert rating  06/20/2022 06/19/2021          O Pt has learned interpersonal skills that supporting positive mood states             Goal 3) Learn and apply coping skills to current circumstances in order to maintain positive mood  states.  5-Point Likert rating baseline date: 06/19/2021 Target Date Goal Was reviewed Status Code Progress towards goal/Likert rating  06/20/2022 06/19/2021            O 2/5 - Pt  This plan has been reviewed and created by the following participants:  This plan will be reviewed  at least every 12 months. Date Behavioral Health Clinician Date Guardian/Patient   06/19/2021  Lynn Gonzales, Ph.D.  06/19/2021 Lynn Gonzales reports that she was in the hospital again because she was having intense pain (false contractions).  She is struggling with a lot of anxieties related to the birth and post-delivery.  We d/e/p several issues of concern.  I provided information, support and guidance as needed to help [name] ease some of her anxiety, make connections between the past and present and shift her perspective on some key issues.  By the end of our session, she was feeling more at ease and able to create a plan of action that directly addressed her concerns.   Lynn Crochet, PhD

## 2022-03-18 ENCOUNTER — Ambulatory Visit (INDEPENDENT_AMBULATORY_CARE_PROVIDER_SITE_OTHER): Payer: BC Managed Care – PPO | Admitting: Psychology

## 2022-03-18 DIAGNOSIS — F411 Generalized anxiety disorder: Secondary | ICD-10-CM | POA: Diagnosis not present

## 2022-03-18 DIAGNOSIS — F330 Major depressive disorder, recurrent, mild: Secondary | ICD-10-CM

## 2022-03-18 NOTE — Progress Notes (Signed)
PROGRESS NOTE:   Name: Lynn Gonzales Date: 03/18/2022 MRN: [id number] DOB: February 15, 1996 PCP: Pcp, No  Start Time: 11:00 AM Stop Time: 11:55 AM   Note: Annual Review 06-20-2022   Today I met with  [name] in remote video (WebEx) face-to-face individual psychotherapy.     Distance Site: Client's Home Orginating Site: Dr. Jannifer Franklin Remote Office Consent: Obtained verbal consent to transmit  session remotely    Diagnosis F33.41 Major Depressive Disorder, recurrent, in remission F41.1  Generalized Anxiety Disorder        Individualized Treatment Plan                Strengths: Bright, verbal, motivated and resourceful  Supports:  spouse and family   Goal/Needs for Treatment:  In order of importance to patient 1) Encourage the patient to use assertiveness skills and boundary setting application to the client's daily life.   2) Develop healthy interpersonal relationships that lead to the alleviation and help prevent the relapse of depression.  3) Balance life activities between consideration of others and development of own interests.    Client Statement of Needs: requires support, guidance and skills to manage emotional, family and work struggles as well as for life transitions.   Treatment Level: Outpatient Individual Psychotherapy  Symptoms:  Decrease or loss of appetite.-- Skipping meals due to loss of appetite, or busy schedule  Depressed or irritable mood.-- Sad affect most of the time, easily losses patience  Diminished interest in or enjoyment of activities. -- Difficulties with motivation for normal everyday activities  Feelings of hopelessness, worthlessness, or inappropriate guilt.-- Familiy pressures making her feel guilty, disappointed in self  History of chronic or recurrent depression for which the client has taken antidepressant medication, had outpatient treatment.  Lack of energy.-- frequent fatigue  Low self-esteem. Poor concentration and  indecisiveness.   Client Treatment Preferences: to continue with present therapist   Healthcare consumer's goal for treatment:  Psychologist, [name], Ph.D.,  will support the patient's ability to achieve the goals identified. Cognitive Behavioral Therapy, Dialectical Behavioral Therapy, Motivational Interviewing, parent training, and other evidenced-based practices will be used to promote progress towards healthy functioning.   Healthcare consumer Kanya Potteiger will: Actively participate in therapy, working towards healthy functioning.    *Justification for Continuation/Discontinuation of Goal: R=Revised, O=Ongoing, A=Achieved, D=Discontinued  Goal 1) Learn and apply problem-solving skills to current circumstances in order to maintain  positive mood states.  5-Point Likert rating baseline date: 06/19/2021 Target Date Goal Was reviewed Status Code Progress towards goal/Likert rating  06/20/2022 06/19/2021         O 4/5 - Pt has learned and is implementing p/s skills that support positive mood states             Goal 2) Develop healthy interpersonal relationships that lead to the alleviation and help prevent  the relapse of depression.  5-Point Likert rating baseline date: 06/19/2021 Target Date Goal Was reviewed Status Code 3/5 - Progress towards goal/Likert rating  06/20/2022 06/19/2021          O Pt has learned interpersonal skills that supporting positive mood states             Goal 3) Learn and apply coping skills to current circumstances in order to maintain positive mood  states.  5-Point Likert rating baseline date: 06/19/2021 Target Date Goal Was reviewed Status Code Progress towards goal/Likert rating  06/20/2022 06/19/2021            O  2/5 - Pt                This plan has been reviewed and created by the following participants:  This plan will be reviewed  at least every 12 months. Date Behavioral Health Clinician Date Guardian/Patient   06/19/2021  [name], Ph.D.  06/19/2021 [name] reports that she had a bad night with cramping and pain.  She is feeling low, guilty and anxious.  We d/e/p what was contributing to her feelings of depression.  I normalized her responses and contextualized what she was experiencing.  I offered some suggestions for re-writing the negative narrative in her mind and to see how her action plan is actually beneficial for everyone.  Lastly, we talked some about the risk of post-partum depression and that it would be a good idea to open the topic with her OB.  While there is no guarantee that she would continue to experience depression after delivery, given that many of the stress factors will be resolved, nonetheless, I would be good to have a plan in place to reduce her stress now.    [name], PhD

## 2022-03-25 ENCOUNTER — Ambulatory Visit: Payer: BC Managed Care – PPO | Admitting: Psychology

## 2022-03-31 ENCOUNTER — Ambulatory Visit (INDEPENDENT_AMBULATORY_CARE_PROVIDER_SITE_OTHER): Payer: BC Managed Care – PPO | Admitting: Psychology

## 2022-03-31 DIAGNOSIS — F330 Major depressive disorder, recurrent, mild: Secondary | ICD-10-CM | POA: Diagnosis not present

## 2022-03-31 NOTE — Progress Notes (Signed)
PROGRESS NOTE:   Name: Lynn Gonzales Date: 03/31/2022 MRN: [id number] DOB: 16-Mar-1995 PCP: Pcp, No  Start Time: 11:00 AM Stop Time: 11:55 AM   Note: Annual Review 06-20-2022   Today I met with  Lynn Gonzales in remote video (WebEx) face-to-face individual psychotherapy.     Distance Site: Client's Home Orginating Site: Dr. Jannifer Franklin Remote Office Consent: Obtained verbal consent to transmit  session remotely    Diagnosis F33.41 Major Depressive Disorder, recurrent, in remission F41.1  Generalized Anxiety Disorder        Individualized Treatment Plan                Strengths: Bright, verbal, motivated and resourceful  Supports:  spouse and family   Goal/Needs for Treatment:  In order of importance to patient 1) Encourage the patient to use assertiveness skills and boundary setting application to the client's daily life.   2) Develop healthy interpersonal relationships that lead to the alleviation and help prevent the relapse of depression.  3) Balance life activities between consideration of others and development of own interests.    Client Statement of Needs: requires support, guidance and skills to manage emotional, family and work struggles as well as for life transitions.   Treatment Level: Outpatient Individual Psychotherapy  Symptoms:  Decrease or loss of appetite.-- Skipping meals due to loss of appetite, or busy schedule  Depressed or irritable mood.-- Sad affect most of the time, easily losses patience  Diminished interest in or enjoyment of activities. -- Difficulties with motivation for normal everyday activities  Feelings of hopelessness, worthlessness, or inappropriate guilt.-- Familiy pressures making her feel guilty, disappointed in self  History of chronic or recurrent depression for which the client has taken antidepressant medication, had outpatient treatment.  Lack of energy.-- frequent fatigue  Low self-esteem. Poor concentration and  indecisiveness.   Client Treatment Preferences: to continue with present therapist   Healthcare consumer's goal for treatment:  Psychologist, [name], Ph.D.,  will support the patient's ability to achieve the goals identified. Cognitive Behavioral Therapy, Dialectical Behavioral Therapy, Motivational Interviewing, parent training, and other evidenced-based practices will be used to promote progress towards healthy functioning.   Healthcare consumer Lynn Gonzales will: Actively participate in therapy, working towards healthy functioning.    *Justification for Continuation/Discontinuation of Goal: R=Revised, O=Ongoing, A=Achieved, D=Discontinued  Goal 1) Learn and apply problem-solving skills to current circumstances in order to maintain  positive mood states.  5-Point Likert rating baseline date: 06/19/2021 Target Date Goal Was reviewed Status Code Progress towards goal/Likert rating  06/20/2022 06/19/2021         O 4/5 - Pt has learned and is implementing p/s skills that support positive mood states             Goal 2) Develop healthy interpersonal relationships that lead to the alleviation and help prevent  the relapse of depression.  5-Point Likert rating baseline date: 06/19/2021 Target Date Goal Was reviewed Status Code 3/5 - Progress towards goal/Likert rating  06/20/2022 06/19/2021          O Pt has learned interpersonal skills that supporting positive mood states             Goal 3) Learn and apply coping skills to current circumstances in order to maintain positive mood  states.  5-Point Likert rating baseline date: 06/19/2021 Target Date Goal Was reviewed Status Code Progress towards goal/Likert rating  06/20/2022 06/19/2021            O  2/5 - Pt                This plan has been reviewed and created by the following participants:  This plan will be reviewed  at least every 12 months. Date Behavioral Health Clinician Date Guardian/Patient   06/19/2021  [name], Ph.D.  06/19/2021 Lynn Gonzales reports that she was induced and had her baby over the weekend.  It has been a stressful week.  We d/p all her various stressors and anxieties of the week.  I provided the support and guidance she needed.  I also encouraged her to take 5-10 minutes to calm and center herself in an effort to lower her some her anxiety.   [name], PhD

## 2022-04-01 ENCOUNTER — Ambulatory Visit: Payer: BC Managed Care – PPO | Admitting: Psychology

## 2022-04-08 ENCOUNTER — Ambulatory Visit (INDEPENDENT_AMBULATORY_CARE_PROVIDER_SITE_OTHER): Payer: BC Managed Care – PPO | Admitting: Psychology

## 2022-04-08 DIAGNOSIS — F330 Major depressive disorder, recurrent, mild: Secondary | ICD-10-CM

## 2022-04-08 NOTE — Progress Notes (Signed)
PROGRESS NOTE:   Name: Lynn Gonzales Date: 04/08/2022 MRN: [id number] DOB: 05/09/1995 PCP: Pcp, No  Start Time: 11:00 AM Stop Time: 11:55 AM   Note: Annual Review 06-20-2022   Today I met with  Lynn Gonzales in remote video (WebEx) face-to-face individual psychotherapy.     Distance Site: Client's Home Orginating Site: Dr. Jannifer Franklin Remote Office Consent: Obtained verbal consent to transmit  session remotely    Diagnosis F33.41 Major Depressive Disorder, recurrent, in remission F41.1  Generalized Anxiety Disorder        Individualized Treatment Plan                Strengths: Bright, verbal, motivated and resourceful  Supports:  spouse and family   Goal/Needs for Treatment:  In order of importance to patient 1) Encourage the patient to use assertiveness skills and boundary setting application to the client's daily life.   2) Develop healthy interpersonal relationships that lead to the alleviation and help prevent the relapse of depression.  3) Balance life activities between consideration of others and development of own interests.    Client Statement of Needs: requires support, guidance and skills to manage emotional, family and work struggles as well as for life transitions.   Treatment Level: Outpatient Individual Psychotherapy  Symptoms:  Decrease or loss of appetite.-- Skipping meals due to loss of appetite, or busy schedule  Depressed or irritable mood.-- Sad affect most of the time, easily losses patience  Diminished interest in or enjoyment of activities. -- Difficulties with motivation for normal everyday activities  Feelings of hopelessness, worthlessness, or inappropriate guilt.-- Familiy pressures making her feel guilty, disappointed in self  History of chronic or recurrent depression for which the client has taken antidepressant medication, had outpatient treatment.  Lack of energy.-- frequent fatigue  Low self-esteem. Poor concentration and  indecisiveness.   Client Treatment Preferences: to continue with present therapist   Healthcare consumer's goal for treatment:  Psychologist, [name], Ph.D.,  will support the patient's ability to achieve the goals identified. Cognitive Behavioral Therapy, Dialectical Behavioral Therapy, Motivational Interviewing, parent training, and other evidenced-based practices will be used to promote progress towards healthy functioning.   Healthcare consumer Lynn Gonzales will: Actively participate in therapy, working towards healthy functioning.    *Justification for Continuation/Discontinuation of Goal: R=Revised, O=Ongoing, A=Achieved, D=Discontinued  Goal 1) Learn and apply problem-solving skills to current circumstances in order to maintain  positive mood states.  5-Point Likert rating baseline date: 06/19/2021 Target Date Goal Was reviewed Status Code Progress towards goal/Likert rating  06/20/2022 06/19/2021         O 4/5 - Pt has learned and is implementing p/s skills that support positive mood states             Goal 2) Develop healthy interpersonal relationships that lead to the alleviation and help prevent  the relapse of depression.  5-Point Likert rating baseline date: 06/19/2021 Target Date Goal Was reviewed Status Code 3/5 - Progress towards goal/Likert rating  06/20/2022 06/19/2021          O Pt has learned interpersonal skills that supporting positive mood states             Goal 3) Learn and apply coping skills to current circumstances in order to maintain positive mood  states.  5-Point Likert rating baseline date: 06/19/2021 Target Date Goal Was reviewed Status Code Progress towards goal/Likert rating  06/20/2022 06/19/2021            O  2/5 - Pt                This plan has been reviewed and created by the following participants:  This plan will be reviewed  at least every 12 months. Date Behavioral Health Clinician Date Guardian/Patient   06/19/2021  [name], Ph.D.  06/19/2021 Lynn Gonzales reports that she is wanting to distance herself from her family.  She shared a number of interactions that occurred this past week with her various family member.  Lynn Gonzales has been wondering if she needs to "cut them off."   We d/e/p family roles, dynamics and how not to personalize their issues.    [name], PhD

## 2022-04-15 ENCOUNTER — Ambulatory Visit (INDEPENDENT_AMBULATORY_CARE_PROVIDER_SITE_OTHER): Payer: BC Managed Care – PPO | Admitting: Psychology

## 2022-04-15 DIAGNOSIS — F330 Major depressive disorder, recurrent, mild: Secondary | ICD-10-CM | POA: Diagnosis not present

## 2022-04-15 NOTE — Progress Notes (Signed)
PROGRESS NOTE:   Name: Lynn Gonzales Date: 04/15/2022 MRN: [id number] DOB: 12/12/1995 PCP: Pcp, No  Start Time: 11:00 AM Stop Time: 11:55 AM   Note: Annual Review 06-20-2022   Today I met with  Lynn Gonzales in remote video (WebEx) face-to-face individual psychotherapy.     Distance Site: Client's Home Orginating Site: Dr. Jannifer Franklin Remote Office Consent: Obtained verbal consent to transmit  session remotely    Diagnosis F33.41 Major Depressive Disorder, recurrent, in remission F41.1  Generalized Anxiety Disorder        Individualized Treatment Plan                Strengths: Bright, verbal, motivated and resourceful  Supports:  spouse and family   Goal/Needs for Treatment:  In order of importance to patient 1) Encourage the patient to use assertiveness skills and boundary setting application to the client's daily life.   2) Develop healthy interpersonal relationships that lead to the alleviation and help prevent the relapse of depression.  3) Balance life activities between consideration of others and development of own interests.    Client Statement of Needs: requires support, guidance and skills to manage emotional, family and work struggles as well as for life transitions.   Treatment Level: Outpatient Individual Psychotherapy  Symptoms:  Decrease or loss of appetite.-- Skipping meals due to loss of appetite, or busy schedule  Depressed or irritable mood.-- Sad affect most of the time, easily losses patience  Diminished interest in or enjoyment of activities. -- Difficulties with motivation for normal everyday activities  Feelings of hopelessness, worthlessness, or inappropriate guilt.-- Familiy pressures making her feel guilty, disappointed in self  History of chronic or recurrent depression for which the client has taken antidepressant medication, had outpatient treatment.  Lack of energy.-- frequent fatigue  Low self-esteem. Poor concentration and  indecisiveness.   Client Treatment Preferences: to continue with present therapist   Healthcare consumer's goal for treatment:  Psychologist, [name], Ph.D.,  will support the patient's ability to achieve the goals identified. Cognitive Behavioral Therapy, Dialectical Behavioral Therapy, Motivational Interviewing, parent training, and other evidenced-based practices will be used to promote progress towards healthy functioning.   Healthcare consumer Lynn Gonzales will: Actively participate in therapy, working towards healthy functioning.    *Justification for Continuation/Discontinuation of Goal: R=Revised, O=Ongoing, A=Achieved, D=Discontinued  Goal 1) Learn and apply problem-solving skills to current circumstances in order to maintain  positive mood states.  5-Point Likert rating baseline date: 06/19/2021 Target Date Goal Was reviewed Status Code Progress towards goal/Likert rating  06/20/2022 06/19/2021         O 4/5 - Pt has learned and is implementing p/s skills that support positive mood states             Goal 2) Develop healthy interpersonal relationships that lead to the alleviation and help prevent  the relapse of depression.  5-Point Likert rating baseline date: 06/19/2021 Target Date Goal Was reviewed Status Code 3/5 - Progress towards goal/Likert rating  06/20/2022 06/19/2021          O Pt has learned interpersonal skills that supporting positive mood states             Goal 3) Learn and apply coping skills to current circumstances in order to maintain positive mood  states.  5-Point Likert rating baseline date: 06/19/2021 Target Date Goal Was reviewed Status Code Progress towards goal/Likert rating  06/20/2022 06/19/2021            O  2/5 - Pt                This plan has been reviewed and created by the following participants:  This plan will be reviewed  at least every 12 months. Date Behavioral Health Clinician Date Guardian/Patient   06/19/2021  [name], Ph.D.  06/19/2021 Lynn Gonzales reports that she finds herself feeling angry with her husband.  We d/e/p what occurred, how she felt, how she responded and some alternative ways to respond.  I provided some psycho education about ADHD and typical sleep patterns and offered some suggestions for how to handle situations d/ that were causing her distress.  We addressed her anxieties, her realistic concerns and how to move forward.    [name], PhD

## 2022-04-22 ENCOUNTER — Ambulatory Visit (INDEPENDENT_AMBULATORY_CARE_PROVIDER_SITE_OTHER): Payer: BC Managed Care – PPO | Admitting: Psychology

## 2022-04-22 DIAGNOSIS — F4320 Adjustment disorder, unspecified: Secondary | ICD-10-CM

## 2022-04-22 NOTE — Progress Notes (Signed)
PROGRESS NOTE:   Name: Lynn Gonzales Date: 04/22/2022 MRN: [id number] DOB: 1995/03/23 PCP: Pcp, No  Start Time: 11:00 AM Stop Time: 11:55 AM   Note: Annual Review 06-20-2022   Today I met with  [name] in remote video (WebEx) face-to-face individual psychotherapy.     Distance Site: Client's Home Orginating Site: Dr. Jannifer Franklin Remote Office Consent: Obtained verbal consent to transmit  session remotely    Diagnosis F33.41 Major Depressive Disorder, recurrent, in remission F41.1  Generalized Anxiety Disorder        Individualized Treatment Plan                Strengths: Bright, verbal, motivated and resourceful  Supports:  spouse and family   Goal/Needs for Treatment:  In order of importance to patient 1) Encourage the patient to use assertiveness skills and boundary setting application to the client's daily life.   2) Develop healthy interpersonal relationships that lead to the alleviation and help prevent the relapse of depression.  3) Balance life activities between consideration of others and development of own interests.    Client Statement of Needs: requires support, guidance and skills to manage emotional, family and work struggles as well as for life transitions.   Treatment Level: Outpatient Individual Psychotherapy  Symptoms:  Decrease or loss of appetite.-- Skipping meals due to loss of appetite, or busy schedule  Depressed or irritable mood.-- Sad affect most of the time, easily losses patience  Diminished interest in or enjoyment of activities. -- Difficulties with motivation for normal everyday activities  Feelings of hopelessness, worthlessness, or inappropriate guilt.-- Familiy pressures making her feel guilty, disappointed in self  History of chronic or recurrent depression for which the client has taken antidepressant medication, had outpatient treatment.  Lack of energy.-- frequent fatigue  Low self-esteem. Poor concentration and  indecisiveness.   Client Treatment Preferences: to continue with present therapist   Healthcare consumer's goal for treatment:  Psychologist, [name], Ph.D.,  will support the patient's ability to achieve the goals identified. Cognitive Behavioral Therapy, Dialectical Behavioral Therapy, Motivational Interviewing, parent training, and other evidenced-based practices will be used to promote progress towards healthy functioning.   Healthcare consumer Shaurice Jozwiak will: Actively participate in therapy, working towards healthy functioning.    *Justification for Continuation/Discontinuation of Goal: R=Revised, O=Ongoing, A=Achieved, D=Discontinued  Goal 1) Learn and apply problem-solving skills to current circumstances in order to maintain  positive mood states.  5-Point Likert rating baseline date: 06/19/2021 Target Date Goal Was reviewed Status Code Progress towards goal/Likert rating  06/20/2022 06/19/2021         O 4/5 - Pt has learned and is implementing p/s skills that support positive mood states             Goal 2) Develop healthy interpersonal relationships that lead to the alleviation and help prevent  the relapse of depression.  5-Point Likert rating baseline date: 06/19/2021 Target Date Goal Was reviewed Status Code 3/5 - Progress towards goal/Likert rating  06/20/2022 06/19/2021          O Pt has learned interpersonal skills that supporting positive mood states             Goal 3) Learn and apply coping skills to current circumstances in order to maintain positive mood  states.  5-Point Likert rating baseline date: 06/19/2021 Target Date Goal Was reviewed Status Code Progress towards goal/Likert rating  06/20/2022 06/19/2021            O  2/5 - Pt                This plan has been reviewed and created by the following participants:  This plan will be reviewed  at least every 12 months. Date Behavioral Health Clinician Date Guardian/Patient   06/19/2021  [name], Ph.D.  06/19/2021 [name] reports that she was frustrated with her husband after an incident with their son's being disciplined inappropriately.  We d/e/p what occurred, how she felt and how she responded to her husband.  I provided the support and guidance needed.  Jouri also shared that Mardene Sayer has continued to have nightmares.  We d/ what's occurring in the classroom that's contributing to his sleep disturbances and ways to approach the day care staff.  Lastly, Annie Sable had some concerns about the baby especially as her mother was being critical.  I normalized what was occurring and how she was feeling.  I encouraged her to take the baby for a stroll once a day to get out of the house, get some fresh air/sunshine and move.     [name], PhD

## 2022-04-29 ENCOUNTER — Ambulatory Visit (INDEPENDENT_AMBULATORY_CARE_PROVIDER_SITE_OTHER): Payer: BC Managed Care – PPO | Admitting: Psychology

## 2022-04-29 DIAGNOSIS — F411 Generalized anxiety disorder: Secondary | ICD-10-CM

## 2022-04-29 DIAGNOSIS — F330 Major depressive disorder, recurrent, mild: Secondary | ICD-10-CM | POA: Diagnosis not present

## 2022-04-29 NOTE — Progress Notes (Signed)
PROGRESS NOTE:   Name: Lynn Gonzales Date: 04/29/2022 MRN: [id number] DOB: 07/13/1995 PCP: Pcp, No  Start Time: 11:00 AM Stop Time: 11:55 AM   Note: Annual Review 06-20-2022   Today I met with  Lynn Gonzales and her husband Lynn Gonzales in remote video (WebEx) face-to-face individual psychotherapy.     Distance Site: Client's Home Orginating Site: Dr. Jannifer Franklin Remote Office Consent: Obtained verbal consent to transmit  session remotely    Diagnosis F33.41 Major Depressive Disorder, recurrent, in remission F41.1  Generalized Anxiety Disorder        Individualized Treatment Plan                Strengths: Bright, verbal, motivated and resourceful  Supports:  spouse and family   Goal/Needs for Treatment:  In order of importance to patient 1) Encourage the patient to use assertiveness skills and boundary setting application to the client's daily life.   2) Develop healthy interpersonal relationships that lead to the alleviation and help prevent the relapse of depression.  3) Balance life activities between consideration of others and development of own interests.    Client Statement of Needs: requires support, guidance and skills to manage emotional, family and work struggles as well as for life transitions.   Treatment Level: Outpatient Individual Psychotherapy  Symptoms:  Decrease or loss of appetite.-- Skipping meals due to loss of appetite, or busy schedule  Depressed or irritable mood.-- Sad affect most of the time, easily losses patience  Diminished interest in or enjoyment of activities. -- Difficulties with motivation for normal everyday activities  Feelings of hopelessness, worthlessness, or inappropriate guilt.-- Familiy pressures making her feel guilty, disappointed in self  History of chronic or recurrent depression for which the client has taken antidepressant medication, had outpatient treatment.  Lack of energy.-- frequent fatigue  Low  self-esteem. Poor concentration and indecisiveness.   Client Treatment Preferences: to continue with present therapist   Healthcare consumer's goal for treatment:  Psychologist, [name], Ph.D.,  will support the patient's ability to achieve the goals identified. Cognitive Behavioral Therapy, Dialectical Behavioral Therapy, Motivational Interviewing, parent training, and other evidenced-based practices will be used to promote progress towards healthy functioning.   Healthcare consumer Lynn Gonzales will: Actively participate in therapy, working towards healthy functioning.    *Justification for Continuation/Discontinuation of Goal: R=Revised, O=Ongoing, A=Achieved, D=Discontinued  Goal 1) Learn and apply problem-solving skills to current circumstances in order to maintain  positive mood states.  5-Point Likert rating baseline date: 06/19/2021 Target Date Goal Was reviewed Status Code Progress towards goal/Likert rating  06/20/2022 06/19/2021         O 4/5 - Pt has learned and is implementing p/s skills that support positive mood states             Goal 2) Develop healthy interpersonal relationships that lead to the alleviation and help prevent  the relapse of depression.  5-Point Likert rating baseline date: 06/19/2021 Target Date Goal Was reviewed Status Code 3/5 - Progress towards goal/Likert rating  06/20/2022 06/19/2021          O Pt has learned interpersonal skills that supporting positive mood states             Goal 3) Learn and apply coping skills to current circumstances in order to maintain positive mood  states.  5-Point Likert rating baseline date: 06/19/2021 Target Date Goal Was reviewed Status Code Progress towards goal/Likert rating  06/20/2022 06/19/2021  O 2/5 - Pt                This plan has been reviewed and created by the following participants:  This plan will be reviewed  at least every 12 months. Date Behavioral Health Clinician  Date Guardian/Patient   06/19/2021 [name], Ph.D.  06/19/2021 Lynn Gonzales reached out to ask if her husband could join [place] in our session today.  She reported that she was struggling with a couple of issues with him and was thinking about asking him to leave the house for a time.  I agreed that it would be a good idea to try to de-escalate some issues that have been brewing and that they have not been successful in managing on their own.  They each were given time to share their concerns, what they thought they might try to do differently and offered suggestions for improving their relationship.  I encouraged Lynn Gonzales to talk to his new therapist about his depression and medication and evaluating him for adult ADHD.  He was able to see the merit in these suggestions after I provided some psycho education and tied some of their reoccurring problems to his mood and attention problems.  Lynn Gonzales and Lynn Gonzales agreed to a follow up session next week.   [name], PhD

## 2022-05-06 ENCOUNTER — Ambulatory Visit (INDEPENDENT_AMBULATORY_CARE_PROVIDER_SITE_OTHER): Payer: BC Managed Care – PPO | Admitting: Psychology

## 2022-05-06 DIAGNOSIS — F411 Generalized anxiety disorder: Secondary | ICD-10-CM

## 2022-05-06 NOTE — Progress Notes (Signed)
PROGRESS NOTE:   Name: Lynn Gonzales Date: 05/06/2022 MRN: [id number] DOB: [birth year] PCP: Pcp, No  Start Time: 11:00 AM Stop Time: 11:55 AM   Note: Annual Review 06-20-2022   Today I met with  Lynn Gonzales and her husband Lynn Gonzales in remote video (WebEx) face-to-face individual psychotherapy.     Distance Site: Client's Home Orginating Site: Dr. Jannifer Franklin Remote Office Consent: Obtained verbal consent to transmit  session remotely         Individualized Treatment Plan                Strengths: Bright, verbal, motivated and resourceful  Supports:  spouse and family   Goal/Needs for Treatment:  In order of importance to patient 1) Encourage the patient to use assertiveness skills and boundary setting application to the client's daily life.   2) Develop healthy interpersonal relationships that lead to the alleviation and help prevent the relapse of depression.  3) Balance life activities between consideration of others and development of own interests.    Client Statement of Needs: requires support, guidance and skills to manage emotional, family and work struggles as well as for life transitions.   Treatment Level: Outpatient Individual Psychotherapy  Symptoms:  Decrease or loss of appetite.-- Skipping meals due to loss of appetite, or busy schedule  Depressed or irritable mood.-- Sad affect most of the time, easily losses patience  Diminished interest in or enjoyment of activities. -- Difficulties with motivation for normal everyday activities  Feelings of hopelessness, worthlessness, or inappropriate guilt.-- Familiy pressures making her feel guilty, disappointed in self  History of chronic or recurrent depression for which the client has taken antidepressant medication, had outpatient treatment.  Lack of energy.-- frequent fatigue  Low self-esteem. Poor concentration and indecisiveness.   Client Treatment Preferences: to continue with present therapist    Healthcare consumer's goal for treatment:  Psychologist, Lynn Gonzales, Ph.D.,  will support the patient's ability to achieve the goals identified. Cognitive Behavioral Therapy, Dialectical Behavioral Therapy, Motivational Interviewing, parent training, and other evidenced-based practices will be used to promote progress towards healthy functioning.   Healthcare consumer [name] will: Actively participate in therapy, working towards healthy functioning.    *Justification for Continuation/Discontinuation of Goal: R=Revised, O=Ongoing, A=Achieved, D=Discontinued  Goal 1) Learn and apply problem-solving skills to current circumstances in order to maintain  positive mood states.  5-Point Likert rating baseline date: 06/19/2021 Target Date Goal Was reviewed Status Code Progress towards goal/Likert rating  06/20/2022 06/19/2021         O 4/5 - Pt has learned and is implementing p/s skills that support positive mood states             Goal 2) Develop healthy interpersonal relationships that lead to the alleviation and help prevent  the relapse of depression.  5-Point Likert rating baseline date: 06/19/2021 Target Date Goal Was reviewed Status Code 3/5 - Progress towards goal/Likert rating  06/20/2022 06/19/2021          O Pt has learned interpersonal skills that supporting positive mood states             Goal 3) Learn and apply coping skills to current circumstances in order to maintain positive mood  states.  5-Point Likert rating baseline date: 06/19/2021 Target Date Goal Was reviewed Status Code Progress towards goal/Likert rating  06/20/2022 06/19/2021            O 2/5 - Pt  This plan has been reviewed and created by the following participants:  This plan will be reviewed  at least every 12 months. Date Behavioral Health Clinician Date Guardian/Patient   06/19/2021 Lynn Gonzales, Ph.D.  06/19/2021 Lynn Gonzales                    Diagnosis F33.41 Major Depressive Disorder, recurrent, in remission F41.1  Generalized Anxiety Disorder   In session today, I met with Lynn Gonzales and her husband Lynn Gonzales.  We d/e/p how they were able to manage this week.  They each shared that things started out well, but by the time the work week rolled around, Lynn Gonzales stopped completing his household responsibilities.  We d/e/p what occurred, what his self talk was like and identified a number of obstacles.  I then conducted some motivational interviewing to help Lynn Gonzales find the motivation he requires to push himself a bit and challenge his self limiting self talk.  Lynn Gonzales was able to provide an alternative perspective and support [place].  I gave them a values exercise to complete which dovetailed with the motivational interview I conducted.  Home Practice:   Lynn Crochet, PhD

## 2022-05-13 ENCOUNTER — Ambulatory Visit (INDEPENDENT_AMBULATORY_CARE_PROVIDER_SITE_OTHER): Payer: BC Managed Care – PPO | Admitting: Psychology

## 2022-05-13 DIAGNOSIS — F411 Generalized anxiety disorder: Secondary | ICD-10-CM

## 2022-05-13 NOTE — Progress Notes (Signed)
PROGRESS NOTE:   Name: Lynn Gonzales Date: 05/13/2022 MRN: [id number] DOB: 21-Oct-1995 PCP: Pcp, No  Start Time: 11:00 AM Stop Time: 11:55 AM   Note: Annual Review 06-20-2022   Today I met with  Lynn Gonzales and her husband Lynn Gonzales in remote video (WebEx) face-to-face individual psychotherapy.     Distance Site: Client's Home Orginating Site: Dr. Jannifer Franklin Remote Office Consent: Obtained verbal consent to transmit  session remotely         Individualized Treatment Plan                Strengths: Bright, verbal, motivated and resourceful  Supports:  spouse and family   Goal/Needs for Treatment:  In order of importance to patient 1) Encourage the patient to use assertiveness skills and boundary setting application to the client's daily life.   2) Develop healthy interpersonal relationships that lead to the alleviation and help prevent the relapse of depression.  3) Balance life activities between consideration of others and development of own interests.    Client Statement of Needs: requires support, guidance and skills to manage emotional, family and work struggles as well as for life transitions.   Treatment Level: Outpatient Individual Psychotherapy  Symptoms:  Decrease or loss of appetite.-- Skipping meals due to loss of appetite, or busy schedule  Depressed or irritable mood.-- Sad affect most of the time, easily losses patience  Diminished interest in or enjoyment of activities. -- Difficulties with motivation for normal everyday activities  Feelings of hopelessness, worthlessness, or inappropriate guilt.-- Familiy pressures making her feel guilty, disappointed in self  History of chronic or recurrent depression for which the client has taken antidepressant medication, had outpatient treatment.  Lack of energy.-- frequent fatigue  Low self-esteem. Poor concentration and indecisiveness.   Client Treatment Preferences: to continue with present therapist    Healthcare consumer's goal for treatment:  Psychologist, [name], Ph.D.,  will support the patient's ability to achieve the goals identified. Cognitive Behavioral Therapy, Dialectical Behavioral Therapy, Motivational Interviewing, parent training, and other evidenced-based practices will be used to promote progress towards healthy functioning.   Healthcare consumer Lynn Gonzales will: Actively participate in therapy, working towards healthy functioning.    *Justification for Continuation/Discontinuation of Goal: R=Revised, O=Ongoing, A=Achieved, D=Discontinued  Goal 1) Learn and apply problem-solving skills to current circumstances in order to maintain  positive mood states.  5-Point Likert rating baseline date: 06/19/2021 Target Date Goal Was reviewed Status Code Progress towards goal/Likert rating  06/20/2022 06/19/2021         O 4/5 - Pt has learned and is implementing p/s skills that support positive mood states             Goal 2) Develop healthy interpersonal relationships that lead to the alleviation and help prevent  the relapse of depression.  5-Point Likert rating baseline date: 06/19/2021 Target Date Goal Was reviewed Status Code 3/5 - Progress towards goal/Likert rating  06/20/2022 06/19/2021          O Pt has learned interpersonal skills that supporting positive mood states             Goal 3) Learn and apply coping skills to current circumstances in order to maintain positive mood  states.  5-Point Likert rating baseline date: 06/19/2021 Target Date Goal Was reviewed Status Code Progress towards goal/Likert rating  06/20/2022 06/19/2021            O 2/5 - Pt  This plan has been reviewed and created by the following participants:  This plan will be reviewed  at least every 12 months. Date Behavioral Health Clinician Date Guardian/Patient   06/19/2021 [name], Ph.D.  06/19/2021 Lynn Gonzales                   Diagnosis: Major Depressive Disorder, recurrent, in remission Generalized Anxiety Disorder   [name] reports that things got stressful with her sisters.  We d/e/p what occurred, where things got off track, family dynamics and understanding mental illness.     Home Practice:   [name], PhD

## 2022-05-20 ENCOUNTER — Ambulatory Visit (INDEPENDENT_AMBULATORY_CARE_PROVIDER_SITE_OTHER): Payer: BC Managed Care – PPO | Admitting: Psychology

## 2022-05-20 DIAGNOSIS — F411 Generalized anxiety disorder: Secondary | ICD-10-CM | POA: Diagnosis not present

## 2022-05-20 NOTE — Progress Notes (Signed)
PROGRESS NOTE:   Name: Lynn Gonzales Date: 05/20/2022 MRN: [id number] DOB: Jul 30, 1995 PCP: Pcp, No  Start Time: 11:00 AM Stop Time: 11:55 AM   Note: Annual Review 06-20-2022   Today I met with Lynn Gonzales in remote video (WebEx) face-to-face individual psychotherapy.     Distance Site: Client's Home Orginating Site: Dr. Jannifer Gonzales Remote Office Consent: Obtained verbal consent to transmit  session remotely         Individualized Treatment Plan                Strengths: Bright, verbal, motivated and resourceful  Supports:  spouse and family   Goal/Needs for Treatment:  In order of importance to patient 1) Encourage the patient to use assertiveness skills and boundary setting application to the client's daily life.   2) Develop healthy interpersonal relationships that lead to the alleviation and help prevent the relapse of depression.  3) Balance life activities between consideration of others and development of own interests.    Client Statement of Needs: requires support, guidance and skills to manage emotional, family and work struggles as well as for life transitions.   Treatment Level: Outpatient Individual Psychotherapy  Symptoms:  Decrease or loss of appetite.-- Skipping meals due to loss of appetite, or busy schedule  Depressed or irritable mood.-- Sad affect most of the time, easily losses patience  Diminished interest in or enjoyment of activities. -- Difficulties with motivation for normal everyday activities  Feelings of hopelessness, worthlessness, or inappropriate guilt.-- Familiy pressures making her feel guilty, disappointed in self  History of chronic or recurrent depression for which the client has taken antidepressant medication, had outpatient treatment.  Lack of energy.-- frequent fatigue  Low self-esteem. Poor concentration and indecisiveness.   Client Treatment Preferences: to continue with present therapist   Healthcare consumer's goal for  treatment:  Psychologist, Lynn Gonzales, Ph.D.,  will support the patient's ability to achieve the goals identified. Cognitive Behavioral Therapy, Dialectical Behavioral Therapy, Motivational Interviewing, parent training, and other evidenced-based practices will be used to promote progress towards healthy functioning.   Healthcare consumer Lynn Gonzales will: Actively participate in therapy, working towards healthy functioning.    *Justification for Continuation/Discontinuation of Goal: R=Revised, O=Ongoing, A=Achieved, D=Discontinued  Goal 1) Learn and apply problem-solving skills to current circumstances in order to maintain  positive mood states.  5-Point Likert rating baseline date: 06/19/2021 Target Date Goal Was reviewed Status Code Progress towards goal/Likert rating  06/20/2022 06/19/2021         O 4/5 - Pt has learned and is implementing p/s skills that support positive mood states             Goal 2) Develop healthy interpersonal relationships that lead to the alleviation and help prevent  the relapse of depression.  5-Point Likert rating baseline date: 06/19/2021 Target Date Goal Was reviewed Status Code 3/5 - Progress towards goal/Likert rating  06/20/2022 06/19/2021          O Pt has learned interpersonal skills that supporting positive mood states             Goal 3) Learn and apply coping skills to current circumstances in order to maintain positive mood  states.  5-Point Likert rating baseline date: 06/19/2021 Target Date Goal Was reviewed Status Code Progress towards goal/Likert rating  06/20/2022 06/19/2021            O 2/5 - Pt  This plan has been reviewed and created by the following participants:  This plan will be reviewed  at least every 12 months. Date Behavioral Health Clinician Date Guardian/Patient   06/19/2021 Lynn Gonzales, Ph.D.  06/19/2021 Lynn Gonzales                  Diagnosis: Major Depressive Disorder, recurrent,  in remission Generalized Anxiety Disorder   Lynn Gonzales reports that it was a very stressful week preparing for her Lynn Gonzales's surprise birthday party.  She shared all the difficulties she had with her sisters this past week.  We d/p where to seek other sources of support outside of her sister circle.  Lastly, she shared that Lynn Gonzales has made some significant improvements and that this has helped alleviate some stress.  We d/ how to stay positive and not negatively anticipate a return to past patterns but enjoy the present moment.   Home Practice:   Lynn Crochet, PhD  Notes:  Son: Lynn Gonzales Daughter: Lynn Gonzales

## 2022-05-27 ENCOUNTER — Ambulatory Visit (INDEPENDENT_AMBULATORY_CARE_PROVIDER_SITE_OTHER): Payer: BC Managed Care – PPO | Admitting: Psychology

## 2022-05-27 DIAGNOSIS — F330 Major depressive disorder, recurrent, mild: Secondary | ICD-10-CM | POA: Diagnosis not present

## 2022-05-27 NOTE — Progress Notes (Signed)
PROGRESS NOTE:   Name: Natassia Humes Date: 05/27/2022 MRN: [id number] DOB: 15-Nov-1995 PCP: Pcp, No  Start Time: 11:00 AM Stop Time: 11:55 AM   Note: Annual Review 06-20-2022   Today I met with [name] in remote video (Caregility) face-to-face individual psychotherapy.     Distance Site: Client's Home Orginating Site: Dr. Odette Horns Remote Office Consent: Obtained verbal consent to transmit session remotely         Individualized Treatment Plan                Strengths: Bright, verbal, motivated and resourceful  Supports:  spouse and family   Goal/Needs for Treatment:  In order of importance to patient 1) Encourage the patient to use assertiveness skills and boundary setting application to the client's daily life.   2) Develop healthy interpersonal relationships that lead to the alleviation and help prevent the relapse of depression.  3) Balance life activities between consideration of others and development of own interests.    Client Statement of Needs: requires support, guidance and skills to manage emotional, family and work struggles as well as for life transitions.   Treatment Level: Outpatient Individual Psychotherapy  Symptoms:  Decrease or loss of appetite.-- Skipping meals due to loss of appetite, or busy schedule  Depressed or irritable mood.-- Sad affect most of the time, easily losses patience  Diminished interest in or enjoyment of activities. -- Difficulties with motivation for normal everyday activities  Feelings of hopelessness, worthlessness, or inappropriate guilt.-- Familiy pressures making her feel guilty, disappointed in self  History of chronic or recurrent depression for which the client has taken antidepressant medication, had outpatient treatment.  Lack of energy.-- frequent fatigue  Low self-esteem. Poor concentration and indecisiveness.   Client Treatment Preferences: to continue with present therapist   Healthcare consumer's goal  for treatment:  Psychologist, Hilma Favors, Ph.D.,  will support the patient's ability to achieve the goals identified. Cognitive Behavioral Therapy, Dialectical Behavioral Therapy, Motivational Interviewing, parent training, and other evidenced-based practices will be used to promote progress towards healthy functioning.   Healthcare consumer Kenasia Brackens will: Actively participate in therapy, working towards healthy functioning.    *Justification for Continuation/Discontinuation of Goal: R=Revised, O=Ongoing, A=Achieved, D=Discontinued  Goal 1) Learn and apply problem-solving skills to current circumstances in order to maintain  positive mood states.  5-Point Likert rating baseline date: 06/19/2021 Target Date Goal Was reviewed Status Code Progress towards goal/Likert rating  06/20/2022 06/19/2021         O 4/5 - Pt has learned and is implementing p/s skills that support positive mood states             Goal 2) Develop healthy interpersonal relationships that lead to the alleviation and help prevent  the relapse of depression.  5-Point Likert rating baseline date: 06/19/2021 Target Date Goal Was reviewed Status Code 3/5 - Progress towards goal/Likert rating  06/20/2022 06/19/2021          O Pt has learned interpersonal skills that supporting positive mood states             Goal 3) Learn and apply coping skills to current circumstances in order to maintain positive mood  states.  5-Point Likert rating baseline date: 06/19/2021 Target Date Goal Was reviewed Status Code Progress towards goal/Likert rating  06/20/2022 06/19/2021            O 2/5 - Pt  This plan has been reviewed and created by the following participants:  This plan will be reviewed  at least every 12 months. Date Behavioral Health Clinician Date Guardian/Patient   06/19/2021 Hilma Favors, Ph.D.  06/19/2021 [name]                  Diagnosis: Major Depressive Disorder,  recurrent, in remission Generalized Anxiety Disorder   Shailene reports that it was a quieter week.  However, her mother has been difficult.  She shared her recent encounters that made me think her mother was depressed.  We d/p how her mother's negativity is hard for [employer] to not take personally.  We d/e ways for her to keep positive and better at recalling the good things she's accomplished.  Lastly, pt was informed of upcoming changes in virtual technology, of my vacation and confirmed his upcoming appointments.  Home Practice:    Hilma Favors, PhD  Notes:  Son: Dell Ponto Daughter: Krystal Clark

## 2022-05-31 ENCOUNTER — Ambulatory Visit (INDEPENDENT_AMBULATORY_CARE_PROVIDER_SITE_OTHER): Payer: BC Managed Care – PPO | Admitting: Psychology

## 2022-05-31 DIAGNOSIS — F3340 Major depressive disorder, recurrent, in remission, unspecified: Secondary | ICD-10-CM | POA: Diagnosis not present

## 2022-05-31 DIAGNOSIS — F411 Generalized anxiety disorder: Secondary | ICD-10-CM | POA: Diagnosis not present

## 2022-05-31 NOTE — Progress Notes (Signed)
PROGRESS NOTE:   Name: Lynn Gonzales Date: 05/31/2022 MRN: [id number] DOB: 04-29-1995 PCP: Pcp, No  Start Time: 10:00 AM Stop Time: 10:55 AM   Note: Annual Review 06-20-2022   Today I met with Lynn Gonzales in remote video (Caregility) face-to-face individual psychotherapy.     Distance Site: Client's Home Orginating Site: Dr. Odette Horns Remote Office Consent: Obtained verbal consent to transmit session remotely         Individualized Treatment Plan                Strengths: Bright, verbal, motivated and resourceful  Supports:  spouse and family   Goal/Needs for Treatment:  In order of importance to patient 1) Encourage the patient to use assertiveness skills and boundary setting application to the client's daily life.   2) Develop healthy interpersonal relationships that lead to the alleviation and help prevent the relapse of depression.  3) Balance life activities between consideration of others and development of own interests.    Client Statement of Needs: requires support, guidance and skills to manage emotional, family and work struggles as well as for life transitions.   Treatment Level: Outpatient Individual Psychotherapy  Symptoms:  Decrease or loss of appetite.-- Skipping meals due to loss of appetite, or busy schedule  Depressed or irritable mood.-- Sad affect most of the time, easily losses patience  Diminished interest in or enjoyment of activities. -- Difficulties with motivation for normal everyday activities  Feelings of hopelessness, worthlessness, or inappropriate guilt.-- Familiy pressures making her feel guilty, disappointed in self  History of chronic or recurrent depression for which the client has taken antidepressant medication, had outpatient treatment.  Lack of energy.-- frequent fatigue  Low self-esteem. Poor concentration and indecisiveness.   Client Treatment Preferences: to continue with present therapist   Healthcare consumer's goal  for treatment:  Psychologist, Lynn Gonzales, Ph.D.,  will support the patient's ability to achieve the goals identified. Cognitive Behavioral Therapy, Dialectical Behavioral Therapy, Motivational Interviewing, parent training, and other evidenced-based practices will be used to promote progress towards healthy functioning.   Healthcare consumer Lynn Gonzales will: Actively participate in therapy, working towards healthy functioning.    *Justification for Continuation/Discontinuation of Goal: R=Revised, O=Ongoing, A=Achieved, D=Discontinued  Goal 1) Learn and apply problem-solving skills to current circumstances in order to maintain  positive mood states.  5-Point Likert rating baseline date: 06/19/2021 Target Date Goal Was reviewed Status Code Progress towards goal/Likert rating  06/20/2022 06/19/2021         O 4/5 - Pt has learned and is implementing p/s skills that support positive mood states             Goal 2) Develop healthy interpersonal relationships that lead to the alleviation and help prevent  the relapse of depression.  5-Point Likert rating baseline date: 06/19/2021 Target Date Goal Was reviewed Status Code 3/5 - Progress towards goal/Likert rating  06/20/2022 06/19/2021          O Pt has learned interpersonal skills that supporting positive mood states             Goal 3) Learn and apply coping skills to current circumstances in order to maintain positive mood  states.  5-Point Likert rating baseline date: 06/19/2021 Target Date Goal Was reviewed Status Code Progress towards goal/Likert rating  06/20/2022 06/19/2021            O 2/5 - Pt  This plan has been reviewed and created by the following participants:  This plan will be reviewed  at least every 12 months. Date Behavioral Health Clinician Date Guardian/Patient   06/19/2021 Lynn Gonzales, Ph.D.  06/19/2021 Lynn Gonzales                  Diagnosis: Major Depressive Disorder,  recurrent, in remission Generalized Anxiety Disorder   Lynn Gonzales reports that it was a quieter week.  However, her mother has been difficult.  She shared her recent encounters that made me think her mother was depressed.  We d/p how her mother's negativity is hard for Lynn Gonzales to not take personally.  We d/e ways for her to keep positive and better at recalling the good things she's accomplished.  Lastly, pt was informed of upcoming changes in virtual technology, of my vacation and confirmed his upcoming appointments.  Home Practice:    Lynn Favors, PhD  PROGRESS NOTE:   Name: Lynn Gonzales Date: 05/31/2022 MRN: [id number] DOB: 1995-07-29 PCP: Pcp, No  Start Time: 11:00 AM Stop Time: 11:55 AM   Note: Annual Review 06-20-2022   Today I met with Lynn Gonzales in remote video (Caregility) face-to-face individual psychotherapy.     Distance Site: Client's Home Orginating Site: Dr. Odette Horns Remote Office Consent: Obtained verbal consent to transmit session remotely         Individualized Treatment Plan                Strengths: Bright, verbal, motivated and resourceful  Supports:  spouse and family   Goal/Needs for Treatment:  In order of importance to patient 1) Encourage the patient to use assertiveness skills and boundary setting application to the client's daily life.   2) Develop healthy interpersonal relationships that lead to the alleviation and help prevent the relapse of depression.  3) Balance life activities between consideration of others and development of own interests.    Client Statement of Needs: requires support, guidance and skills to manage emotional, family and work struggles as well as for life transitions.   Treatment Level: Outpatient Individual Psychotherapy  Symptoms:  Decrease or loss of appetite.-- Skipping meals due to loss of appetite, or busy schedule  Depressed or irritable mood.-- Sad affect most of the time, easily losses patience   Diminished interest in or enjoyment of activities. -- Difficulties with motivation for normal everyday activities  Feelings of hopelessness, worthlessness, or inappropriate guilt.-- Familiy pressures making her feel guilty, disappointed in self  History of chronic or recurrent depression for which the client has taken antidepressant medication, had outpatient treatment.  Lack of energy.-- frequent fatigue  Low self-esteem. Poor concentration and indecisiveness.   Client Treatment Preferences: to continue with present therapist   Healthcare consumer's goal for treatment:  Psychologist, Lynn Gonzales, Ph.D.,  will support the patient's ability to achieve the goals identified. Cognitive Behavioral Therapy, Dialectical Behavioral Therapy, Motivational Interviewing, parent training, and other evidenced-based practices will be used to promote progress towards healthy functioning.   Healthcare consumer Lynn Gonzales will: Actively participate in therapy, working towards healthy functioning.    *Justification for Continuation/Discontinuation of Goal: R=Revised, O=Ongoing, A=Achieved, D=Discontinued  Goal 1) Learn and apply problem-solving skills to current circumstances in order to maintain  positive mood states.  5-Point Likert rating baseline date: 06/19/2021 Target Date Goal Was reviewed Status Code Progress towards goal/Likert rating  06/20/2022 06/19/2021         O 4/5 - Pt has learned and is implementing  p/s skills that support positive mood states             Goal 2) Develop healthy interpersonal relationships that lead to the alleviation and help prevent  the relapse of depression.  5-Point Likert rating baseline date: 06/19/2021 Target Date Goal Was reviewed Status Code 3/5 - Progress towards goal/Likert rating  06/20/2022 06/19/2021          O Pt has learned interpersonal skills that supporting positive mood states             Goal 3) Learn and apply coping skills to current  circumstances in order to maintain positive mood  states.  5-Point Likert rating baseline date: 06/19/2021 Target Date Goal Was reviewed Status Code Progress towards goal/Likert rating  06/20/2022 06/19/2021            O 2/5 - Pt                This plan has been reviewed and created by the following participants:  This plan will be reviewed  at least every 12 months. Date Behavioral Health Clinician Date Guardian/Patient   06/19/2021 Lynn Gonzales, Ph.D.  06/19/2021 Lynn Gonzales                  Diagnosis: Major Depressive Disorder, recurrent, in remission Generalized Anxiety Disorder   Lynn Gonzales reports that it things have been calmer this week.  However, her husband c/o that she "doesn't ever just rest or relax."  We d/e/p that she is feeling anxious about figuring out "what's next," and feeling like she is fearful of "being a failure."  I noted that some of what she is internalizing is her mother's voice and concerns.  I noted that she has too much time to think right now and that she needed to be deliberate about allowing/alotting herself time to rest and to think (separately).  I made some inquires to have her realize that maintaining the staus quo for a time is perfectly fine.  She is in a state of transition but there is no rush to move into the next things.  Lynn Gonzales has said herself that she feels content to be at home with the baby and her family.  I further encouraged her to monitor her anxious thoughts and to challenge them as is necessary to manage her anxiety.  Lastly, pt was informed of my vacation and confirmed his upcoming appointments.  Home Practice: calming exercises, bilateral stimulation soundtrack    Lynn Favors, PhD  Notes:  Son: Lynn Gonzales Daughter: Lynn Gonzales

## 2022-06-03 ENCOUNTER — Ambulatory Visit: Payer: BC Managed Care – PPO | Admitting: Psychology

## 2022-06-10 ENCOUNTER — Ambulatory Visit: Payer: BC Managed Care – PPO | Admitting: Psychology

## 2022-06-17 ENCOUNTER — Ambulatory Visit: Payer: BC Managed Care – PPO | Admitting: Psychology

## 2022-06-24 ENCOUNTER — Ambulatory Visit (INDEPENDENT_AMBULATORY_CARE_PROVIDER_SITE_OTHER): Payer: BC Managed Care – PPO | Admitting: Psychology

## 2022-06-24 DIAGNOSIS — F411 Generalized anxiety disorder: Secondary | ICD-10-CM | POA: Diagnosis not present
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
## 2022-07-01 ENCOUNTER — Ambulatory Visit (INDEPENDENT_AMBULATORY_CARE_PROVIDER_SITE_OTHER): Payer: BC Managed Care – PPO | Admitting: Psychology

## 2022-07-01 DIAGNOSIS — F330 Major depressive disorder, recurrent, mild: Secondary | ICD-10-CM
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
## 2022-07-08 ENCOUNTER — Ambulatory Visit: Payer: BC Managed Care – PPO | Admitting: Psychology

## 2022-07-15 ENCOUNTER — Ambulatory Visit (INDEPENDENT_AMBULATORY_CARE_PROVIDER_SITE_OTHER): Payer: BC Managed Care – PPO | Admitting: Psychology

## 2022-07-15 DIAGNOSIS — F330 Major depressive disorder, recurrent, mild: Secondary | ICD-10-CM | POA: Diagnosis not present
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
## 2022-07-22 ENCOUNTER — Ambulatory Visit (INDEPENDENT_AMBULATORY_CARE_PROVIDER_SITE_OTHER): Payer: BC Managed Care – PPO | Admitting: Psychology

## 2022-07-22 DIAGNOSIS — F411 Generalized anxiety disorder: Secondary | ICD-10-CM

## 2022-07-22 DIAGNOSIS — F3340 Major depressive disorder, recurrent, in remission, unspecified: Secondary | ICD-10-CM | POA: Diagnosis not present
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
## 2022-07-29 ENCOUNTER — Ambulatory Visit (INDEPENDENT_AMBULATORY_CARE_PROVIDER_SITE_OTHER): Payer: BC Managed Care – PPO | Admitting: Psychology

## 2022-07-29 DIAGNOSIS — F330 Major depressive disorder, recurrent, mild: Secondary | ICD-10-CM
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
## 2022-08-05 ENCOUNTER — Ambulatory Visit (INDEPENDENT_AMBULATORY_CARE_PROVIDER_SITE_OTHER): Payer: BC Managed Care – PPO | Admitting: Psychology

## 2022-08-05 DIAGNOSIS — F411 Generalized anxiety disorder: Secondary | ICD-10-CM | POA: Diagnosis not present

## 2022-08-05 DIAGNOSIS — F330 Major depressive disorder, recurrent, mild: Secondary | ICD-10-CM | POA: Diagnosis not present
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
## 2022-08-12 ENCOUNTER — Ambulatory Visit: Payer: BC Managed Care – PPO | Admitting: Psychology

## 2022-08-26 ENCOUNTER — Ambulatory Visit: Payer: BC Managed Care – PPO | Admitting: Psychology

## 2022-08-31 ENCOUNTER — Ambulatory Visit: Payer: BC Managed Care – PPO | Admitting: Psychology

## 2022-08-31 DIAGNOSIS — F411 Generalized anxiety disorder: Secondary | ICD-10-CM | POA: Diagnosis not present

## 2022-08-31 DIAGNOSIS — F3340 Major depressive disorder, recurrent, in remission, unspecified: Secondary | ICD-10-CM

## 2022-08-31 DIAGNOSIS — F330 Major depressive disorder, recurrent, mild: Secondary | ICD-10-CM
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
## 2022-09-02 ENCOUNTER — Ambulatory Visit: Payer: BC Managed Care – PPO | Admitting: Psychology

## 2022-09-09 ENCOUNTER — Ambulatory Visit (INDEPENDENT_AMBULATORY_CARE_PROVIDER_SITE_OTHER): Payer: BC Managed Care – PPO | Admitting: Psychology

## 2022-09-09 DIAGNOSIS — F330 Major depressive disorder, recurrent, mild: Secondary | ICD-10-CM

## 2022-09-09 DIAGNOSIS — F411 Generalized anxiety disorder: Secondary | ICD-10-CM
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
## 2022-09-16 ENCOUNTER — Ambulatory Visit (INDEPENDENT_AMBULATORY_CARE_PROVIDER_SITE_OTHER): Payer: BC Managed Care – PPO | Admitting: Psychology

## 2022-09-16 DIAGNOSIS — F3340 Major depressive disorder, recurrent, in remission, unspecified: Secondary | ICD-10-CM

## 2022-09-16 DIAGNOSIS — F411 Generalized anxiety disorder: Secondary | ICD-10-CM
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
## 2022-09-23 ENCOUNTER — Ambulatory Visit (INDEPENDENT_AMBULATORY_CARE_PROVIDER_SITE_OTHER): Payer: BC Managed Care – PPO | Admitting: Psychology

## 2022-09-23 DIAGNOSIS — F411 Generalized anxiety disorder: Secondary | ICD-10-CM

## 2022-09-23 DIAGNOSIS — F3340 Major depressive disorder, recurrent, in remission, unspecified: Secondary | ICD-10-CM

## 2022-09-23 DIAGNOSIS — F330 Major depressive disorder, recurrent, mild: Secondary | ICD-10-CM
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
## 2022-09-30 ENCOUNTER — Ambulatory Visit (INDEPENDENT_AMBULATORY_CARE_PROVIDER_SITE_OTHER): Payer: BC Managed Care – PPO | Admitting: Psychology

## 2022-09-30 DIAGNOSIS — F3340 Major depressive disorder, recurrent, in remission, unspecified: Secondary | ICD-10-CM

## 2022-09-30 DIAGNOSIS — F411 Generalized anxiety disorder: Secondary | ICD-10-CM
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
## 2022-10-07 ENCOUNTER — Ambulatory Visit (INDEPENDENT_AMBULATORY_CARE_PROVIDER_SITE_OTHER): Payer: BC Managed Care – PPO | Admitting: Psychology

## 2022-10-07 DIAGNOSIS — F3342 Major depressive disorder, recurrent, in full remission: Secondary | ICD-10-CM

## 2022-10-07 DIAGNOSIS — F411 Generalized anxiety disorder: Secondary | ICD-10-CM
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
## 2022-10-14 ENCOUNTER — Ambulatory Visit: Payer: BC Managed Care – PPO | Admitting: Psychology

## 2022-10-21 ENCOUNTER — Ambulatory Visit (INDEPENDENT_AMBULATORY_CARE_PROVIDER_SITE_OTHER): Payer: BC Managed Care – PPO | Admitting: Psychology

## 2022-10-21 DIAGNOSIS — F3340 Major depressive disorder, recurrent, in remission, unspecified: Secondary | ICD-10-CM | POA: Diagnosis not present

## 2022-10-21 DIAGNOSIS — F411 Generalized anxiety disorder: Secondary | ICD-10-CM

## 2022-10-21 DIAGNOSIS — F330 Major depressive disorder, recurrent, mild: Secondary | ICD-10-CM
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
## 2022-10-28 ENCOUNTER — Ambulatory Visit (INDEPENDENT_AMBULATORY_CARE_PROVIDER_SITE_OTHER): Payer: BC Managed Care – PPO | Admitting: Psychology

## 2022-10-28 DIAGNOSIS — F411 Generalized anxiety disorder: Secondary | ICD-10-CM | POA: Diagnosis not present

## 2022-10-28 DIAGNOSIS — F3340 Major depressive disorder, recurrent, in remission, unspecified: Secondary | ICD-10-CM

## 2022-10-28 DIAGNOSIS — F330 Major depressive disorder, recurrent, mild: Secondary | ICD-10-CM
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
## 2022-11-04 ENCOUNTER — Ambulatory Visit (INDEPENDENT_AMBULATORY_CARE_PROVIDER_SITE_OTHER): Payer: BC Managed Care – PPO | Admitting: Psychology

## 2022-11-04 DIAGNOSIS — F411 Generalized anxiety disorder: Secondary | ICD-10-CM

## 2022-11-04 DIAGNOSIS — F3340 Major depressive disorder, recurrent, in remission, unspecified: Secondary | ICD-10-CM

## 2022-11-04 DIAGNOSIS — F330 Major depressive disorder, recurrent, mild: Secondary | ICD-10-CM
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
## 2022-11-08 ENCOUNTER — Ambulatory Visit (INDEPENDENT_AMBULATORY_CARE_PROVIDER_SITE_OTHER): Payer: BC Managed Care – PPO | Admitting: Psychology

## 2022-11-08 DIAGNOSIS — F330 Major depressive disorder, recurrent, mild: Secondary | ICD-10-CM

## 2022-11-08 DIAGNOSIS — F411 Generalized anxiety disorder: Secondary | ICD-10-CM
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
## 2022-11-11 ENCOUNTER — Ambulatory Visit: Payer: BC Managed Care – PPO | Admitting: Psychology

## 2022-11-11 DIAGNOSIS — F330 Major depressive disorder, recurrent, mild: Secondary | ICD-10-CM

## 2022-11-11 DIAGNOSIS — F411 Generalized anxiety disorder: Secondary | ICD-10-CM

## 2022-11-11 DIAGNOSIS — F3340 Major depressive disorder, recurrent, in remission, unspecified: Secondary | ICD-10-CM
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
## 2022-11-15 ENCOUNTER — Ambulatory Visit (INDEPENDENT_AMBULATORY_CARE_PROVIDER_SITE_OTHER): Payer: BC Managed Care – PPO | Admitting: Psychology

## 2022-11-15 DIAGNOSIS — F330 Major depressive disorder, recurrent, mild: Secondary | ICD-10-CM

## 2022-11-15 DIAGNOSIS — F3340 Major depressive disorder, recurrent, in remission, unspecified: Secondary | ICD-10-CM | POA: Diagnosis not present

## 2022-11-15 DIAGNOSIS — F411 Generalized anxiety disorder: Secondary | ICD-10-CM
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
## 2022-11-18 ENCOUNTER — Ambulatory Visit: Payer: BC Managed Care – PPO | Admitting: Psychology

## 2022-11-18 DIAGNOSIS — F330 Major depressive disorder, recurrent, mild: Secondary | ICD-10-CM

## 2022-11-18 DIAGNOSIS — F411 Generalized anxiety disorder: Secondary | ICD-10-CM

## 2022-11-18 DIAGNOSIS — F3340 Major depressive disorder, recurrent, in remission, unspecified: Secondary | ICD-10-CM
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
## 2022-11-22 ENCOUNTER — Ambulatory Visit (INDEPENDENT_AMBULATORY_CARE_PROVIDER_SITE_OTHER): Payer: BC Managed Care – PPO | Admitting: Psychology

## 2022-11-22 DIAGNOSIS — F3340 Major depressive disorder, recurrent, in remission, unspecified: Secondary | ICD-10-CM

## 2022-11-22 DIAGNOSIS — F330 Major depressive disorder, recurrent, mild: Secondary | ICD-10-CM
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
## 2022-11-25 ENCOUNTER — Ambulatory Visit: Payer: BC Managed Care – PPO | Admitting: Psychology

## 2022-11-25 DIAGNOSIS — F330 Major depressive disorder, recurrent, mild: Secondary | ICD-10-CM

## 2022-11-25 DIAGNOSIS — F3340 Major depressive disorder, recurrent, in remission, unspecified: Secondary | ICD-10-CM | POA: Diagnosis not present

## 2022-11-25 DIAGNOSIS — F411 Generalized anxiety disorder: Secondary | ICD-10-CM
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
## 2022-12-02 ENCOUNTER — Ambulatory Visit: Payer: BC Managed Care – PPO | Admitting: Psychology

## 2022-12-02 DIAGNOSIS — F330 Major depressive disorder, recurrent, mild: Secondary | ICD-10-CM

## 2022-12-02 DIAGNOSIS — F411 Generalized anxiety disorder: Secondary | ICD-10-CM | POA: Diagnosis not present

## 2022-12-02 DIAGNOSIS — F3342 Major depressive disorder, recurrent, in full remission: Secondary | ICD-10-CM
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
## 2022-12-09 ENCOUNTER — Ambulatory Visit: Payer: BC Managed Care – PPO | Admitting: Psychology

## 2022-12-09 DIAGNOSIS — F411 Generalized anxiety disorder: Secondary | ICD-10-CM | POA: Diagnosis not present

## 2022-12-09 DIAGNOSIS — F330 Major depressive disorder, recurrent, mild: Secondary | ICD-10-CM

## 2022-12-09 DIAGNOSIS — F331 Major depressive disorder, recurrent, moderate: Secondary | ICD-10-CM
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
## 2022-12-16 ENCOUNTER — Ambulatory Visit: Payer: BC Managed Care – PPO | Admitting: Psychology

## 2022-12-16 DIAGNOSIS — F411 Generalized anxiety disorder: Secondary | ICD-10-CM

## 2022-12-16 DIAGNOSIS — F330 Major depressive disorder, recurrent, mild: Secondary | ICD-10-CM

## 2022-12-16 DIAGNOSIS — F3340 Major depressive disorder, recurrent, in remission, unspecified: Secondary | ICD-10-CM | POA: Diagnosis not present
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
## 2022-12-23 ENCOUNTER — Ambulatory Visit: Payer: BC Managed Care – PPO | Admitting: Psychology

## 2022-12-23 DIAGNOSIS — F411 Generalized anxiety disorder: Secondary | ICD-10-CM

## 2022-12-23 DIAGNOSIS — F3340 Major depressive disorder, recurrent, in remission, unspecified: Secondary | ICD-10-CM | POA: Diagnosis not present
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
## 2023-01-06 ENCOUNTER — Ambulatory Visit: Payer: BC Managed Care – PPO | Admitting: Psychology

## 2023-01-06 DIAGNOSIS — F411 Generalized anxiety disorder: Secondary | ICD-10-CM

## 2023-01-06 DIAGNOSIS — F3340 Major depressive disorder, recurrent, in remission, unspecified: Secondary | ICD-10-CM | POA: Diagnosis not present
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
## 2023-01-17 ENCOUNTER — Ambulatory Visit (INDEPENDENT_AMBULATORY_CARE_PROVIDER_SITE_OTHER): Payer: BC Managed Care – PPO | Admitting: Psychology

## 2023-01-17 DIAGNOSIS — F3340 Major depressive disorder, recurrent, in remission, unspecified: Secondary | ICD-10-CM | POA: Diagnosis not present

## 2023-01-17 DIAGNOSIS — F411 Generalized anxiety disorder: Secondary | ICD-10-CM
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
## 2023-01-20 ENCOUNTER — Ambulatory Visit: Payer: BC Managed Care – PPO | Admitting: Psychology

## 2023-01-20 DIAGNOSIS — F411 Generalized anxiety disorder: Secondary | ICD-10-CM | POA: Diagnosis not present

## 2023-01-20 DIAGNOSIS — F3340 Major depressive disorder, recurrent, in remission, unspecified: Secondary | ICD-10-CM | POA: Diagnosis not present
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
## 2023-01-25 ENCOUNTER — Ambulatory Visit: Payer: BC Managed Care – PPO | Admitting: Psychology

## 2023-01-25 DIAGNOSIS — F3340 Major depressive disorder, recurrent, in remission, unspecified: Secondary | ICD-10-CM

## 2023-01-25 DIAGNOSIS — F411 Generalized anxiety disorder: Secondary | ICD-10-CM | POA: Diagnosis not present
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
## 2023-01-27 ENCOUNTER — Ambulatory Visit: Payer: BC Managed Care – PPO | Admitting: Psychology

## 2023-02-02 ENCOUNTER — Ambulatory Visit: Payer: BC Managed Care – PPO | Admitting: Psychology

## 2023-02-02 DIAGNOSIS — F3340 Major depressive disorder, recurrent, in remission, unspecified: Secondary | ICD-10-CM | POA: Diagnosis not present

## 2023-02-02 DIAGNOSIS — F411 Generalized anxiety disorder: Secondary | ICD-10-CM | POA: Diagnosis not present
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
## 2023-02-03 ENCOUNTER — Ambulatory Visit: Payer: BC Managed Care – PPO | Admitting: Psychology

## 2023-02-03 DIAGNOSIS — F3340 Major depressive disorder, recurrent, in remission, unspecified: Secondary | ICD-10-CM

## 2023-02-03 DIAGNOSIS — F411 Generalized anxiety disorder: Secondary | ICD-10-CM | POA: Diagnosis not present
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
## 2023-02-10 ENCOUNTER — Ambulatory Visit: Payer: BC Managed Care – PPO | Admitting: Psychology

## 2023-02-17 ENCOUNTER — Ambulatory Visit: Payer: BC Managed Care – PPO | Admitting: Psychology

## 2023-02-23 ENCOUNTER — Ambulatory Visit (INDEPENDENT_AMBULATORY_CARE_PROVIDER_SITE_OTHER): Payer: BC Managed Care – PPO | Admitting: Psychology

## 2023-02-23 DIAGNOSIS — F3340 Major depressive disorder, recurrent, in remission, unspecified: Secondary | ICD-10-CM

## 2023-02-23 DIAGNOSIS — F411 Generalized anxiety disorder: Secondary | ICD-10-CM | POA: Diagnosis not present
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
## 2023-02-24 ENCOUNTER — Ambulatory Visit (INDEPENDENT_AMBULATORY_CARE_PROVIDER_SITE_OTHER): Payer: BC Managed Care – PPO | Admitting: Psychology

## 2023-02-24 DIAGNOSIS — F3340 Major depressive disorder, recurrent, in remission, unspecified: Secondary | ICD-10-CM

## 2023-02-24 DIAGNOSIS — F411 Generalized anxiety disorder: Secondary | ICD-10-CM
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
## 2023-02-28 ENCOUNTER — Ambulatory Visit (INDEPENDENT_AMBULATORY_CARE_PROVIDER_SITE_OTHER): Payer: BC Managed Care – PPO | Admitting: Psychology

## 2023-02-28 DIAGNOSIS — F3340 Major depressive disorder, recurrent, in remission, unspecified: Secondary | ICD-10-CM

## 2023-02-28 DIAGNOSIS — F411 Generalized anxiety disorder: Secondary | ICD-10-CM
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
## 2023-03-03 ENCOUNTER — Ambulatory Visit: Payer: BC Managed Care – PPO | Admitting: Psychology

## 2023-03-03 DIAGNOSIS — F3341 Major depressive disorder, recurrent, in partial remission: Secondary | ICD-10-CM

## 2023-03-03 DIAGNOSIS — F411 Generalized anxiety disorder: Secondary | ICD-10-CM
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
## 2023-03-10 ENCOUNTER — Ambulatory Visit: Payer: BC Managed Care – PPO | Admitting: Psychology

## 2023-03-10 DIAGNOSIS — F3340 Major depressive disorder, recurrent, in remission, unspecified: Secondary | ICD-10-CM | POA: Diagnosis not present
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
## 2023-03-17 ENCOUNTER — Ambulatory Visit: Payer: BC Managed Care – PPO | Admitting: Psychology

## 2023-03-17 DIAGNOSIS — F3340 Major depressive disorder, recurrent, in remission, unspecified: Secondary | ICD-10-CM

## 2023-03-17 DIAGNOSIS — F411 Generalized anxiety disorder: Secondary | ICD-10-CM | POA: Diagnosis not present
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
## 2023-03-24 ENCOUNTER — Ambulatory Visit: Payer: BC Managed Care – PPO | Admitting: Psychology

## 2023-03-28 ENCOUNTER — Ambulatory Visit: Payer: BC Managed Care – PPO | Admitting: Psychology

## 2023-03-28 DIAGNOSIS — F3340 Major depressive disorder, recurrent, in remission, unspecified: Secondary | ICD-10-CM | POA: Diagnosis not present

## 2023-03-28 DIAGNOSIS — F411 Generalized anxiety disorder: Secondary | ICD-10-CM
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
## 2023-03-31 ENCOUNTER — Ambulatory Visit: Payer: BC Managed Care – PPO | Admitting: Psychology

## 2023-03-31 DIAGNOSIS — F411 Generalized anxiety disorder: Secondary | ICD-10-CM | POA: Diagnosis not present

## 2023-03-31 DIAGNOSIS — F3340 Major depressive disorder, recurrent, in remission, unspecified: Secondary | ICD-10-CM
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
## 2023-04-07 ENCOUNTER — Ambulatory Visit: Payer: BC Managed Care – PPO | Admitting: Psychology

## 2023-04-07 DIAGNOSIS — F3340 Major depressive disorder, recurrent, in remission, unspecified: Secondary | ICD-10-CM

## 2023-04-07 DIAGNOSIS — F411 Generalized anxiety disorder: Secondary | ICD-10-CM | POA: Diagnosis not present

## 2023-04-07 DIAGNOSIS — F330 Major depressive disorder, recurrent, mild: Secondary | ICD-10-CM
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
## 2023-04-11 ENCOUNTER — Ambulatory Visit (INDEPENDENT_AMBULATORY_CARE_PROVIDER_SITE_OTHER): Payer: BC Managed Care – PPO | Admitting: Psychology

## 2023-04-11 DIAGNOSIS — F411 Generalized anxiety disorder: Secondary | ICD-10-CM | POA: Diagnosis not present

## 2023-04-11 DIAGNOSIS — F3340 Major depressive disorder, recurrent, in remission, unspecified: Secondary | ICD-10-CM
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
## 2023-04-13 ENCOUNTER — Ambulatory Visit: Payer: BC Managed Care – PPO | Admitting: Psychology

## 2023-04-13 DIAGNOSIS — F411 Generalized anxiety disorder: Secondary | ICD-10-CM

## 2023-04-13 DIAGNOSIS — F3340 Major depressive disorder, recurrent, in remission, unspecified: Secondary | ICD-10-CM | POA: Diagnosis not present
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
## 2023-04-14 ENCOUNTER — Ambulatory Visit: Payer: BC Managed Care – PPO | Admitting: Psychology

## 2023-04-21 ENCOUNTER — Ambulatory Visit: Payer: BC Managed Care – PPO | Admitting: Psychology

## 2023-04-21 DIAGNOSIS — F3340 Major depressive disorder, recurrent, in remission, unspecified: Secondary | ICD-10-CM | POA: Diagnosis not present

## 2023-04-21 DIAGNOSIS — F411 Generalized anxiety disorder: Secondary | ICD-10-CM | POA: Diagnosis not present
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
## 2023-04-28 ENCOUNTER — Ambulatory Visit (INDEPENDENT_AMBULATORY_CARE_PROVIDER_SITE_OTHER): Payer: BC Managed Care – PPO | Admitting: Psychology

## 2023-04-28 DIAGNOSIS — F411 Generalized anxiety disorder: Secondary | ICD-10-CM | POA: Diagnosis not present

## 2023-04-28 DIAGNOSIS — F3340 Major depressive disorder, recurrent, in remission, unspecified: Secondary | ICD-10-CM
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
## 2023-05-03 ENCOUNTER — Ambulatory Visit: Admitting: Psychology

## 2023-05-03 DIAGNOSIS — F3340 Major depressive disorder, recurrent, in remission, unspecified: Secondary | ICD-10-CM

## 2023-05-03 DIAGNOSIS — F411 Generalized anxiety disorder: Secondary | ICD-10-CM
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
## 2023-05-05 ENCOUNTER — Ambulatory Visit: Payer: BC Managed Care – PPO | Admitting: Psychology

## 2023-05-12 ENCOUNTER — Ambulatory Visit: Payer: BC Managed Care – PPO | Admitting: Psychology

## 2023-05-12 DIAGNOSIS — F3340 Major depressive disorder, recurrent, in remission, unspecified: Secondary | ICD-10-CM | POA: Diagnosis not present

## 2023-05-12 DIAGNOSIS — F411 Generalized anxiety disorder: Secondary | ICD-10-CM
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
## 2023-05-16 ENCOUNTER — Ambulatory Visit (INDEPENDENT_AMBULATORY_CARE_PROVIDER_SITE_OTHER): Admitting: Psychology

## 2023-05-16 DIAGNOSIS — F3340 Major depressive disorder, recurrent, in remission, unspecified: Secondary | ICD-10-CM | POA: Diagnosis not present

## 2023-05-16 DIAGNOSIS — F411 Generalized anxiety disorder: Secondary | ICD-10-CM | POA: Diagnosis not present
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
## 2023-05-19 ENCOUNTER — Ambulatory Visit: Payer: BC Managed Care – PPO | Admitting: Psychology

## 2023-05-23 ENCOUNTER — Ambulatory Visit (INDEPENDENT_AMBULATORY_CARE_PROVIDER_SITE_OTHER): Admitting: Psychology

## 2023-05-23 DIAGNOSIS — F325 Major depressive disorder, single episode, in full remission: Secondary | ICD-10-CM

## 2023-05-23 DIAGNOSIS — F411 Generalized anxiety disorder: Secondary | ICD-10-CM
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
## 2023-05-26 ENCOUNTER — Ambulatory Visit: Payer: BC Managed Care – PPO | Admitting: Psychology

## 2023-05-26 DIAGNOSIS — F3340 Major depressive disorder, recurrent, in remission, unspecified: Secondary | ICD-10-CM

## 2023-05-26 DIAGNOSIS — F411 Generalized anxiety disorder: Secondary | ICD-10-CM | POA: Diagnosis not present
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
## 2023-06-02 ENCOUNTER — Ambulatory Visit: Payer: BC Managed Care – PPO | Admitting: Psychology

## 2023-06-02 DIAGNOSIS — F411 Generalized anxiety disorder: Secondary | ICD-10-CM
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
## 2023-06-09 ENCOUNTER — Ambulatory Visit: Payer: BC Managed Care – PPO | Admitting: Psychology

## 2023-06-16 ENCOUNTER — Ambulatory Visit: Payer: BC Managed Care – PPO | Admitting: Psychology

## 2023-06-23 ENCOUNTER — Ambulatory Visit: Payer: BC Managed Care – PPO | Admitting: Psychology

## 2023-06-30 ENCOUNTER — Ambulatory Visit: Payer: BC Managed Care – PPO | Admitting: Psychology

## 2023-07-07 ENCOUNTER — Ambulatory Visit: Payer: BC Managed Care – PPO | Admitting: Psychology

## 2023-07-07 DIAGNOSIS — F3340 Major depressive disorder, recurrent, in remission, unspecified: Secondary | ICD-10-CM

## 2023-07-07 DIAGNOSIS — F411 Generalized anxiety disorder: Secondary | ICD-10-CM
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
## 2023-07-14 ENCOUNTER — Ambulatory Visit: Payer: BC Managed Care – PPO | Admitting: Psychology

## 2023-07-21 ENCOUNTER — Ambulatory Visit: Admitting: Psychology

## 2023-07-21 DIAGNOSIS — F3341 Major depressive disorder, recurrent, in partial remission: Secondary | ICD-10-CM

## 2023-07-21 DIAGNOSIS — F411 Generalized anxiety disorder: Secondary | ICD-10-CM
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
## 2023-07-28 ENCOUNTER — Ambulatory Visit (INDEPENDENT_AMBULATORY_CARE_PROVIDER_SITE_OTHER): Admitting: Psychology

## 2023-07-28 DIAGNOSIS — F411 Generalized anxiety disorder: Secondary | ICD-10-CM

## 2023-07-28 DIAGNOSIS — F3340 Major depressive disorder, recurrent, in remission, unspecified: Secondary | ICD-10-CM | POA: Diagnosis not present
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
## 2023-08-04 ENCOUNTER — Ambulatory Visit: Admitting: Psychology

## 2023-08-11 ENCOUNTER — Ambulatory Visit (INDEPENDENT_AMBULATORY_CARE_PROVIDER_SITE_OTHER): Admitting: Psychology

## 2023-08-11 DIAGNOSIS — F411 Generalized anxiety disorder: Secondary | ICD-10-CM | POA: Diagnosis not present

## 2023-08-11 DIAGNOSIS — F3340 Major depressive disorder, recurrent, in remission, unspecified: Secondary | ICD-10-CM
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
## 2023-08-18 ENCOUNTER — Ambulatory Visit: Admitting: Psychology

## 2023-08-25 ENCOUNTER — Ambulatory Visit: Admitting: Psychology

## 2023-08-25 DIAGNOSIS — F411 Generalized anxiety disorder: Secondary | ICD-10-CM

## 2023-08-25 DIAGNOSIS — F3340 Major depressive disorder, recurrent, in remission, unspecified: Secondary | ICD-10-CM | POA: Diagnosis not present
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
## 2023-09-01 ENCOUNTER — Ambulatory Visit: Payer: Self-pay | Admitting: Psychology

## 2023-09-07 ENCOUNTER — Ambulatory Visit (INDEPENDENT_AMBULATORY_CARE_PROVIDER_SITE_OTHER): Payer: Self-pay | Admitting: Psychology

## 2023-09-07 DIAGNOSIS — F3340 Major depressive disorder, recurrent, in remission, unspecified: Secondary | ICD-10-CM

## 2023-09-07 DIAGNOSIS — F411 Generalized anxiety disorder: Secondary | ICD-10-CM | POA: Diagnosis not present
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
## 2023-09-08 ENCOUNTER — Ambulatory Visit: Payer: Self-pay | Admitting: Psychology

## 2023-09-12 ENCOUNTER — Ambulatory Visit (INDEPENDENT_AMBULATORY_CARE_PROVIDER_SITE_OTHER): Admitting: Psychology

## 2023-09-12 DIAGNOSIS — F3340 Major depressive disorder, recurrent, in remission, unspecified: Secondary | ICD-10-CM | POA: Diagnosis not present

## 2023-09-12 DIAGNOSIS — F411 Generalized anxiety disorder: Secondary | ICD-10-CM | POA: Diagnosis not present
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
## 2023-09-15 ENCOUNTER — Ambulatory Visit: Payer: Self-pay | Admitting: Psychology

## 2023-09-20 ENCOUNTER — Ambulatory Visit (INDEPENDENT_AMBULATORY_CARE_PROVIDER_SITE_OTHER): Admitting: Psychology

## 2023-09-20 DIAGNOSIS — F3340 Major depressive disorder, recurrent, in remission, unspecified: Secondary | ICD-10-CM | POA: Diagnosis not present

## 2023-09-20 DIAGNOSIS — F411 Generalized anxiety disorder: Secondary | ICD-10-CM | POA: Diagnosis not present
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
## 2023-09-22 ENCOUNTER — Ambulatory Visit: Admitting: Psychology

## 2023-09-27 ENCOUNTER — Ambulatory Visit: Admitting: Psychology

## 2023-09-27 DIAGNOSIS — F3340 Major depressive disorder, recurrent, in remission, unspecified: Secondary | ICD-10-CM | POA: Diagnosis not present

## 2023-09-27 DIAGNOSIS — F411 Generalized anxiety disorder: Secondary | ICD-10-CM
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
## 2023-09-29 ENCOUNTER — Ambulatory Visit: Admitting: Psychology

## 2023-10-04 ENCOUNTER — Ambulatory Visit (INDEPENDENT_AMBULATORY_CARE_PROVIDER_SITE_OTHER): Admitting: Psychology

## 2023-10-04 DIAGNOSIS — F3341 Major depressive disorder, recurrent, in partial remission: Secondary | ICD-10-CM

## 2023-10-04 DIAGNOSIS — F411 Generalized anxiety disorder: Secondary | ICD-10-CM | POA: Diagnosis not present

## 2023-10-04 DIAGNOSIS — F3340 Major depressive disorder, recurrent, in remission, unspecified: Secondary | ICD-10-CM
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
## 2023-10-06 ENCOUNTER — Ambulatory Visit: Admitting: Psychology

## 2023-10-10 ENCOUNTER — Ambulatory Visit: Admitting: Psychology

## 2023-10-10 DIAGNOSIS — F3340 Major depressive disorder, recurrent, in remission, unspecified: Secondary | ICD-10-CM | POA: Diagnosis not present

## 2023-10-10 DIAGNOSIS — F3341 Major depressive disorder, recurrent, in partial remission: Secondary | ICD-10-CM

## 2023-10-10 DIAGNOSIS — F411 Generalized anxiety disorder: Secondary | ICD-10-CM | POA: Diagnosis not present
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
## 2023-10-13 ENCOUNTER — Ambulatory Visit: Admitting: Psychology

## 2023-10-18 ENCOUNTER — Ambulatory Visit (INDEPENDENT_AMBULATORY_CARE_PROVIDER_SITE_OTHER): Admitting: Psychology

## 2023-10-18 DIAGNOSIS — F3340 Major depressive disorder, recurrent, in remission, unspecified: Secondary | ICD-10-CM | POA: Diagnosis not present

## 2023-10-18 DIAGNOSIS — F411 Generalized anxiety disorder: Secondary | ICD-10-CM

## 2023-10-18 DIAGNOSIS — F3341 Major depressive disorder, recurrent, in partial remission: Secondary | ICD-10-CM
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
## 2023-10-20 ENCOUNTER — Ambulatory Visit: Admitting: Psychology

## 2023-10-25 ENCOUNTER — Ambulatory Visit (INDEPENDENT_AMBULATORY_CARE_PROVIDER_SITE_OTHER): Admitting: Psychology

## 2023-10-25 DIAGNOSIS — F411 Generalized anxiety disorder: Secondary | ICD-10-CM

## 2023-10-25 DIAGNOSIS — F3341 Major depressive disorder, recurrent, in partial remission: Secondary | ICD-10-CM

## 2023-10-25 DIAGNOSIS — F3340 Major depressive disorder, recurrent, in remission, unspecified: Secondary | ICD-10-CM | POA: Diagnosis not present
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
## 2023-10-27 ENCOUNTER — Ambulatory Visit: Admitting: Psychology

## 2023-11-01 ENCOUNTER — Ambulatory Visit (INDEPENDENT_AMBULATORY_CARE_PROVIDER_SITE_OTHER): Admitting: Psychology

## 2023-11-01 DIAGNOSIS — F411 Generalized anxiety disorder: Secondary | ICD-10-CM | POA: Diagnosis not present

## 2023-11-01 DIAGNOSIS — F3340 Major depressive disorder, recurrent, in remission, unspecified: Secondary | ICD-10-CM | POA: Diagnosis not present
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
## 2023-11-03 ENCOUNTER — Ambulatory Visit: Admitting: Psychology

## 2023-11-08 ENCOUNTER — Ambulatory Visit (INDEPENDENT_AMBULATORY_CARE_PROVIDER_SITE_OTHER): Admitting: Psychology

## 2023-11-08 DIAGNOSIS — F3342 Major depressive disorder, recurrent, in full remission: Secondary | ICD-10-CM

## 2023-11-08 DIAGNOSIS — F411 Generalized anxiety disorder: Secondary | ICD-10-CM
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
## 2023-11-10 ENCOUNTER — Ambulatory Visit: Admitting: Psychology

## 2023-11-14 ENCOUNTER — Ambulatory Visit: Admitting: Psychology

## 2023-11-22 ENCOUNTER — Ambulatory Visit (INDEPENDENT_AMBULATORY_CARE_PROVIDER_SITE_OTHER): Admitting: Psychology

## 2023-11-22 DIAGNOSIS — F3340 Major depressive disorder, recurrent, in remission, unspecified: Secondary | ICD-10-CM | POA: Diagnosis not present

## 2023-11-22 DIAGNOSIS — F411 Generalized anxiety disorder: Secondary | ICD-10-CM | POA: Diagnosis not present
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
## 2023-11-29 ENCOUNTER — Ambulatory Visit: Admitting: Psychology

## 2023-11-30 ENCOUNTER — Ambulatory Visit (INDEPENDENT_AMBULATORY_CARE_PROVIDER_SITE_OTHER): Admitting: Psychology

## 2023-11-30 DIAGNOSIS — F411 Generalized anxiety disorder: Secondary | ICD-10-CM | POA: Diagnosis not present

## 2023-11-30 DIAGNOSIS — Z630 Problems in relationship with spouse or partner: Secondary | ICD-10-CM

## 2023-11-30 DIAGNOSIS — F3340 Major depressive disorder, recurrent, in remission, unspecified: Secondary | ICD-10-CM
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
## 2023-12-06 ENCOUNTER — Ambulatory Visit (INDEPENDENT_AMBULATORY_CARE_PROVIDER_SITE_OTHER): Admitting: Psychology

## 2023-12-06 DIAGNOSIS — F411 Generalized anxiety disorder: Secondary | ICD-10-CM | POA: Diagnosis not present

## 2023-12-06 DIAGNOSIS — F3340 Major depressive disorder, recurrent, in remission, unspecified: Secondary | ICD-10-CM
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
## 2023-12-20 ENCOUNTER — Ambulatory Visit (INDEPENDENT_AMBULATORY_CARE_PROVIDER_SITE_OTHER): Admitting: Psychology

## 2023-12-20 DIAGNOSIS — F3340 Major depressive disorder, recurrent, in remission, unspecified: Secondary | ICD-10-CM

## 2023-12-20 DIAGNOSIS — F411 Generalized anxiety disorder: Secondary | ICD-10-CM
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
## 2024-01-17 ENCOUNTER — Ambulatory Visit: Admitting: Psychology

## 2024-01-17 DIAGNOSIS — F3342 Major depressive disorder, recurrent, in full remission: Secondary | ICD-10-CM

## 2024-01-17 DIAGNOSIS — F411 Generalized anxiety disorder: Secondary | ICD-10-CM
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
## 2024-01-24 ENCOUNTER — Ambulatory Visit: Admitting: Psychology

## 2024-01-24 DIAGNOSIS — F411 Generalized anxiety disorder: Secondary | ICD-10-CM
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
## 2024-01-31 ENCOUNTER — Ambulatory Visit: Admitting: Psychology

## 2024-01-31 DIAGNOSIS — F3342 Major depressive disorder, recurrent, in full remission: Secondary | ICD-10-CM | POA: Diagnosis not present

## 2024-01-31 DIAGNOSIS — F411 Generalized anxiety disorder: Secondary | ICD-10-CM

## 2024-01-31 DIAGNOSIS — Z630 Problems in relationship with spouse or partner: Secondary | ICD-10-CM
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
## 2024-02-07 ENCOUNTER — Ambulatory Visit: Admitting: Psychology

## 2024-02-21 ENCOUNTER — Ambulatory Visit: Admitting: Psychology

## 2024-02-21 DIAGNOSIS — F411 Generalized anxiety disorder: Secondary | ICD-10-CM

## 2024-02-21 DIAGNOSIS — F3342 Major depressive disorder, recurrent, in full remission: Secondary | ICD-10-CM
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
## 2024-02-27 ENCOUNTER — Ambulatory Visit: Admitting: Psychology

## 2024-02-27 DIAGNOSIS — F3340 Major depressive disorder, recurrent, in remission, unspecified: Secondary | ICD-10-CM

## 2024-02-27 DIAGNOSIS — F411 Generalized anxiety disorder: Secondary | ICD-10-CM | POA: Diagnosis not present
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
## 2024-03-06 ENCOUNTER — Ambulatory Visit: Admitting: Psychology

## 2024-03-06 DIAGNOSIS — F411 Generalized anxiety disorder: Secondary | ICD-10-CM
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
## 2024-03-27 ENCOUNTER — Ambulatory Visit: Admitting: Psychology

## 2024-04-03 ENCOUNTER — Ambulatory Visit: Admitting: Psychology

## 2024-04-17 ENCOUNTER — Ambulatory Visit: Admitting: Psychology

## 2024-04-24 ENCOUNTER — Ambulatory Visit: Admitting: Psychology

## 2024-05-01 ENCOUNTER — Ambulatory Visit: Admitting: Psychology

## 2024-05-08 ENCOUNTER — Ambulatory Visit: Admitting: Psychology
